# Patient Record
Sex: Female | Born: 1955 | Race: White | Hispanic: No | Smoking: Never smoker
Health system: Southern US, Community
[De-identification: ages and names within clinical notes are randomized; demographics above are authoritative.]

## PROBLEM LIST (undated history)

## (undated) DIAGNOSIS — M810 Age-related osteoporosis without current pathological fracture: Secondary | ICD-10-CM

## (undated) DIAGNOSIS — T8859XA Other complications of anesthesia, initial encounter: Secondary | ICD-10-CM

## (undated) DIAGNOSIS — E890 Postprocedural hypothyroidism: Secondary | ICD-10-CM

## (undated) DIAGNOSIS — M5432 Sciatica, left side: Secondary | ICD-10-CM

## (undated) DIAGNOSIS — E785 Hyperlipidemia, unspecified: Secondary | ICD-10-CM

## (undated) DIAGNOSIS — Z87448 Personal history of other diseases of urinary system: Secondary | ICD-10-CM

## (undated) DIAGNOSIS — M5136 Other intervertebral disc degeneration, lumbar region: Secondary | ICD-10-CM

## (undated) DIAGNOSIS — J302 Other seasonal allergic rhinitis: Secondary | ICD-10-CM

## (undated) DIAGNOSIS — N201 Calculus of ureter: Secondary | ICD-10-CM

## (undated) DIAGNOSIS — Z923 Personal history of irradiation: Secondary | ICD-10-CM

## (undated) DIAGNOSIS — M51369 Other intervertebral disc degeneration, lumbar region without mention of lumbar back pain or lower extremity pain: Secondary | ICD-10-CM

## (undated) DIAGNOSIS — Z86010 Personal history of colonic polyps: Secondary | ICD-10-CM

## (undated) DIAGNOSIS — I10 Essential (primary) hypertension: Secondary | ICD-10-CM

## (undated) DIAGNOSIS — C801 Malignant (primary) neoplasm, unspecified: Secondary | ICD-10-CM

## (undated) DIAGNOSIS — Z8542 Personal history of malignant neoplasm of other parts of uterus: Secondary | ICD-10-CM

## (undated) DIAGNOSIS — T4145XA Adverse effect of unspecified anesthetic, initial encounter: Secondary | ICD-10-CM

## (undated) DIAGNOSIS — Z87442 Personal history of urinary calculi: Secondary | ICD-10-CM

## (undated) DIAGNOSIS — J45909 Unspecified asthma, uncomplicated: Secondary | ICD-10-CM

## (undated) DIAGNOSIS — N289 Disorder of kidney and ureter, unspecified: Secondary | ICD-10-CM

## (undated) HISTORY — PX: TOTAL ABDOMINAL HYSTERECTOMY W/ BILATERAL SALPINGOOPHORECTOMY: SHX83

## (undated) HISTORY — DX: Hyperlipidemia, unspecified: E78.5

## (undated) HISTORY — DX: Personal history of colonic polyps: Z86.010

## (undated) HISTORY — PX: TOTAL THYROIDECTOMY: SHX2547

## (undated) HISTORY — PX: TONSILLECTOMY: SUR1361

## (undated) HISTORY — DX: Other seasonal allergic rhinitis: J30.2

## (undated) HISTORY — DX: Personal history of irradiation: Z92.3

## (undated) HISTORY — PX: THYROIDECTOMY, PARTIAL: SHX18

## (undated) HISTORY — DX: Age-related osteoporosis without current pathological fracture: M81.0

---
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
## 1994-08-03 HISTORY — PX: DILATION AND CURETTAGE OF UTERUS: SHX78

## 2003-05-02 ENCOUNTER — Encounter: Payer: Self-pay | Admitting: Internal Medicine

## 2003-05-02 ENCOUNTER — Ambulatory Visit (HOSPITAL_COMMUNITY): Admission: RE | Admit: 2003-05-02 | Discharge: 2003-05-02 | Payer: Self-pay | Admitting: Internal Medicine

## 2004-07-10 ENCOUNTER — Encounter: Admission: RE | Admit: 2004-07-10 | Discharge: 2004-07-10 | Payer: Self-pay | Admitting: Internal Medicine

## 2008-10-14 ENCOUNTER — Encounter: Payer: Self-pay | Admitting: Emergency Medicine

## 2008-10-15 ENCOUNTER — Encounter (INDEPENDENT_AMBULATORY_CARE_PROVIDER_SITE_OTHER): Payer: Self-pay | Admitting: Obstetrics and Gynecology

## 2008-10-15 ENCOUNTER — Inpatient Hospital Stay (HOSPITAL_COMMUNITY): Admission: AD | Admit: 2008-10-15 | Discharge: 2008-10-15 | Payer: Self-pay | Admitting: Obstetrics & Gynecology

## 2008-11-07 ENCOUNTER — Ambulatory Visit: Admission: RE | Admit: 2008-11-07 | Discharge: 2008-11-07 | Payer: Self-pay | Admitting: Gynecologic Oncology

## 2008-11-27 ENCOUNTER — Encounter: Payer: Self-pay | Admitting: Gynecologic Oncology

## 2008-11-27 ENCOUNTER — Ambulatory Visit (HOSPITAL_COMMUNITY): Admission: RE | Admit: 2008-11-27 | Discharge: 2008-11-29 | Payer: Self-pay | Admitting: Obstetrics & Gynecology

## 2008-12-19 ENCOUNTER — Ambulatory Visit: Admission: RE | Admit: 2008-12-19 | Discharge: 2008-12-19 | Payer: Self-pay | Admitting: Gynecologic Oncology

## 2008-12-21 ENCOUNTER — Ambulatory Visit: Admission: RE | Admit: 2008-12-21 | Discharge: 2009-02-10 | Payer: Self-pay | Admitting: Radiation Oncology

## 2009-03-12 ENCOUNTER — Ambulatory Visit: Admission: RE | Admit: 2009-03-12 | Discharge: 2009-03-12 | Payer: Self-pay | Admitting: Radiation Oncology

## 2009-08-19 ENCOUNTER — Other Ambulatory Visit: Admission: RE | Admit: 2009-08-19 | Discharge: 2009-08-19 | Payer: Self-pay | Admitting: Radiation Oncology

## 2009-08-19 ENCOUNTER — Ambulatory Visit: Admission: RE | Admit: 2009-08-19 | Discharge: 2009-08-19 | Payer: Self-pay | Admitting: Radiation Oncology

## 2009-11-13 ENCOUNTER — Ambulatory Visit: Admission: RE | Admit: 2009-11-13 | Discharge: 2009-11-13 | Payer: Self-pay | Admitting: Gynecologic Oncology

## 2009-11-13 ENCOUNTER — Other Ambulatory Visit: Admission: RE | Admit: 2009-11-13 | Discharge: 2009-11-13 | Payer: Self-pay | Admitting: Gynecologic Oncology

## 2009-11-19 ENCOUNTER — Ambulatory Visit (HOSPITAL_COMMUNITY): Admission: RE | Admit: 2009-11-19 | Discharge: 2009-11-19 | Payer: Self-pay | Admitting: *Deleted

## 2009-11-20 ENCOUNTER — Ambulatory Visit (HOSPITAL_COMMUNITY): Admission: RE | Admit: 2009-11-20 | Discharge: 2009-11-20 | Payer: Self-pay | Admitting: *Deleted

## 2010-03-10 ENCOUNTER — Ambulatory Visit: Admission: RE | Admit: 2010-03-10 | Discharge: 2010-03-10 | Payer: Self-pay | Admitting: Radiation Oncology

## 2010-03-10 ENCOUNTER — Other Ambulatory Visit: Admission: RE | Admit: 2010-03-10 | Discharge: 2010-03-31 | Payer: Self-pay | Admitting: Radiation Oncology

## 2010-06-12 ENCOUNTER — Ambulatory Visit
Admission: RE | Admit: 2010-06-12 | Discharge: 2010-06-12 | Payer: Self-pay | Source: Home / Self Care | Admitting: Gynecologic Oncology

## 2010-09-09 ENCOUNTER — Ambulatory Visit: Payer: Self-pay | Attending: Radiation Oncology | Admitting: Radiation Oncology

## 2010-09-09 ENCOUNTER — Other Ambulatory Visit (HOSPITAL_COMMUNITY)
Admission: RE | Admit: 2010-09-09 | Discharge: 2010-09-09 | Disposition: A | Discharge status: Home / Self Care | Payer: Self-pay | Source: Ambulatory Visit | Attending: Radiation Oncology | Admitting: Radiation Oncology

## 2010-09-09 ENCOUNTER — Ambulatory Visit: Payer: Self-pay | Admitting: Radiation Oncology

## 2010-09-09 ENCOUNTER — Other Ambulatory Visit: Payer: Self-pay | Admitting: Radiation Oncology

## 2010-09-09 DIAGNOSIS — C549 Malignant neoplasm of corpus uteri, unspecified: Secondary | ICD-10-CM | POA: Insufficient documentation

## 2010-09-25 ENCOUNTER — Ambulatory Visit: Payer: Self-pay | Attending: Radiation Oncology | Admitting: Radiation Oncology

## 2010-09-25 DIAGNOSIS — Z923 Personal history of irradiation: Secondary | ICD-10-CM | POA: Insufficient documentation

## 2010-09-25 DIAGNOSIS — R21 Rash and other nonspecific skin eruption: Secondary | ICD-10-CM | POA: Insufficient documentation

## 2010-09-25 DIAGNOSIS — C549 Malignant neoplasm of corpus uteri, unspecified: Secondary | ICD-10-CM | POA: Insufficient documentation

## 2010-09-25 LAB — URINALYSIS, MICROSCOPIC - CHCC
Bilirubin (Urine): NEGATIVE
Glucose: NEGATIVE g/dL
Ketones: NEGATIVE mg/dL
Nitrite: NEGATIVE
Protein: 30 mg/dL
Specific Gravity, Urine: 1.02 (ref 1.003–1.035)
pH: 6 (ref 4.6–8.0)

## 2010-09-27 LAB — URINE CULTURE

## 2010-10-09 ENCOUNTER — Ambulatory Visit: Payer: Self-pay | Attending: Radiation Oncology | Admitting: Radiation Oncology

## 2010-10-21 LAB — CREATININE, SERUM
Creatinine, Ser: 0.96 mg/dL (ref 0.4–1.2)
GFR calc Af Amer: >60 mL/min (ref >60)
GFR calc non Af Amer: >60 mL/min (ref >60)

## 2010-11-12 LAB — COMPREHENSIVE METABOLIC PANEL
ALT: 25 U/L (ref 0–35)
AST: 47 U/L — ABNORMAL HIGH (ref 0–37)
Albumin: 4.2 g/dL (ref 3.5–5.2)
Alkaline Phosphatase: 68 U/L (ref 39–117)
BUN: 10 mg/dL (ref 6–23)
CO2: 27 mEq/L (ref 19–32)
Calcium: 10.5 mg/dL (ref 8.4–10.5)
Chloride: 107 mEq/L (ref 96–112)
Creatinine, Ser: 0.93 mg/dL (ref 0.4–1.2)
GFR calc Af Amer: >60 mL/min (ref >60)
GFR calc non Af Amer: >60 mL/min (ref >60)
Glucose, Bld: 127 mg/dL — ABNORMAL HIGH (ref 70–99)
Potassium: 3.4 mEq/L — ABNORMAL LOW (ref 3.5–5.1)
Sodium: 143 mEq/L (ref 135–145)
Total Bilirubin: 0.6 mg/dL (ref 0.3–1.2)
Total Protein: 6.7 g/dL (ref 6.0–8.3)

## 2010-11-12 LAB — CBC
HCT: 38.2 % (ref 36.0–46.0)
HCT: 39.1 % (ref 36.0–46.0)
HCT: 39.4 % (ref 36.0–46.0)
Hemoglobin: 13 g/dL (ref 12.0–15.0)
Hemoglobin: 13.1 g/dL (ref 12.0–15.0)
Hemoglobin: 13.3 g/dL (ref 12.0–15.0)
MCHC: 33.3 g/dL (ref 30.0–36.0)
MCHC: 34.1 g/dL (ref 30.0–36.0)
MCHC: 34.1 g/dL (ref 30.0–36.0)
MCV: 88.8 fL (ref 78.0–100.0)
MCV: 89.3 fL (ref 78.0–100.0)
MCV: 90.3 fL (ref 78.0–100.0)
Platelets: 251 10*3/uL (ref 150–400)
Platelets: 268 10*3/uL (ref 150–400)
Platelets: 281 10*3/uL (ref 150–400)
RBC: 4.28 MIL/uL (ref 3.87–5.11)
RBC: 4.36 MIL/uL (ref 3.87–5.11)
RBC: 4.4 MIL/uL (ref 3.87–5.11)
RDW: 14 % (ref 11.5–15.5)
RDW: 14.6 % (ref 11.5–15.5)
RDW: 14.8 % (ref 11.5–15.5)
WBC: 10.7 10*3/uL — ABNORMAL HIGH (ref 4.0–10.5)
WBC: 13.8 10*3/uL — ABNORMAL HIGH (ref 4.0–10.5)
WBC: 16 10*3/uL — ABNORMAL HIGH (ref 4.0–10.5)

## 2010-11-12 LAB — BASIC METABOLIC PANEL
BUN: 7 mg/dL (ref 6–23)
CO2: 25 mEq/L (ref 19–32)
Calcium: 10.1 mg/dL (ref 8.4–10.5)
Chloride: 102 mEq/L (ref 96–112)
Creatinine, Ser: 0.87 mg/dL (ref 0.4–1.2)
GFR calc Af Amer: >60 mL/min (ref >60)
GFR calc non Af Amer: >60 mL/min (ref >60)
Glucose, Bld: 122 mg/dL — ABNORMAL HIGH (ref 70–99)
Potassium: 4.1 mEq/L (ref 3.5–5.1)
Sodium: 137 mEq/L (ref 135–145)

## 2010-11-12 LAB — DIFFERENTIAL
Basophils Absolute: 0.1 10*3/uL (ref 0.0–0.1)
Basophils Relative: 1 % (ref 0–1)
Eosinophils Absolute: 0.2 10*3/uL (ref 0.0–0.7)
Eosinophils Relative: 2 % (ref 0–5)
Lymphocytes Relative: 31 % (ref 12–46)
Lymphs Abs: 3.3 10*3/uL (ref 0.7–4.0)
Monocytes Absolute: 0.6 10*3/uL (ref 0.1–1.0)
Monocytes Relative: 6 % (ref 3–12)
Neutro Abs: 6.5 10*3/uL (ref 1.7–7.7)
Neutrophils Relative %: 61 % (ref 43–77)

## 2010-11-12 LAB — TYPE AND SCREEN
ABO/RH(D): B POS
Antibody Screen: NEGATIVE

## 2010-11-12 LAB — ABO/RH: ABO/RH(D): B POS

## 2010-11-13 LAB — CBC
HCT: 32.8 % — ABNORMAL LOW (ref 36.0–46.0)
HCT: 34.9 % — ABNORMAL LOW (ref 36.0–46.0)
HCT: 40.8 % (ref 36.0–46.0)
Hemoglobin: 11 g/dL — ABNORMAL LOW (ref 12.0–15.0)
Hemoglobin: 11.9 g/dL — ABNORMAL LOW (ref 12.0–15.0)
Hemoglobin: 13.7 g/dL (ref 12.0–15.0)
MCHC: 33.6 g/dL (ref 30.0–36.0)
MCHC: 33.7 g/dL (ref 30.0–36.0)
MCHC: 34.1 g/dL (ref 30.0–36.0)
MCV: 89.6 fL (ref 78.0–100.0)
MCV: 90.1 fL (ref 78.0–100.0)
MCV: 90.1 fL (ref 78.0–100.0)
Platelets: 250 10*3/uL (ref 150–400)
Platelets: 258 10*3/uL (ref 150–400)
Platelets: 340 10*3/uL (ref 150–400)
RBC: 3.64 MIL/uL — ABNORMAL LOW (ref 3.87–5.11)
RBC: 3.88 MIL/uL (ref 3.87–5.11)
RBC: 4.55 MIL/uL (ref 3.87–5.11)
RDW: 14.6 % (ref 11.5–15.5)
RDW: 14.8 % (ref 11.5–15.5)
RDW: 14.8 % (ref 11.5–15.5)
WBC: 12.5 10*3/uL — ABNORMAL HIGH (ref 4.0–10.5)
WBC: 12.6 10*3/uL — ABNORMAL HIGH (ref 4.0–10.5)
WBC: 19.4 10*3/uL — ABNORMAL HIGH (ref 4.0–10.5)

## 2010-11-13 LAB — URINALYSIS, ROUTINE W REFLEX MICROSCOPIC
Bilirubin Urine: NEGATIVE
Glucose, UA: NEGATIVE mg/dL
Ketones, ur: NEGATIVE mg/dL
Nitrite: NEGATIVE
Protein, ur: 100 mg/dL — AB
Specific Gravity, Urine: 1.018 (ref 1.005–1.030)
Urobilinogen, UA: 0.2 mg/dL (ref 0.0–1.0)
pH: 7 (ref 5.0–8.0)

## 2010-11-13 LAB — WET PREP, GENITAL
Clue Cells Wet Prep HPF POC: NONE SEEN
Trich, Wet Prep: NONE SEEN
Yeast Wet Prep HPF POC: NONE SEEN

## 2010-11-13 LAB — BASIC METABOLIC PANEL
BUN: 10 mg/dL (ref 6–23)
CO2: 26 mEq/L (ref 19–32)
Calcium: 10.3 mg/dL (ref 8.4–10.5)
Chloride: 105 mEq/L (ref 96–112)
Creatinine, Ser: 0.91 mg/dL (ref 0.4–1.2)
GFR calc Af Amer: >60 mL/min (ref >60)
GFR calc non Af Amer: >60 mL/min (ref >60)
Glucose, Bld: 128 mg/dL — ABNORMAL HIGH (ref 70–99)
Potassium: 3.7 mEq/L (ref 3.5–5.1)
Sodium: 142 mEq/L (ref 135–145)

## 2010-11-13 LAB — URINE MICROSCOPIC-ADD ON

## 2010-11-13 LAB — GC/CHLAMYDIA PROBE AMP, GENITAL
Chlamydia, DNA Probe: NEGATIVE
GC Probe Amp, Genital: NEGATIVE

## 2010-11-13 LAB — TSH: TSH: 92.431 u[IU]/mL — ABNORMAL HIGH (ref 0.350–4.500)

## 2010-11-13 LAB — T4, FREE: Free T4: 0.57 ng/dL — ABNORMAL LOW (ref 0.89–1.80)

## 2010-11-13 LAB — HCG, SERUM, QUALITATIVE: Preg, Serum: NEGATIVE

## 2010-11-13 LAB — T3, FREE: T3, Free: 1.9 pg/mL — ABNORMAL LOW (ref 2.3–4.2)

## 2010-12-16 NOTE — Op Note (Signed)
Sheila Leach, Sheila Leach                  ACCOUNT NO.:  [id number]   MEDICAL RECORD NO.:  [id number]          PATIENT TYPE:  OIB   LOCATION:  69                         FACILITY:  [facility]   PHYSICIAN:  Paola A. Alycia Rossetti, MD    DATE OF BIRTH:  1956-02-24   DATE OF PROCEDURE:  11/27/2008  DATE OF DISCHARGE:                               OPERATIVE REPORT   PREOPERATIVE DIAGNOSIS:  Grade 1 endometrial carcinoma.   POSTOPERATIVE DIAGNOSIS:  Grade 1 endometrial carcinoma.   PROCEDURES:  Total laparoscopic hysterectomy, bilateral salpingo-  oophorectomy, bilateral pelvic lymph node sampling, peritoneal biopsy.   SURGEONS:  Paola A. Alycia Rossetti, M.D. and Agnes Lawrence, M.D.   ASSISTANT:  Caswell Corwin, R.N.   ANESTHESIA:  General.   ANESTHESIOLOGIST:  Dr. Landry Dyke.   ESTIMATED BLOOD LOSS:  150 mL.   URINE OUTPUT:  150 mL.   IV FLUIDS:  2200 mL.   OPERATIVE FINDINGS:  Included inflammatory exudate on the posterior  surface of the uterus with some filmy adhesions to the ovaries and the  rectosigmoid colon.  The rectosigmoid colon was adherent to the left  pelvic sidewall and the ovarian fossa.  Frozen section revealed grade 1  endometrioid adenocarcinoma with 100% myometrial invasion and  angiolymphatic involvement.   SPECIMENS:  Washings, cervix, bilateral tubes and ovaries, uterus, and  bilateral pelvic lymph nodes.   The patient was taken to the operating room and placed in the supine  position.  While she was awake, her arms were tucked at her side with  all appropriate precautions to her comfort level.  SCDs were placed.  General anesthesia was then induced.  Time-out was performed to confirm  the patient, the procedure, antibiotics, and allergy status.  The  abdomen was prepped in the usual fashion.  The perineum and vagina were  prepped in the usual fashion.  A Foley catheter was inserted into the  bladder under sterile conditions.  Sterile speculum was inserted into  the  vagina.  The cervix was grasped with a single-tooth tenaculum.  The  cervix was dilated without difficulty.  The ZUMI made with a medium KOH  ring was placed in the usual fashion.  The patient was then draped.   0.25% Marcaine, total of 7 mL was used.  However, for the supraumbilical  port site, 1 mL of 0.25% Marcaine was injected.  A vertical  supraumbilical incision was made with a knife and using the Optiview  port, we entered the abdominal cavity.  The abdomen was then insufflated  with CO2 gas.  Initially our attempts at Trendelenburg were not  successful due to the significant de-oxygenation and desaturation and  difficulty ventilating the patient.  She was then placed out of  Trendelenburg and placed in the supine position again.  The  Trendelenburg was reversed and then slowly Trendelenburg was re-  instituted.  Bilateral 5-mm ports were placed 2 cm medial to where the  ACIS was felt to be.  We could not palpate it due to morbid obesity.  A  10/12 suprapubic port was then placed in her previous  C-section  incision.  The abdomen was insufflated.  We did not increase peak  pressures over 13 mmHg.  Abdominopelvic washings were obtained.  The  inflammatory reaction as described above was noted.  A peritoneal biopsy  was obtained and sent for frozen section.  It returned as reactive  mesothelium, no evidence of malignancy.   Our attention was first drawn to the left pelvic sidewall.  The adhesive  disease of the rectosigmoid colon to the left pelvic sidewall and the  left ovary was taken down using sharp dissection.  The round ligament  was then transected with monopolar cautery and the anterior and  posterior leaves of the broad ligament were opened.  The ureter was  identified.  A window was made between the IP and the ureter.  The IP  was coagulated and transected with the EnSeal device.  We then  skeletonized the uterine artery and created a bladder flap anteriorly on  the  patient's left side.  The uterine vessels were coagulated, but not  transected.   Our attention was then drawn to the patient's right side.  In a similar  fashion, the round ligament was transected with monopolar cautery and  the anterior and posterior leaves of the broad ligament were opened.  The bladder flap was created anteriorly so we were well below the level  of the KOH ring.  We went posteriorly with the round ligament.  The  ureter was identified.  A window was made between the IP and the ureter.  The IP was coagulated and transected with the EnSeal device.  In a  similar fashion to the patient's left side, we skeletonized the uterine  vessels on the patient's right side.  They were coagulated and  transected with the EnSeal.  There was some bleeding.  Therefore, the  Kleppinger was used for adequate hemostasis.   We then turned our attention again to the left side.  We re-coagulated  the uterine vessels with a Kleppinger and the uterine vessels were  transected and a C-loop was created.  The Pneumo Occluder balloon was  then insufflated.  The colpotomy was created and we continued around the  entire colpotomy ring until the specimen was delivered through the  vagina.  It was sent for frozen section.  The vaginal cuff was closed  using four sutures in figure of fashion of 0 Vicryl on an Endostitch.   The patient's bilateral pelvis, paravesical, and pararectal spaces were  opened in the usual fashion.  Frozen section returned as 100% invasion  with angiolymphatic involvement.  We would not be able to perform the  periaortic lymph nodes due to morbid obesity, difficulty with  Trendelenburg, and visualization.  Therefore, we attempted a pelvic  lymph node sampling.  As stated above, the pelvic spaces were opened.  Our attention was first drawn to the patient's left pelvic sidewall.  The nodal bundle extending over the external iliac artery was taken.  Care was ensured to spare  the genitofemoral nerve.  We continued down  inferiorly just to the level of the bifurcation.  Due to visualization  and lack of Trendelenburg, we could not go up to halfway on the common.  The superior vesical space was then held open with a grasper.  We  continued down to the obturator space.  The obturator nerve was  identified and the nodal bundle superior to this was taken using  monopolar cautery.  The specimen was then placed in an EndoCatch bag and  delivered.  A similar procedure was performed on the patient's right  side.  Hemostasis was obtained with pinpoint cautery.  The abdomen and  pelvis were copiously irrigated.  There was noted to be no active  bleeding.  The pedicle sites were inspected under low flow and there was  no bleeding.   The CO2 gas was removed from the patient's abdomen.  All trocars were  removed.  Trocar, Ray-Tec, and instrument counts were correct.  A deep  suture of 0 Vicryl on a UR6 was used to close the fascia of the  supraumbilical port site.  We reapproximated the deep tissues in the  suprapubic site.  The skin was closed using 4-0 Vicryl in a subcuticular  fashion.  Steri-Strips and Benzoin were applied, as were Band-Aids.   The patient tolerated the procedure well.  The Pneumo Occluder balloon  was removed from the vagina.  We swabbed the vagina and there was no  active bleeding.  She tolerated the procedure well and was taken to the  recovery room in stable condition.  All instrument, needle, and Ray-Tec  counts were correct x2.      Paola A. Alycia Rossetti, MD  Electronically Signed     PAG/MEDQ  D:  11/27/2008  T:  11/27/2008  Job:  [id number]   cc:   Caswell Corwin, R.N.  [address]  [place], [place] [address]   Everett Graff, M.D.  Fax: [phone]   Agnes Lawrence, M.D.  Fax: [phone]

## 2010-12-16 NOTE — Op Note (Signed)
[name], Sheila Leach                  ACCOUNT NO.:  [id number]   MEDICAL RECORD NO.:  [id number]          PATIENT TYPE:  INP   LOCATION:  9309                          FACILITY:  [facility]   PHYSICIAN:  Everett Graff, M.D.   DATE OF BIRTH:  1956-02-03   DATE OF PROCEDURE:  10/15/2008  DATE OF DISCHARGE:  10/15/2008                               OPERATIVE REPORT   PREPROCEDURE DIAGNOSES:  1. Postmenopausal bleeding.  2. Anemia.  3. Hypothyroidism.  4. Hypertension.   POSTPROCEDURE DIAGNOSES:  1. Postmenopausal bleeding.  2. Anemia.  3. Hypothyroidism.  4. Hypertension.   PROCEDURE:  Endometrial biopsy.   PROCEDURE IN DETAIL:  Ms. [name] is a 55 year old who presents with  postmenopausal bleeding and suspicion of precancerous or cancerous  lesions in the endometrium on ultrasound.  The patient was transferred  to [facility] from [place].  After discussing with the  patient, the risks, benefits, and alternatives of an endometrial biopsy,  the patient consented to proceed with an endometrial biopsy for further  diagnosis.  A speculum was placed in the patient's vagina and there was  active mild-to-moderate bleeding noted.  No masses visible.  The cervix  was prepped with a non-Betadine solution as the patient has an IODINE  allergy, and the anterior lip of the cervix was grasped with a single-  tooth tenaculum.  The Pipelle was placed sounding the uterus to  approximately the mass 8.5 to 9 cm and biopsy performed.  I was only  able to do one pass as the patient started to significantly bleed.  Pressure was held and an observation was performed in order to assure  that the patient was not going to continuously actively bleed.  The  bleeding did slow to its previous moderate amount.  The patient was  additionally given Provera in order to help decrease the bleeding.  All  instruments were removed.  A pelvic exam was performed and uterus was  approximately 10 weeks' size and no  palpable masses and mildly tender.  The patient tolerated the procedure well.      Everett Graff, M.D.  Electronically Signed     AR/MEDQ  D:  10/16/2008  T:  10/16/2008  Job:  [id number]

## 2010-12-16 NOTE — Consult Note (Signed)
NAMEGILDA, Sheila Leach                  ACCOUNT NO.:  [id number]   MEDICAL RECORD NO.:  [id number]          PATIENT TYPE:  OUT   LOCATION:  GYN                          FACILITY:  [facility]   PHYSICIAN:  Paola A. Alycia Rossetti, MD    DATE OF BIRTH:  [birth year]   DATE OF CONSULTATION:  12/19/2008  DATE OF DISCHARGE:                                 CONSULTATION   Sheila Leach is a 55 year old who had a preoperative endometrial biopsy  showing grade 1 endometrioid adenocarcinoma after a 5-day episode of  heavy bleeding.  She subsequently was counseled by Dr. Doug Sou to proceed  with laparoscopic surgery.  Subsequently, on November 27, 2008, she  underwent total laparoscopic hysterectomy, bilateral salpingo-  oophorectomy, bilateral pelvic lymph node sampling, and peritoneal  biopsies by myself and Dr. Delsa Sale and findings included  inflammatory exudate on the posterior surface of the uterus with some  filmy adhesions to the ovaries and rectosigmoid colon.  The rectosigmoid  colon was adherent to the left pelvic sidewall and the ovarian fossa.  Frozen section was a grade 1 endometrioid adenocarcinoma with 100%  myometrial invasion and angiolymphatic involvement.  We proceeded with  pelvic lymphadenectomy, did not proceed with periaortic lymphadenectomy,  but due to her significant obesity with a BMI of greater than 41 we  opted to not proceed with a laparotomy for periaortics.  Final pathology  revealed negative washings.  Within the uterus itself, she had a grade 1  endometrioid adenocarcinoma invading into the outer half of the  myometrium 1.4/2.4 cm in thickness.  The cervix was negative, as were  the tubes and ovaries.  The perineal biopsy adhesions revealed benign  fibrotic tissue with hemosiderin deposition.  Zero out of 8 lymph nodes  were involved.  Within the uterus, her risk factors were greater than  50%.  Myometrial invasion was 1.4/2.4 cm.  There was lymphovascular  space involvement.  She  comes in today for her postoperative check.  She  is overall doing fairly well.  She states she is getting stronger and  stronger but is not quite ready to go back to work.  She did have pain  at her right lower quadrant incision for about a week and a half.  She  states she popped one of the stitches.  She also complained of some  fatigue and just not feeling very well with some joint pains after her  Lovenox injections.  She subsequently has stopped her Lovenox injections  and is feeling better.  She denies any vaginal bleeding, any significant  change in her bowel or bladder habits, any nausea or vomiting.  She has  lost approximately 13 pounds compared to her preop visit but she states  that even though she is eating okay her appetite just has not been as  great as it was pre surgery.   PHYSICAL EXAMINATION:  272 pounds, blood pressure 144/98.  Well-  nourished, well-developed female in no acute distress.  ABDOMEN:  She has well-healed laparoscopy skin incisions.  There is very  minimal separation of the right lower quadrant port  site incision.  There is no erythema.  There is no exudate.  There is no induration.  PELVIC:  External genitalia is within normal limits.  The vaginal cuff  is difficult to visualize secondary to habitus and patient's voluntary  guarding, however, the cuff appears to be healing well.  On bimanual  examination, there is no cuff tenderness, fluctuance, or masses.   ASSESSMENT:  A 55 year old with a FIGO IB, new FIGO staging grade 1  endometrioid adenocarcinoma.  Based on risk factors of greater than 50%  myometrial invasion lymphovascular space involvement, I would recommend  that we proceed with vaginal cuff brachytherapy.  Discussed this with  her and she is amenable to proceeding as such.  She is also amenable to  [place] scheduling her an appointment to see Dr. Sondra Come for consideration of  vaginal cuff HDR.  Questions regarding this were elicited and  answered  to her satisfaction.  She does understand that she is not  completely staged as periaortics were not done due to technical  limitations.  We will proceed with a CT scan of the abdomen and pelvis  to rule out any lymphadenopathy higher than the pelvic region as she was  not comprehensively staged.      Paola A. Alycia Rossetti, MD  Electronically Signed     PAG/MEDQ  D:  12/19/2008  T:  12/19/2008  Job:  [id number]   cc:   Caswell Corwin, R.N.  [address]  [place], [place] [address]   Everett Graff, M.D.  Fax: [phone]   Blair Promise, M.D.  Fax: [phone]

## 2010-12-16 NOTE — Consult Note (Signed)
NAMESMT, STIDAM                  ACCOUNT NO.:  [id number]   MEDICAL RECORD NO.:  [id number]          PATIENT TYPE:  OUT   LOCATION:  GYN                          FACILITY:  [facility]   PHYSICIAN:  Janie Morning, MD     DATE OF BIRTH:  07/27/1956   DATE OF CONSULTATION:  11/07/2008  DATE OF DISCHARGE:                                 CONSULTATION   REASON FOR CONSULTATION:  Ms. Rash is referred by Everett Graff, M.D.  for evaluation of endometrial cancer.   HISTORY OF PRESENT ILLNESS:  This is a 55 year old who is menopausal 2  years ago who reported 5 days of heavy vaginal bleeding.  Endometrial  biopsy was performed on October 15, 2008, was consistent with a grade 1  endometrioid adenocarcinoma.  She was placed on adjuvant Provera and  reports only intermittent spotting since.  At this time she denies any  shortness of breath, nausea, vomiting, abdominal pain, fever, chills,  chest pain, headache or cough.  There are no changes in her bowel or  rectal habits.   PAST MEDICAL HISTORY:  Hypertension 2 to 3 years' duration.   PAST SURGICAL HISTORY:  Thyroidectomy at age of 60 because of a goiter.  Cesarean section in 1988 because of an inadequate pelvis and a dilation  and curettage and laparoscopy in 1996.   FAMILY HISTORY:  Notable for a brother with stage IV squamous cell  carcinoma of the throat.   SOCIAL HISTORY:  She has been divorced for over 20 years.  Her company  called [employer] was closed and so she is currently unemployed.  She denies any tobacco or alcohol use.   SCREENING HISTORY:  Mammogram 2 years ago.  Colonoscopy never.   MEDICATIONS:  Unspecified antihypertensive agent.   REVIEW OF SYSTEMS:  10-point review of systems is noteworthy for the  factors noted above.   PHYSICAL EXAMINATION:  VITAL SIGNS:  Weight 283 pounds, height 5 feet 9-  1/2 inches, BMI 41.19, blood pressure 189/107.  GENERAL:  This is a well-developed, very pleasant female in no acute  distress.  CHEST:  Clear to auscultation bilaterally.  BACK:  No CVA tenderness.  LYMPH NODE SURVEY:  No cervical, inguinal or supraclavicular adenopathy.  ABDOMEN:  Soft, obese.  Incision in the periumbilical area.  No palpable  masses.  PELVIC:  The cervix is very well supported with no descensus.  It is  palpably normal.  Trace blood was noted within the vaginal vault.  External genitalia:  There is asymmetry within the labia, namely the  right labia is much larger than the left.  Skin tags are noted on the  buttocks.  RECTAL:  Good anal sphincter tone without any rectal masses.   IMPRESSION:  1. Grade 1 endometrioid adenocarcinoma.  I counseled Ms. Penas that the      next recommended step would be surgical removal of the uterus,      preferably a laparoscopic hysterectomy, bilateral salpingo-      oophorectomy with possible lymph node sampling.  She is aware that  it may require conversion to an open procedure.  The risks and      benefits of the surgery discussed with the patient were that of      infection, bleeding, damage to surrounding structures, prolonged      hospitalization, reoperation.  2. Hypertension.  Ms. Sory did not take her medications this morning      and reports white coat syndrome.  She has been advised to take her      medications as specified.   PLAN:  For surgical staging with Paola A. Alycia Rossetti, MD      Janie Morning, MD  Electronically Signed     WB/MEDQ  D:  11/07/2008  T:  11/07/2008  Job:  [id number]   cc:   Everett Graff, M.D.  Fax: [phone]   Caswell Corwin, R.N.  [address], [place] [address]

## 2010-12-16 NOTE — H&P (Signed)
NAME:  Sheila Leach, Sheila Leach                  ACCOUNT NO.:  [id number]   MEDICAL RECORD NO.:  [id number]           PATIENT TYPE:   LOCATION:                                 FACILITY:   PHYSICIAN:  Everett Graff, M.D.   DATE OF BIRTH:  July 28, 1956   DATE OF ADMISSION:  DATE OF DISCHARGE:                              HISTORY & PHYSICAL   This is a 55 year old gravida 1, para 1, nonpregnant who presents from  the emergency room with vaginal bleeding.  She is 2 years postmenopausal  with a history of 2 episodes of spotting over the past 2 years and the  current episode of heavy bleeding beginning 1 week ago with passage of  clots.  Bleeding is heavier when she is up and moving around.  She does  not have pain with this bleeding.  She does not have fever or other  symptoms.   Her history is remarkable for a goiter and subsequent thyroidectomy.  She also has a history of hypertension and fibroids and is allergic to  IODINE.   PAST MEDICAL HISTORY:  Remarkable for goiter at age 34 with subsequent  thyroidectomy and subsequent hypothyroidism.  She also has a history of  hypertension which has not been treated.  She has a history of multiple  fibroids removed in the past and an allergy of IODINE.  She also has a  history of 1 vaginal delivery with no complications.   PAST SURGICAL HISTORY:  Remarkable for thyroidectomy, myomectomy, and D  and C.   FAMILY HISTORY:  Noncontributory.   GENETIC HISTORY:  Noncontributory.   SOCIAL HISTORY:  The patient lives with son who is supportive.  She does  not report a religious affiliation.  She denies any alcohol, tobacco, or  drug use.   OBJECTIVE DATA:  VITAL SIGNS:  Stable except for blood pressure 146/107.  HEENT:  Within normal limits.  THYROID:  Surgically absent with no nodules.  CHEST:  Clear to auscultation.  HEART:  Regular rate and rhythm.  ABDOMEN:  Soft and nontender.  GU:  Speculum exam shows vaginal bleeding with clots.  PELVIC:  An  enlarged uterus with ovaries not palpable secondary to  habitus.  Cervix is not able to be reached.  EXTREMITIES:  Within normal limits.   CBC is remarkable for hemoglobin earlier in the day of 13.7 with later  measurement of 11.9, and platelets are 258.  Thyroid studies are  pending.  Endometrial biopsy was done and is pending.  Ultrasound was  done in the emergency room showing a thickened endometrium suspicious  for carcinoma of the endometrial lining.   ASSESSMENT:  Abnormal postmenopausal bleeding.   PLAN:  Dr. Mancel Bale plans to admit the patient overnight for control of  her bleeding with progesterone and further orders to follow.      Marie L. Williams, C.N.M.      Everett Graff, M.D.  Electronically Signed    MLW/MEDQ  D:  10/15/2008  T:  10/15/2008  Job:  [id number]

## 2010-12-16 NOTE — Discharge Summary (Signed)
NAMEILLYRIA, FILIPE                  ACCOUNT NO.:  [id number]   MEDICAL RECORD NO.:  [id number]          PATIENT TYPE:  INP   LOCATION:  9309                          FACILITY:  [facility]   PHYSICIAN:  Everett Graff, M.D.   DATE OF BIRTH:  1956-02-17   DATE OF ADMISSION:  10/15/2008  DATE OF DISCHARGE:  10/15/2008                               DISCHARGE SUMMARY   DISCHARGE DIAGNOSES:  Postmenopausal bleeding; endometrioid  adenocarcinoma, FIGO grade I/III.   PROCEDURE:  On the date of admission, the patient underwent an  endometrial biopsy tolerating procedure well.   HISTORY OF PRESENT ILLNESS:  Ms. Beranek is a 55 year old female para 1-0-0-  1, who presented to [facility] Emergency Department for postmenopausal  bleeding with clots of 1-week duration.  The patient was subsequently  transferred to [place] for further evaluation.  Please see the patient's dictated history and physical examination for  details.  Preoperative physical exam, blood pressure 146/107.  General  exam was within normal limits, though now patient's thyroid was  surgically absent and there were no palpable nodules.  Pelvic exam  revealed via speculum, vaginal bleeding with clots, uterus appeared  enlarged with ovaries which were not palpable due to the patient's body  habitus.  Cervix also was not able to be reached.   HOSPITAL COURSE:  On the day of admission, the patient underwent  aforementioned procedure tolerating it well.  Post-procedure hemoglobin  was 11 and hematocrit 32.8 (pre-procedure hemoglobin 11.9 and hematocrit  34.9).  The patient was discharged following procedure to home in good  condition and advised to follow up with Dr. Mancel Bale in 2 weeks.   DISCHARGE MEDICATIONS:  1. Provera 10 mg 1 tablet twice daily.  2. Iron 325 mg 1 tablet twice daily.   FOLLOWUP:  The patient is to call [facility] to schedule a  2 weeks followup with Dr. Mancel Bale at [phone].   DISCHARGE  INSTRUCTIONS:  The patient was advised to call us for any  problems.   ACTIVITY:  Without restrictions.   DIET:  Low-sodium, low-carbohydrate diet.   FINAL PATHOLOGY:  Endometrium, biopsy:  Endometrioid adenocarcinoma FIGO  grade I/III.      [place] J. Elizebeth Koller.      Everett Graff, M.D.  Electronically Signed    EJP/MEDQ  D:  11/02/2008  T:  11/02/2008  Job:  [id number]

## 2010-12-19 NOTE — Discharge Summary (Signed)
Sheila Leach, Sheila Leach                  ACCOUNT NO.:  [id number]   MEDICAL RECORD NO.:  [id number]          PATIENT TYPE:  OIB   LOCATION:  [id number]                         FACILITY:  [facility]   PHYSICIAN:  Agnes Lawrence, M.D.DATE OF BIRTH:  05/23/1956   DATE OF ADMISSION:  11/27/2008  DATE OF DISCHARGE:  11/29/2008                               DISCHARGE SUMMARY   CHIEF COMPLAINT:  The patient is a 55 year old who presents for  operative management of a recently diagnosed endometrial cancer.  Please  see the dictated history and physical as per Dr. Janie Morning for  further details.   HOSPITAL COURSE:  The the patient was admitted and underwent a total  laparoscopic hysterectomy, bilateral salpingo-oophorectomy, bilateral  pelvic lymph node sampling, and peritoneal biopsy.  Please see the  dictated operative summary.  The patient's postoperative course was  uneventful.  She was tolerating a regular diet on postoperative day 1.  Her labs on postoperative day 1 were normal including a basic metabolic  profile and a hemoglobin of 13.  As the patient was under insured and  had limited financial resources, the discharge was deferred until  postoperative day 2 until a care coordinator arranged for the  perioperative Lovenox for DVT prophylaxis to be continued on an  outpatient basis.   DISCHARGE DIAGNOSES:  Stage I B endometrioid carcinoma.  The Federation  of International of Gynecologists and Obstetricians grade for the tumor  is a grade 1.   PROCEDURES:  Total laparoscopic hysterectomy, bilateral salpingo-  oophorectomy, bilateral pelvic lymph node sampling and peritoneal  biopsy.   CONDITION:  Stable.   DIET:  Regular.   ACTIVITY:  Pelvic rest, progressive activity.   MEDICATIONS:  Percocet 1-2 tablets every 6 hours as needed, Lovenox 60  mg subcu daily for 3 weeks.   DISPOSITION:  The patient is to follow up in the GYN oncology office.      Agnes Lawrence, M.D.  Electronically Signed     LAJ/MEDQ  D:  11/30/2008  T:  11/30/2008  Job:  [id number]   cc:   Caswell Corwin, R.N.  [address]  [place], [place] [address]   Everett Graff, M.D.  Fax: [phone]

## 2011-03-16 ENCOUNTER — Ambulatory Visit
Admission: RE | Admit: 2011-03-16 | Discharge: 2011-03-16 | Disposition: A | Discharge status: Home / Self Care | Payer: Self-pay | Source: Ambulatory Visit | Attending: Radiation Oncology | Admitting: Radiation Oncology

## 2011-03-17 ENCOUNTER — Other Ambulatory Visit: Payer: Self-pay | Admitting: Radiation Oncology

## 2011-03-17 ENCOUNTER — Other Ambulatory Visit (HOSPITAL_COMMUNITY)
Admission: RE | Admit: 2011-03-17 | Discharge: 2011-03-17 | Disposition: A | Discharge status: Home / Self Care | Payer: Self-pay | Source: Ambulatory Visit | Attending: Radiation Oncology | Admitting: Radiation Oncology

## 2011-03-17 DIAGNOSIS — C549 Malignant neoplasm of corpus uteri, unspecified: Secondary | ICD-10-CM | POA: Insufficient documentation

## 2012-03-02 ENCOUNTER — Ambulatory Visit
Admission: RE | Admit: 2012-03-02 | Discharge: 2012-03-02 | Disposition: A | Discharge status: Home / Self Care | Payer: Medicare Other | Source: Ambulatory Visit | Attending: Radiation Oncology | Admitting: Radiation Oncology

## 2012-03-02 ENCOUNTER — Encounter: Payer: Self-pay | Admitting: Radiation Oncology

## 2012-03-02 ENCOUNTER — Other Ambulatory Visit (HOSPITAL_COMMUNITY)
Admission: RE | Admit: 2012-03-02 | Discharge: 2012-03-02 | Disposition: A | Discharge status: Home / Self Care | Payer: Medicare Other | Source: Ambulatory Visit | Attending: Radiation Oncology | Admitting: Radiation Oncology

## 2012-03-02 VITALS — BP 135/96 | HR 54 | Temp 98.0°F | Resp 18 | Wt 274.9 lb

## 2012-03-02 DIAGNOSIS — C549 Malignant neoplasm of corpus uteri, unspecified: Secondary | ICD-10-CM | POA: Insufficient documentation

## 2012-03-02 DIAGNOSIS — Z124 Encounter for screening for malignant neoplasm of cervix: Secondary | ICD-10-CM | POA: Insufficient documentation

## 2012-03-02 HISTORY — DX: Malignant (primary) neoplasm, unspecified: C80.1

## 2012-03-02 HISTORY — DX: Unspecified asthma, uncomplicated: J45.909

## 2012-03-02 HISTORY — DX: Essential (primary) hypertension: I10

## 2012-03-02 NOTE — Progress Notes (Signed)
  Radiation Oncology         [phone] ________________________________  Name: Sheila Leach MRN: [id number]  Date: 03/02/2012  DOB: [birth year]  Follow-Up Visit Note  CC: No primary provider on file.  No ref. provider found  Diagnosis:   Stage IB endometrioid adenocarcinoma  Interval Since Last Radiation: 3 years, the patient completed intracavitary brachii therapy alone as part of her management.  Narrative:  The patient returns today for routine follow-up.  She has been placed on permanent disability related to her back problems. She missed her followup appointment in the winter with Dr. Alycia Rossetti. I recommend she follow with Dr. Alycia Rossetti in approximately 6 months.  She continues to use her vaginal dilator. She denies any vaginal bleeding or pelvic pain. The patient occasionally will have discomfort in the right groin area which is been a chronic problem for her.                              ALLERGIES:   has no allergies on file.  Meds: Current Outpatient Prescriptions  Medication Sig Dispense Refill  . aspirin 81 MG chewable tablet Chew 81 mg by mouth daily.      Marland Kitchen levothyroxine (SYNTHROID, LEVOTHROID) 200 MCG tablet Take 200 mcg by mouth daily.      Marland Kitchen lisinopril (PRINIVIL,ZESTRIL) 40 MG tablet Take 40 mg by mouth daily.      . Multiple Vitamins-Minerals (MULTIVITAMIN WITH MINERALS) tablet Take 1 tablet by mouth daily.        Physical Findings: The patient is in no acute distress. Patient is alert and oriented.  weight is 274 lb 14.4 oz (124.694 kg). Her oral temperature is 98 F (36.7 C). Her blood pressure is 135/96 and her pulse is 54. Her respiration is 18. Marland Kitchen  No palpable cervical supraclavicular or axillary adenopathy. The lungs are clear to auscultation. The heart has a regular rhythm and rate. The abdomen is soft and nontender with normal bowel sounds. There is no inguinal adenopathy appreciated.  On pelvic examination the external genitalia are unremarkable. A small speculum is  used for the exam. The vaginal vault is quite narrowed and elongated taking exam difficult. A Pap smear was obtained of the proximal vagina. There are no mucosal lesions noted on exam. On bimanual and rectovaginal examination there are no obvious pelvic masses although exam is compromised in light of the patient's body habitus.  Lab Findings: Lab Results  Component Value Date   WBC 13.8* 11/28/2008   HGB 13.0 11/28/2008   HCT 38.2 11/28/2008   MCV 89.3 11/28/2008   PLT 281 11/28/2008    @LASTCHEM @  Radiographic Findings: No results found.  Impression:  Stage IB endometrioid adenocarcinoma. No evidence of recurrence on clinical exam today, Pap smear pending.  Plan:  Routine followup in one year. As above the patient will be seen in gynecologic oncology in 6 months.  _____________________________________   Blair Promise, PhD, MD

## 2012-03-02 NOTE — Progress Notes (Signed)
HERE TODAY FOR FU OF ENDOMETRIAL CANCER.  HAS PAIN AT LYMPH NODE EXCISION SITE ONCE IN A WHILE.  NO OTHER C/O TODAY.  LAST PAP WAS IN AUGUST 2012

## 2012-03-14 ENCOUNTER — Ambulatory Visit: Payer: Self-pay | Admitting: Radiation Oncology

## 2012-11-28 ENCOUNTER — Other Ambulatory Visit: Payer: Self-pay | Admitting: Internal Medicine

## 2012-11-28 ENCOUNTER — Encounter: Payer: Self-pay | Admitting: Internal Medicine

## 2012-11-28 DIAGNOSIS — Z1231 Encounter for screening mammogram for malignant neoplasm of breast: Secondary | ICD-10-CM

## 2012-12-06 ENCOUNTER — Ambulatory Visit: Payer: Medicare Other

## 2012-12-16 ENCOUNTER — Ambulatory Visit
Admission: RE | Admit: 2012-12-16 | Discharge: 2012-12-16 | Disposition: A | Discharge status: Home / Self Care | Payer: Medicare Other | Source: Ambulatory Visit | Attending: Internal Medicine | Admitting: Internal Medicine

## 2012-12-16 DIAGNOSIS — Z1231 Encounter for screening mammogram for malignant neoplasm of breast: Secondary | ICD-10-CM

## 2013-01-03 ENCOUNTER — Encounter: Payer: Self-pay | Admitting: Internal Medicine

## 2013-01-03 ENCOUNTER — Ambulatory Visit (AMBULATORY_SURGERY_CENTER): Payer: Medicare Other | Admitting: *Deleted

## 2013-01-03 VITALS — Ht 68.0 in | Wt 275.6 lb

## 2013-01-03 DIAGNOSIS — Z1211 Encounter for screening for malignant neoplasm of colon: Secondary | ICD-10-CM

## 2013-01-03 MED ORDER — NA SULFATE-K SULFATE-MG SULF 17.5-3.13-1.6 GM/177ML PO SOLN: 1.0000 | Freq: Once | ORAL | Status: DC

## 2013-01-03 NOTE — Progress Notes (Signed)
Denies allergies to eggs or soy products. Pt states that she had respiratory difficulty with hysterectomy, asthma type symptoms that were difficult to treat. States she has inhalers and I will encourage her to bring them when she comes. Denies difficulty with sedation.

## 2013-01-17 ENCOUNTER — Ambulatory Visit (AMBULATORY_SURGERY_CENTER): Payer: Medicare Other | Admitting: Internal Medicine

## 2013-01-17 ENCOUNTER — Encounter: Payer: Self-pay | Admitting: Internal Medicine

## 2013-01-17 VITALS — BP 135/95 | HR 84 | Temp 98.3°F | Resp 18 | Ht 68.0 in | Wt 275.0 lb

## 2013-01-17 DIAGNOSIS — K5730 Diverticulosis of large intestine without perforation or abscess without bleeding: Secondary | ICD-10-CM

## 2013-01-17 DIAGNOSIS — Z1211 Encounter for screening for malignant neoplasm of colon: Secondary | ICD-10-CM

## 2013-01-17 DIAGNOSIS — D126 Benign neoplasm of colon, unspecified: Secondary | ICD-10-CM

## 2013-01-17 MED ORDER — SODIUM CHLORIDE 0.9 % IV SOLN: 500.0000 mL | INTRAVENOUS | Status: DC

## 2013-01-17 NOTE — Progress Notes (Signed)
Patient did not experience any of the following events: a burn prior to discharge; a fall within the facility; wrong site/side/patient/procedure/implant event; or a hospital transfer or hospital admission upon discharge from the facility. (G8907)Patient did not have preoperative order for IV antibiotic SSI prophylaxis. [id number]  Pt states she has a hard time passing gas at home, she has only passed gas once while in recovery, pt denies pain and cramping, have encouraged pt to bare down and push air out, abd is soft non-distended and non-tender, advised pt that gas can be very painful if she does not get it out, pt states she does not feel like she needs to pass any gas  Advised pt to drink warm liquids, walk at home, if laying down changing positions to help pass gas

## 2013-01-17 NOTE — Progress Notes (Signed)
Lidocaine-40mg IV prior to Propofol InductionPropofol given over incremental dosages 

## 2013-01-17 NOTE — Patient Instructions (Addendum)
I found and removed 10 polyps today. They all look benign. You also have diverticulosis - not usually a problem.  I will let you know pathology results and when to have another routine colonoscopy by mail.  I appreciate the opportunity to care for you. Gatha Mayer, MD, FACG  YOU HAD AN ENDOSCOPIC PROCEDURE TODAY AT [facility]: Refer to the procedure report that was given to you for any specific questions about what was found during the examination.  If the procedure report does not answer your questions, please call your gastroenterologist to clarify.  If you requested that your care partner not be given the details of your procedure findings, then the procedure report has been included in a sealed envelope for you to review at your convenience later.  YOU SHOULD EXPECT: Some feelings of bloating in the abdomen. Passage of more gas than usual.  Walking can help get rid of the air that was put into your GI tract during the procedure and reduce the bloating. If you had a lower endoscopy (such as a colonoscopy or flexible sigmoidoscopy) you may notice spotting of blood in your stool or on the toilet paper. If you underwent a bowel prep for your procedure, then you may not have a normal bowel movement for a few days.  DIET: Your first meal following the procedure should be a light meal and then it is ok to progress to your normal diet.  A half-sandwich or bowl of soup is an example of a good first meal.  Heavy or fried foods are harder to digest and may make you feel nauseous or bloated.  Likewise meals heavy in dairy and vegetables can cause extra gas to form and this can also increase the bloating.  Drink plenty of fluids but you should avoid alcoholic beverages for 24 hours.  ACTIVITY: Your care partner should take you home directly after the procedure.  You should plan to take it easy, moving slowly for the rest of the day.  You can resume normal activity the day after the  procedure however you should NOT DRIVE or use heavy machinery for 24 hours (because of the sedation medicines used during the test).    SYMPTOMS TO REPORT IMMEDIATELY: A gastroenterologist can be reached at any hour.  During normal business hours, 8:30 AM to 5:00 PM Monday through Friday, call [phone].  After hours and on weekends, please call the GI answering service at [phone] who will take a message and have the physician on call contact you.   Following lower endoscopy (colonoscopy or flexible sigmoidoscopy):  Excessive amounts of blood in the stool  Significant tenderness or worsening of abdominal pains  Swelling of the abdomen that is new, acute  Fever of 100F or higher  FOLLOW UP: If any biopsies were taken you will be contacted by phone or by letter within the next 1-3 weeks.  Call your gastroenterologist if you have not heard about the biopsies in 3 weeks.  Our staff will call the home number listed on your records the next business day following your procedure to check on you and address any questions or concerns that you may have at that time regarding the information given to you following your procedure. This is a courtesy call and so if there is no answer at the home number and we have not heard from you through the emergency physician on call, we will assume that you have returned to your regular daily activities  without incident.  SIGNATURES/CONFIDENTIALITY: You and/or your care partner have signed paperwork which will be entered into your electronic medical record.  These signatures attest to the fact that that the information above on your After Visit Summary has been reviewed and is understood.  Full responsibility of the confidentiality of this discharge information lies with you and/or your care-partner.  Repeat colonoscopy will be determined by pathology  Polyp, Diverticulosis-handout given

## 2013-01-17 NOTE — Op Note (Signed)
[address]  [address] [place], [address]   COLONOSCOPY PROCEDURE REPORT  PATIENT: Sheila, Leach  MR#: [id number] BIRTHDATE: 02/22/1956 , 21  yrs. old GENDER: Female ENDOSCOPIST: Gatha Mayer, MD, [facility] REFERRED [id number] Virgina Jock, M.D. PROCEDURE DATE:  01/17/2013 PROCEDURE:   Colonoscopy with biopsy and snare polypectomy ASA CLASS:   Class II INDICATIONS:average risk screening and first colonoscopy. MEDICATIONS: propofol (Diprivan) 350mg  IV, MAC sedation, administered by CRNA, and These medications were titrated to patient response per physician's verbal order  DESCRIPTION OF PROCEDURE:   After the risks benefits and alternatives of the procedure were thoroughly explained, informed consent was obtained.  A digital rectal exam revealed no abnormalities of the rectum.   The [id number]  endoscope was introduced through the anus and advanced to the cecum, which was identified by both the appendix and ileocecal valve. No adverse events experienced.   The quality of the prep was excellent using Suprep  The instrument was then slowly withdrawn as the colon was fully examined.      COLON FINDINGS: Ten sessile polyps measuring 3-10 mm in size were found at the cecum (1), in the ascending colon (1), at the hepatic flexure (2), in the transverse colon (2), splenic flexure (2)  and descending colon (2).  A polypectomy was performed with cold forceps (cecum - 3 mm)and with a cold snare (all others).  The resection was complete and the polyp tissue was completely retrieved.   Mild diverticulosis was noted in the sigmoid colon. The colon mucosa was otherwise normal.   A right colon retroflexion was performed.  Retroflexed views revealed no abnormalities. The time to cecum=4 minutes 08 seconds.  Withdrawal time=24 minutes 40 seconds.  The scope was withdrawn and the procedure completed. COMPLICATIONS: There were no complications.  ENDOSCOPIC IMPRESSION: 1.   Ten  sessile polyps measuring 3-10 mm in size were found at the cecum, in the ascending colon, at the hepatic flexure, in the transverse colon, splenic flexure and descending colon; polypectomy was performed with cold forceps and with a cold snare 2.   Mild diverticulosis was noted in the sigmoid colon 3.   The colon mucosa was otherwise normal - excellent prep  RECOMMENDATIONS: Timing of repeat colonoscopy will be determined by pathology findings.   eSigned:  Gatha Mayer, MD, [facility] 01/17/2013 10:30 AM   cc: Shon Baton, MD and The Patient   PATIENT NAME:  Sheila, Leach MR#: [id number]

## 2013-01-18 ENCOUNTER — Telehealth: Payer: Self-pay | Admitting: *Deleted

## 2013-01-18 NOTE — Telephone Encounter (Signed)
No answer, message left for patient. 

## 2013-01-24 ENCOUNTER — Encounter: Payer: Self-pay | Admitting: Internal Medicine

## 2013-01-24 DIAGNOSIS — Z860100 Personal history of colon polyps, unspecified: Secondary | ICD-10-CM | POA: Insufficient documentation

## 2013-01-24 HISTORY — DX: Personal history of colon polyps, unspecified: Z86.0100

## 2013-01-24 HISTORY — DX: Personal history of colonic polyps: Z86.010

## 2013-01-24 NOTE — Progress Notes (Signed)
Quick Note:  3 adenomas and 1 ssa Other 6 polyps inflammatory Repeat colonoscopy about 01/2016 ______

## 2013-02-24 ENCOUNTER — Encounter: Payer: Self-pay | Admitting: Radiation Oncology

## 2013-02-24 DIAGNOSIS — C541 Malignant neoplasm of endometrium: Secondary | ICD-10-CM | POA: Insufficient documentation

## 2013-02-27 ENCOUNTER — Other Ambulatory Visit (HOSPITAL_COMMUNITY)
Admission: RE | Admit: 2013-02-27 | Discharge: 2013-02-27 | Disposition: A | Discharge status: Home / Self Care | Payer: Medicare Other | Source: Ambulatory Visit | Attending: Radiation Oncology | Admitting: Radiation Oncology

## 2013-02-27 ENCOUNTER — Ambulatory Visit
Admission: RE | Admit: 2013-02-27 | Discharge: 2013-02-27 | Disposition: A | Discharge status: Home / Self Care | Payer: Medicare Other | Source: Ambulatory Visit | Attending: Radiation Oncology | Admitting: Radiation Oncology

## 2013-02-27 ENCOUNTER — Encounter: Payer: Self-pay | Admitting: Radiation Oncology

## 2013-02-27 ENCOUNTER — Telehealth: Payer: Self-pay | Admitting: *Deleted

## 2013-02-27 ENCOUNTER — Ambulatory Visit: Payer: Medicare Other | Admitting: Radiation Oncology

## 2013-02-27 VITALS — BP 116/82 | HR 103 | Temp 98.3°F | Resp 20 | Wt 278.4 lb

## 2013-02-27 DIAGNOSIS — Z01419 Encounter for gynecological examination (general) (routine) without abnormal findings: Secondary | ICD-10-CM | POA: Insufficient documentation

## 2013-02-27 DIAGNOSIS — C541 Malignant neoplasm of endometrium: Secondary | ICD-10-CM

## 2013-02-27 NOTE — Progress Notes (Signed)
  Radiation Oncology         [phone]) [phone] ________________________________  Name: Sheila Leach MRN: [id number]  Date: 02/27/2013  DOB: 08/24/1955  Follow-Up Visit Note  CC: Precious Reel, MD  Sherol Dade., MD  Diagnosis:   Stage IB endometrioid adenocarcinoma   Interval Since Last Radiation:  4  Years  Narrative:  The patient returns today for routine follow-up.  She completed intracavitary brachytherapy treatments alone as part of her management. Patient is doing well except for chronic pain in her left leg. She denies any vaginal bleeding or discharge. Patient denies any hematuria or rectal bleeding.                              ALLERGIES:  is allergic to iodine.  Meds: Current Outpatient Prescriptions  Medication Sig Dispense Refill  . acetaminophen (TYLENOL) 325 MG tablet Take 650 mg by mouth every 6 (six) hours as needed.      Marland Kitchen alendronate (FOSAMAX) 70 MG tablet       . Ascorbic Acid (VITAMIN C) 1000 MG tablet Take 1,000 mg by mouth daily.      Marland Kitchen atorvastatin (LIPITOR) 20 MG tablet Take 20 mg by mouth daily.      . cetirizine (ZYRTEC) 10 MG tablet Take 10 mg by mouth daily.      Marland Kitchen ibuprofen (ADVIL,MOTRIN) 200 MG tablet Take 200 mg by mouth every 6 (six) hours as needed.      Marland Kitchen levothyroxine (SYNTHROID, LEVOTHROID) 200 MCG tablet Take 200 mcg by mouth daily.      Marland Kitchen lisinopril (PRINIVIL,ZESTRIL) 40 MG tablet Take 40 mg by mouth daily.      . Multiple Vitamins-Minerals (MULTIVITAMIN WITH MINERALS) tablet Take 1 tablet by mouth daily.       No current facility-administered medications for this encounter.    Physical Findings: The patient is in no acute distress. Patient is alert and oriented.  weight is 278 lb 6.4 oz (126.281 kg). Her oral temperature is 98.3 F (36.8 C). Her blood pressure is 116/82 and her pulse is 103. Her respiration is 20. Marland Kitchen  No palpable supraclavicular or axillary adenopathy.  The lungs are clear. The heart has regular rhythm and rate. The abdomen is soft  and nontender with normal bowel sounds. The inguinal areas are free of adenopathy. A pelvic examination the external genitalia are unremarkable. A speculum exam is performed. The vaginal cuff is difficult to visualize in light of patient's body habitus but no obvious lesion. A Pap smear was obtained of the proximal vagina. On bimanual and rectovaginal examination there no obvious pelvic masses    Impression: No evidence of recurrence on clinical exam today, Pap smear pending  Plan:  Routine followup in one year. The patient be seen by gynecologic oncology in 6 months  _____________________________________  -----------------------------------  Blair Promise, PhD, MD

## 2013-02-27 NOTE — Progress Notes (Signed)
Pt denies pain other than her left leg pain which is chronic. She takes Advil prn w/good relief, uses a cane. Pt denies urinary, bowel issues, vaginal discharge, fatigue, loss of appetite. She has not seen GYN dr in the past year. She has not used vaginal dilator in several months.

## 2013-02-27 NOTE — Telephone Encounter (Signed)
Spoke w/pt re: moving her FU to earlier appt today. Pt states she has a ride for this afternoon and prefers to keep that appointment.

## 2013-03-06 ENCOUNTER — Telehealth: Payer: Self-pay | Admitting: *Deleted

## 2013-03-06 NOTE — Telephone Encounter (Signed)
Per Dr Sondra Come, spoke w/pt and informed her 02/27/13 pap smear results "are good". Pt verbalized understanding.

## 2013-08-02 ENCOUNTER — Encounter: Payer: Self-pay | Admitting: Gynecologic Oncology

## 2013-08-02 ENCOUNTER — Ambulatory Visit: Payer: Medicare Other | Attending: Gynecologic Oncology | Admitting: Gynecologic Oncology

## 2013-08-02 VITALS — BP 157/103 | HR 115 | Temp 98.1°F | Resp 16 | Ht 68.0 in | Wt 280.9 lb

## 2013-08-02 DIAGNOSIS — C541 Malignant neoplasm of endometrium: Secondary | ICD-10-CM

## 2013-08-02 DIAGNOSIS — Z79899 Other long term (current) drug therapy: Secondary | ICD-10-CM | POA: Insufficient documentation

## 2013-08-02 DIAGNOSIS — M899 Disorder of bone, unspecified: Secondary | ICD-10-CM | POA: Insufficient documentation

## 2013-08-02 DIAGNOSIS — M810 Age-related osteoporosis without current pathological fracture: Secondary | ICD-10-CM | POA: Insufficient documentation

## 2013-08-02 DIAGNOSIS — J45909 Unspecified asthma, uncomplicated: Secondary | ICD-10-CM | POA: Insufficient documentation

## 2013-08-02 DIAGNOSIS — E079 Disorder of thyroid, unspecified: Secondary | ICD-10-CM | POA: Insufficient documentation

## 2013-08-02 DIAGNOSIS — Z923 Personal history of irradiation: Secondary | ICD-10-CM | POA: Insufficient documentation

## 2013-08-02 DIAGNOSIS — I10 Essential (primary) hypertension: Secondary | ICD-10-CM | POA: Insufficient documentation

## 2013-08-02 DIAGNOSIS — Z9079 Acquired absence of other genital organ(s): Secondary | ICD-10-CM | POA: Insufficient documentation

## 2013-08-02 DIAGNOSIS — Z90710 Acquired absence of both cervix and uterus: Secondary | ICD-10-CM | POA: Insufficient documentation

## 2013-08-02 DIAGNOSIS — C549 Malignant neoplasm of corpus uteri, unspecified: Secondary | ICD-10-CM | POA: Insufficient documentation

## 2013-08-02 DIAGNOSIS — E785 Hyperlipidemia, unspecified: Secondary | ICD-10-CM | POA: Insufficient documentation

## 2013-08-02 NOTE — Progress Notes (Signed)
Consult Note: Gyn-Onc  Sheila Leach 57 y.o. female  CC:  Chief Complaint  Patient presents with  . Endometrial cancer    follow up    HPI: Patient is a 57 year old in April 2010 underwent a laparoscopic hysterectomy, BSO and bilateral pelvic lymph node sampling. Pathology revealed a stage IB (1.4/2.4 cm) grade 1 endometrioid adenocarcinoma. She had positive lymphovascular space involvement. She had negative lymph nodes and negative washings. She underwent postoperative vaginal cuff brachii therapy under the care of Dr. Sondra Come.  I last saw her 06/12/2010. We have not seen her in our clinic since that time. She was seen by radiation oncology 02/27/2013 and had a negative exam and negative Pap smear at that time. They scheduled her for followup with [place].  She's overall doing fairly well and has been since we saw her. 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 She is a current disc as well as a compression fracture the disc. Her pain is not worse. She does have osteopenia and osteoporosis. She's recently been started on weekly Fosamax and vitamin D. She is up-to-date on her mammograms having had one in May of 2014. She had a colonoscopy in June of 2014 and had several benign polyps. She needs repeat colonoscopy in 3 years.  Interval History:  As above.  There are no new medical problems and her family. She's not had any other surgical procedures since we last saw her other than the mammogram in the colonoscopy. She is currently on disability for her back pain. She continues to be followed by Dr.Russo her primary care physician.  Review of Systems  Constitutional: Denies fever. Skin: No rash, sores, jaundice, itching, or dryness.  Cardiovascular: No chest pain, shortness of breath, or edema  Pulmonary: No cough or wheeze.  Gastro Intestinal: No nausea, vomiting, constipation, or diarrhea reported. No bright red blood per rectum or change  in bowel movement.  Genitourinary: No frequency, urgency, or dysuria.  Denies vaginal bleeding and discharge.  Musculoskeletal: As above Neurologic: No changes Psychology: No changes  Current Meds:  Outpatient Encounter Prescriptions as of 08/02/2013  Medication Sig  . albuterol (PROVENTIL HFA;VENTOLIN HFA) 108 (90 BASE) MCG/ACT inhaler Inhale 2 puffs into the lungs every 6 (six) hours as needed for wheezing or shortness of breath.  Marland Kitchen alendronate (FOSAMAX) 70 MG tablet   . Ascorbic Acid (VITAMIN C) 1000 MG tablet Take 1,000 mg by mouth daily.  Marland Kitchen atorvastatin (LIPITOR) 20 MG tablet Take 20 mg by mouth daily.  . cetirizine (ZYRTEC) 10 MG tablet Take 10 mg by mouth daily.  Marland Kitchen ibuprofen (ADVIL,MOTRIN) 200 MG tablet Take 200 mg by mouth every 6 (six) hours as needed.  Marland Kitchen levothyroxine (SYNTHROID, LEVOTHROID) 200 MCG tablet Take 200 mcg by mouth daily.  Marland Kitchen lisinopril (PRINIVIL,ZESTRIL) 40 MG tablet Take 40 mg by mouth daily.  . Multiple Vitamins-Minerals (MULTIVITAMIN WITH MINERALS) tablet Take 1 tablet by mouth daily.  Marland Kitchen acetaminophen (TYLENOL) 325 MG tablet Take 650 mg by mouth every 6 (six) hours as needed.    Allergy:  Allergies  Allergen Reactions  . Iodine Swelling    Face and lips     Social Hx:   History   Social History  . Marital Status: Divorced    Spouse Name: N/A    Number of Children: N/A  . Years of Education: N/A   Occupational History  . Not on file.   Social History Main Topics  .  Smoking status: Never Smoker   . Smokeless tobacco: Never Used  . Alcohol Use: No  . Drug Use: No  . Sexual Activity: Not on file   Other Topics Concern  . Not on file   Social History Narrative  . No narrative on file    Past Surgical Hx:  Past Surgical History  Procedure Laterality Date  . Abdominal hysterectomy    . Dilation and curettage of uterus      fibroids  . Thyroidectomy      childhood    Past Medical Hx:  Past Medical History  Diagnosis Date  . Thyroid  disease   . Asthma   . Hypertension   . Cancer   . Endometrial cancer   . Back injury   . Seasonal allergies   . Arthritis     back  . Hyperlipidemia   . Personal history of colonic adenomas 01/24/2013  . Hx of radiation therapy 6/6/, 6/21, 6/24, 02/07/2009    brachytherapy    Oncology Hx:    Endometrial cancer   11/13/2008 Surgery IB, Grade 1. 1.4/2.4 cm, + LVSI    - 01/30/2009 Radiation Therapy     Family Hx:  Family History  Problem Relation Age of Onset  . Colon cancer Neg Hx   . Rectal cancer Neg Hx   . Stomach cancer Neg Hx   . Heart disease Mother   . Diabetes Mother   . Kidney disease Mother   . Heart disease Father   . Thyroid cancer Sister   . Esophageal cancer Brother   . Diabetes Brother   . Liver disease Brother     Vitals:  Blood pressure 157/103, pulse 115, temperature 98.1 F (36.7 C), temperature source Oral, resp. rate 16, height 5\' 8"  (1.727 m), weight 280 lb 14.4 oz (127.415 kg).  Physical Exam:  Well-nourished well-developed female appearing older than stated age in no acute distress.  Neck: Supple, no lymphadenopathy no thyromegaly.  Lungs: Clear to auscultation bilaterally.  Cardiovascular: Regular rate and rhythm.  Abdomen: Morbidly obese, soft, nontender, nondistended. There is no palpable masses or hepatosplenomegaly. Exam is limited by habitus. There is no evidence of any incisional hernias.  Groins: No lymphadenopathy.  Extremities: No edema.  Pelvic: External genitalia within normal limits. Vagina slightly atrophic. Vaginal cuff is visualized are no visible lesions. Bimanual examination reveals no masses or nodularity. Rectal confirms.  Assessment/Plan: 57 year old with a stage IB grade 1 endometrioid adenocarcinoma diagnosed in April 2010. She status post definitive surgery as well as vaginal cuff brachii therapy. She's had no evidence of recurrent disease. She will followup with Dr. Sondra Come in radiation oncology in 6 months. At that  time she'll get her 5 year anniversary can be released from our clinic. She will continue having her routine GYN care with Dr. Virgina Jock. She does all be happy to see her in the future should the need arise.  Sheila Petrak A., MD 08/02/2013, 1:44 PM

## 2013-08-02 NOTE — Patient Instructions (Signed)
Return to see Dr. Sondra Come in 6 months. That will complete your 5 year anniversary and you may then follow up with your primary gynecologist.

## 2013-12-27 ENCOUNTER — Other Ambulatory Visit: Payer: Self-pay | Admitting: Internal Medicine

## 2013-12-27 DIAGNOSIS — Z1231 Encounter for screening mammogram for malignant neoplasm of breast: Secondary | ICD-10-CM

## 2014-01-05 ENCOUNTER — Ambulatory Visit
Admission: RE | Admit: 2014-01-05 | Discharge: 2014-01-05 | Disposition: A | Discharge status: Home / Self Care | Payer: Medicare Other | Source: Ambulatory Visit | Attending: Internal Medicine | Admitting: Internal Medicine

## 2014-01-05 DIAGNOSIS — Z1231 Encounter for screening mammogram for malignant neoplasm of breast: Secondary | ICD-10-CM

## 2014-02-26 ENCOUNTER — Ambulatory Visit: Payer: Medicare Other | Admitting: Radiation Oncology

## 2014-03-01 ENCOUNTER — Ambulatory Visit
Admission: RE | Admit: 2014-03-01 | Discharge: 2014-03-01 | Disposition: A | Discharge status: Home / Self Care | Payer: Medicare Other | Source: Ambulatory Visit | Attending: Radiation Oncology | Admitting: Radiation Oncology

## 2014-03-01 ENCOUNTER — Encounter: Payer: Self-pay | Admitting: Radiation Oncology

## 2014-03-01 ENCOUNTER — Other Ambulatory Visit (HOSPITAL_COMMUNITY)
Admission: RE | Admit: 2014-03-01 | Discharge: 2014-03-01 | Disposition: A | Discharge status: Home / Self Care | Payer: Medicare Other | Source: Ambulatory Visit | Attending: Radiation Oncology | Admitting: Radiation Oncology

## 2014-03-01 VITALS — BP 151/81 | HR 103 | Temp 97.9°F | Ht 68.0 in | Wt 279.9 lb

## 2014-03-01 DIAGNOSIS — C541 Malignant neoplasm of endometrium: Secondary | ICD-10-CM

## 2014-03-01 DIAGNOSIS — Z124 Encounter for screening for malignant neoplasm of cervix: Secondary | ICD-10-CM | POA: Diagnosis present

## 2014-03-01 NOTE — Progress Notes (Signed)
Radiation Oncology         [phone] ________________________________  Name: Sheila Leach MRN: [id number]  Date: 03/01/2014  DOB: Sep 28, 1955  Follow-Up Visit Note  CC: Precious Reel, MD  Sherol Dade., MD  Diagnosis:   Stage IB endometrioid adenocarcinoma    Interval Since Last Radiation:  5  Years, the patient completed postoperative intracavitary brachytherapy treatments  Narrative:  The patient returns today for routine follow-up.  She continues to have chronic pain in her lower back and left leg area. She denies any pelvic pain or vaginal bleeding. She denies any hematuria or rectal bleeding.                              ALLERGIES:  is allergic to iodine.  Meds: Current Outpatient Prescriptions  Medication Sig Dispense Refill  . albuterol (PROVENTIL HFA;VENTOLIN HFA) 108 (90 BASE) MCG/ACT inhaler Inhale 2 puffs into the lungs every 6 (six) hours as needed for wheezing or shortness of breath.      Marland Kitchen alendronate (FOSAMAX) 70 MG tablet       . Ascorbic Acid (VITAMIN C) 1000 MG tablet Take 1,000 mg by mouth daily.      Marland Kitchen atorvastatin (LIPITOR) 20 MG tablet Take 20 mg by mouth daily.      . cetirizine (ZYRTEC) 10 MG tablet Take 10 mg by mouth daily.      . ergocalciferol (VITAMIN D2) 50000 UNITS capsule Take 50,000 Units by mouth once a week.      Marland Kitchen ibuprofen (ADVIL,MOTRIN) 200 MG tablet Take 200 mg by mouth every 6 (six) hours as needed.      Marland Kitchen levothyroxine (SYNTHROID, LEVOTHROID) 200 MCG tablet Take 200 mcg by mouth daily.      Marland Kitchen lisinopril (PRINIVIL,ZESTRIL) 40 MG tablet Take 40 mg by mouth daily.      . Multiple Vitamins-Minerals (MULTIVITAMIN WITH MINERALS) tablet Take 1 tablet by mouth daily.      . vitamin E 400 UNIT capsule Take 400 Units by mouth daily.      Marland Kitchen acetaminophen (TYLENOL) 325 MG tablet Take 650 mg by mouth every 6 (six) hours as needed.       No current facility-administered medications for this encounter.    Physical Findings: The patient is in no  acute distress. Patient is alert and oriented.  height is 5\' 8"  (1.727 m) and weight is 279 lb 14.4 oz (126.962 kg). Her oral temperature is 97.9 F (36.6 C). Her blood pressure is 151/81 and her pulse is 103. Marland Kitchen  No palpable supraclavicular or axillary adenopathy. The lungs are clear to auscultation. The heart has a regular rhythm and rate. The abdomen is soft and nontender with normal bowel sounds. No inguinal adenopathy is appreciated. On pelvic examination external genitalia are unremarkable. A speculum exam is performed. No mucosal lesions are noted.  a Pap smear was obtained of the proximal vagina. On bimanual and rectovaginal examination no pelvic masses are appreciated but exam is compromised in light of patient's body habitus.  Lab Findings: Lab Results  Component Value Date   WBC 13.8* 11/28/2008   HGB 13.0 11/28/2008   HCT 38.2 11/28/2008   MCV 89.3 11/28/2008   PLT 281 11/28/2008      Radiographic Findings: No results found.  Impression:  No evidence of recurrence on clinical exam, Pap smear pending  Plan:  The patient has completed 5 years of followup. I recommended she followup with  her primary care physician on a yearly basis for pelvic exam and Pap smear.  ____________________________________ Blair Promise, MD

## 2014-03-01 NOTE — Progress Notes (Signed)
Sheila Leach here for follow up after treatment for endometrial cancer.  She reports chronic pain at a 7/10 in her left leg and lower back.  She is taking 3-4 ibuprofen for the pain.  She denies pelvic cramping, vaginal/rectal bleeding, bladder issues and diarrhea.  She reports fatigue from the pain in her leg and back.

## 2014-03-02 LAB — CYTOLOGY - PAP

## 2014-03-14 ENCOUNTER — Telehealth: Payer: Self-pay | Admitting: Oncology

## 2014-03-14 NOTE — Telephone Encounter (Signed)
Called Sheila Leach to inform her of the good results from her pap smear per Dr. Sondra Come.  Sheila Leach verbalized understanding.

## 2014-06-23 ENCOUNTER — Encounter (HOSPITAL_COMMUNITY): Payer: Self-pay | Admitting: Emergency Medicine

## 2014-06-23 ENCOUNTER — Emergency Department (HOSPITAL_COMMUNITY)
Admission: EM | Admit: 2014-06-23 | Discharge: 2014-06-24 | Disposition: A | Discharge status: Home / Self Care | Payer: Medicare Other | Attending: Emergency Medicine | Admitting: Emergency Medicine

## 2014-06-23 DIAGNOSIS — E079 Disorder of thyroid, unspecified: Secondary | ICD-10-CM | POA: Insufficient documentation

## 2014-06-23 DIAGNOSIS — I10 Essential (primary) hypertension: Secondary | ICD-10-CM | POA: Diagnosis not present

## 2014-06-23 DIAGNOSIS — R05 Cough: Secondary | ICD-10-CM | POA: Diagnosis not present

## 2014-06-23 DIAGNOSIS — Z923 Personal history of irradiation: Secondary | ICD-10-CM | POA: Insufficient documentation

## 2014-06-23 DIAGNOSIS — R000 Tachycardia, unspecified: Secondary | ICD-10-CM | POA: Diagnosis not present

## 2014-06-23 DIAGNOSIS — M1388 Other specified arthritis, other site: Secondary | ICD-10-CM | POA: Diagnosis not present

## 2014-06-23 DIAGNOSIS — R0981 Nasal congestion: Secondary | ICD-10-CM | POA: Insufficient documentation

## 2014-06-23 DIAGNOSIS — J45909 Unspecified asthma, uncomplicated: Secondary | ICD-10-CM | POA: Insufficient documentation

## 2014-06-23 DIAGNOSIS — Z859 Personal history of malignant neoplasm, unspecified: Secondary | ICD-10-CM | POA: Diagnosis not present

## 2014-06-23 DIAGNOSIS — Z8542 Personal history of malignant neoplasm of other parts of uterus: Secondary | ICD-10-CM | POA: Diagnosis not present

## 2014-06-23 DIAGNOSIS — E785 Hyperlipidemia, unspecified: Secondary | ICD-10-CM | POA: Diagnosis not present

## 2014-06-23 DIAGNOSIS — N390 Urinary tract infection, site not specified: Secondary | ICD-10-CM | POA: Diagnosis not present

## 2014-06-23 DIAGNOSIS — Z86010 Personal history of colonic polyps: Secondary | ICD-10-CM | POA: Diagnosis not present

## 2014-06-23 DIAGNOSIS — Z79899 Other long term (current) drug therapy: Secondary | ICD-10-CM | POA: Insufficient documentation

## 2014-06-23 DIAGNOSIS — Z87828 Personal history of other (healed) physical injury and trauma: Secondary | ICD-10-CM | POA: Insufficient documentation

## 2014-06-23 DIAGNOSIS — R112 Nausea with vomiting, unspecified: Secondary | ICD-10-CM | POA: Diagnosis present

## 2014-06-23 MED ORDER — SODIUM CHLORIDE 0.9 % IV BOLUS (SEPSIS)
500.0000 mL | Freq: Once | INTRAVENOUS | Status: AC
  Administered 2014-06-24: 500 mL via INTRAVENOUS

## 2014-06-24 ENCOUNTER — Emergency Department (HOSPITAL_COMMUNITY): Payer: Medicare Other

## 2014-06-24 LAB — CBC WITH DIFFERENTIAL/PLATELET
BASOS PCT: 0 % (ref 0–1)
Basophils Absolute: 0 10*3/uL (ref 0.0–0.1)
Eosinophils Absolute: 0 10*3/uL (ref 0.0–0.7)
Eosinophils Relative: 0 % (ref 0–5)
HEMATOCRIT: 40.9 % (ref 36.0–46.0)
HEMOGLOBIN: 13.3 g/dL (ref 12.0–15.0)
LYMPHS ABS: 1.1 10*3/uL (ref 0.7–4.0)
LYMPHS PCT: 6 % — AB (ref 12–46)
MCH: 28.7 pg (ref 26.0–34.0)
MCHC: 32.5 g/dL (ref 30.0–36.0)
MCV: 88.1 fL (ref 78.0–100.0)
MONO ABS: 1.3 10*3/uL — AB (ref 0.1–1.0)
MONOS PCT: 7 % (ref 3–12)
NEUTROS ABS: 15.8 10*3/uL — AB (ref 1.7–7.7)
Neutrophils Relative %: 87 % — ABNORMAL HIGH (ref 43–77)
Platelets: 207 10*3/uL (ref 150–400)
RBC: 4.64 MIL/uL (ref 3.87–5.11)
RDW: 14 % (ref 11.5–15.5)
WBC: 18.2 10*3/uL — ABNORMAL HIGH (ref 4.0–10.5)

## 2014-06-24 LAB — URINALYSIS, ROUTINE W REFLEX MICROSCOPIC
Bilirubin Urine: NEGATIVE
GLUCOSE, UA: NEGATIVE mg/dL
Ketones, ur: NEGATIVE mg/dL
Nitrite: NEGATIVE
Protein, ur: >300 mg/dL — AB
SPECIFIC GRAVITY, URINE: 1.02 (ref 1.005–1.030)
Urobilinogen, UA: 0.2 mg/dL (ref 0.0–1.0)
pH: 6 (ref 5.0–8.0)

## 2014-06-24 LAB — URINE MICROSCOPIC-ADD ON

## 2014-06-24 LAB — COMPREHENSIVE METABOLIC PANEL
ALBUMIN: 3.5 g/dL (ref 3.5–5.2)
ALK PHOS: 180 U/L — AB (ref 39–117)
ALT: 74 U/L — ABNORMAL HIGH (ref 0–35)
ANION GAP: 17 — AB (ref 5–15)
AST: 53 U/L — ABNORMAL HIGH (ref 0–37)
BUN: 27 mg/dL — AB (ref 6–23)
CO2: 20 mEq/L (ref 19–32)
Calcium: 11.4 mg/dL — ABNORMAL HIGH (ref 8.4–10.5)
Chloride: 100 mEq/L (ref 96–112)
Creatinine, Ser: 1.44 mg/dL — ABNORMAL HIGH (ref 0.50–1.10)
GFR calc Af Amer: 45 mL/min — ABNORMAL LOW (ref >90)
GFR calc non Af Amer: 39 mL/min — ABNORMAL LOW (ref >90)
Glucose, Bld: 177 mg/dL — ABNORMAL HIGH (ref 70–99)
POTASSIUM: 4.4 meq/L (ref 3.7–5.3)
SODIUM: 137 meq/L (ref 137–147)
TOTAL PROTEIN: 7.9 g/dL (ref 6.0–8.3)
Total Bilirubin: 0.8 mg/dL (ref 0.3–1.2)

## 2014-06-24 LAB — LACTIC ACID, PLASMA: LACTIC ACID, VENOUS: 2.1 mmol/L (ref 0.5–2.2)

## 2014-06-24 MED ORDER — ACETAMINOPHEN 325 MG PO TABS
650.0000 mg | ORAL_TABLET | Freq: Once | ORAL | Status: AC
  Administered 2014-06-24: 650 mg via ORAL
  Filled 2014-06-24: qty 2

## 2014-06-24 MED ORDER — CEPHALEXIN 500 MG PO CAPS: 500.0000 mg | ORAL_CAPSULE | Freq: Four times a day (QID) | ORAL | Status: DC

## 2014-06-24 MED ORDER — SODIUM CHLORIDE 0.9 % IV BOLUS (SEPSIS)
1000.0000 mL | Freq: Once | INTRAVENOUS | Status: AC
  Administered 2014-06-24: 1000 mL via INTRAVENOUS

## 2014-06-24 MED ORDER — SODIUM CHLORIDE 0.9 % IV BOLUS (SEPSIS)
500.0000 mL | Freq: Once | INTRAVENOUS | Status: AC
  Administered 2014-06-24: 500 mL via INTRAVENOUS

## 2014-06-24 MED ORDER — CEFTRIAXONE SODIUM 1 G IJ SOLR
1.0000 g | Freq: Once | INTRAMUSCULAR | Status: AC
  Administered 2014-06-24: 1 g via INTRAVENOUS
  Filled 2014-06-24: qty 10

## 2014-06-24 MED ORDER — IBUPROFEN 200 MG PO TABS
600.0000 mg | ORAL_TABLET | Freq: Once | ORAL | Status: AC
  Administered 2014-06-24: 600 mg via ORAL
  Filled 2014-06-24: qty 3

## 2014-06-24 NOTE — ED Provider Notes (Signed)
7:11 AM Pt signed out to me at shift change. Pt with cough, congestion, fever for 3 days. Pt came in with tachycardia, Fever of 104.5 rectally. Labs showed elevated WBC, elevated creatinine/bun. UA infected. CXR negative. Pt maining normal BP. Was given rocephin for infection in ED. Pt signed out pending 3L NS bolus, monitor VS.    Pt received 3L of NS. She continues to be tachycardic with HR in 120s. She is adamant about going home. She states she is feeling well. PA [facility] spoke with pt's PCP, she does have a follow up apt tomorrow for recheck. We discussed return precautions. Pt is comfortable going home, will be d/c with keflex for uti. I do think pt may have a viral URI as well, possibly influenza. Out of the window for tamiflu. Encouraged hydration.   Filed Vitals:   06/24/14 0530 06/24/14 0600 06/24/14 0630 06/24/14 0705  BP: 134/68 129/91 115/101 106/90  Pulse:    120  Temp:    99.1 F (37.3 C)  TempSrc:    Oral  Resp:    18  Height:      Weight:      SpO2:    94%     Renold Genta, PA-C 06/24/14 0717  Julianne Rice, MD 06/25/14 [phone]

## 2014-06-24 NOTE — ED Provider Notes (Signed)
CSN: [id number]     Arrival date & time 06/23/14  2304 History   First MD Initiated Contact with Patient 06/23/14 2335     Chief Complaint  Patient presents with  . Cough  . Nausea  . Nasal Congestion     (Consider location/radiation/quality/duration/timing/severity/associated sxs/prior Treatment) Patient is a 58 y.o. female presenting with cough. The history is provided by the patient and a relative. No language interpreter was used.  Cough Cough characteristics:  Productive Severity:  Moderate Onset quality:  Gradual Duration:  3 days Timing:  Constant Progression:  Worsening Chronicity:  New Associated symptoms: myalgias   Associated symptoms: no chills, no fever, no headaches, no rash, no shortness of breath and no sore throat   Associated symptoms comment:  She complains of generalized body aches, cough, congestion without sore throat. She has had "dry heaves" since early this morning. No diarrhea. She denies sick contacts.    Past Medical History  Diagnosis Date  . Thyroid disease   . Asthma   . Hypertension   . Cancer   . Endometrial cancer   . Back injury   . Seasonal allergies   . Arthritis     back  . Hyperlipidemia   . Personal history of colonic adenomas 01/24/2013  . Hx of radiation therapy 6/6/, 6/21, 6/24, 02/07/2009    brachytherapy   Past Surgical History  Procedure Laterality Date  . Abdominal hysterectomy    . Dilation and curettage of uterus      fibroids  . Thyroidectomy      childhood   Family History  Problem Relation Age of Onset  . Colon cancer Neg Hx   . Rectal cancer Neg Hx   . Stomach cancer Neg Hx   . Heart disease Mother   . Diabetes Mother   . Kidney disease Mother   . Heart disease Father   . Thyroid cancer Sister   . Esophageal cancer Brother   . Diabetes Brother   . Liver disease Brother    History  Substance Use Topics  . Smoking status: Never Smoker   . Smokeless tobacco: Never Used  . Alcohol Use: No   OB History     No data available     Review of Systems  Constitutional: Negative for fever and chills.  HENT: Positive for congestion. Negative for sore throat and trouble swallowing.   Respiratory: Positive for cough. Negative for shortness of breath.   Cardiovascular: Negative.   Gastrointestinal: Positive for nausea and vomiting. Negative for abdominal pain and diarrhea.  Genitourinary: Negative.  Negative for dysuria.  Musculoskeletal: Positive for myalgias.  Skin: Negative.  Negative for rash.  Neurological: Negative.  Negative for light-headedness and headaches.      Allergies  Iodine  Home Medications   Prior to Admission medications   Medication Sig Start Date End Date Taking? Authorizing Provider  acetaminophen (TYLENOL) 325 MG tablet Take 650 mg by mouth every 6 (six) hours as needed for mild pain.    Yes Historical Provider, MD  albuterol (PROVENTIL HFA;VENTOLIN HFA) 108 (90 BASE) MCG/ACT inhaler Inhale 2 puffs into the lungs every 6 (six) hours as needed for wheezing or shortness of breath.   Yes Historical Provider, MD  alendronate (FOSAMAX) 70 MG tablet Take 70 mg by mouth once a week.  02/22/13  Yes Historical Provider, MD  Ascorbic Acid (VITAMIN C) 1000 MG tablet Take 1,000 mg by mouth daily.   Yes Historical Provider, MD  atorvastatin (LIPITOR) 20 MG  tablet Take 20 mg by mouth daily.   Yes Historical Provider, MD  cetirizine (ZYRTEC) 10 MG tablet Take 10 mg by mouth daily.   Yes Historical Provider, MD  ergocalciferol (VITAMIN D2) 50000 UNITS capsule Take 50,000 Units by mouth once a week.   Yes Historical Provider, MD  ibuprofen (ADVIL,MOTRIN) 200 MG tablet Take 200 mg by mouth every 6 (six) hours as needed.   Yes Historical Provider, MD  levothyroxine (LEVOTHROID) 150 MCG tablet Take 300 mcg by mouth daily before breakfast.   Yes Historical Provider, MD  lisinopril (PRINIVIL,ZESTRIL) 20 MG tablet Take 20 mg by mouth daily.   Yes Historical Provider, MD  Multiple  Vitamins-Minerals (MULTIVITAMIN WITH MINERALS) tablet Take 1 tablet by mouth daily.   Yes Historical Provider, MD  vitamin E 400 UNIT capsule Take 400 Units by mouth daily.   Yes Historical Provider, MD  levothyroxine (SYNTHROID, LEVOTHROID) 200 MCG tablet Take 200 mcg by mouth daily.    Historical Provider, MD  lisinopril (PRINIVIL,ZESTRIL) 40 MG tablet Take 40 mg by mouth daily.    Historical Provider, MD   BP 124/74 mmHg  Pulse 144  Temp(Src) 104.5 F (40.3 C) (Rectal)  Resp 18  Ht 5\' 8"  (1.727 m)  Wt 266 lb (120.657 kg)  BMI 40.45 kg/m2  SpO2 94% Physical Exam  Constitutional: She is oriented to person, place, and time. She appears well-developed and well-nourished.  HENT:  Head: Normocephalic.  Right Ear: Tympanic membrane normal.  Left Ear: Tympanic membrane normal.  Mouth/Throat: Mucous membranes are dry.  Neck: Normal range of motion. Neck supple.  Cardiovascular: Normal rate and regular rhythm.   No murmur heard. Pulmonary/Chest: Effort normal and breath sounds normal. She has no wheezes. She has no rales.  Abdominal: Soft. Bowel sounds are normal. There is no tenderness. There is no rebound and no guarding.  Musculoskeletal: Normal range of motion. She exhibits no edema.  Neurological: She is alert and oriented to person, place, and time.  Skin: Skin is warm and dry. No rash noted.  Psychiatric: She has a normal mood and affect.    ED Course  Procedures (including critical care time) Labs Review Labs Reviewed  CBC WITH DIFFERENTIAL - Abnormal; Notable for the following:    WBC 18.2 (*)    Neutrophils Relative % 87 (*)    Neutro Abs 15.8 (*)    Lymphocytes Relative 6 (*)    Monocytes Absolute 1.3 (*)    All other components within normal limits  URINALYSIS, ROUTINE W REFLEX MICROSCOPIC - Abnormal; Notable for the following:    APPearance CLOUDY (*)    Hgb urine dipstick LARGE (*)    Protein, ur >300 (*)    Leukocytes, UA MODERATE (*)    All other components  within normal limits  URINE MICROSCOPIC-ADD ON - Abnormal; Notable for the following:    Bacteria, UA FEW (*)    All other components within normal limits  URINE CULTURE  CULTURE, BLOOD (ROUTINE X 2)  CULTURE, BLOOD (ROUTINE X 2)  COMPREHENSIVE METABOLIC PANEL    Imaging Review No results found.   EKG Interpretation None      MDM   Final diagnoses:  None    1. UTI 2. Tachycardia  Tachycardia responds to fluid bolus. Rocephin given for UTI. Patient is drinking and appears comfortable. She reports feeling improved. Is interested in going home vs staying in the hospital. Family remains at bedside.   Multiple re-evaluations: she remains comfortable. Updated on plans as  they progress. All questions of patient and family answered.   Discussed patient's presentation and current condition with Dr. Virgina Jock, along with her desire to go home. BP stable, fever down. She could arguably be in early sepsis (leukocytosis, febrile Tmax 104.5 in ED, tachycardic) however, she appears well clinically, has a normal lactic acid and desires to be discharged home. Dr. Virgina Jock is in agreement and will provide close follow up in the office for recheck.   Patient currently receiving IV fluids. Signed out to [employer], PA-C, for disposition after reassessment.     Dewaine Oats, PA-C 06/24/14 2224  Julianne Rice, MD 06/25/14 [phone]

## 2014-06-24 NOTE — ED Notes (Signed)
MD at bedside. 

## 2014-06-24 NOTE — ED Notes (Signed)
Per PA, pt was offered another liter of NS prior to d/c. Pt refused. Pt verbalized understanding that her HR was high and that she needed fluids. Pt sts that she wants to go home and she will see her PCP in the am. PA aware and sts to d/c pt home. Pt A&O and in NAD

## 2014-06-24 NOTE — Discharge Instructions (Signed)
FOLLOW UP WITH YOUR DOCTOR TOMORROW AS DISCUSSED FOR RECHECK. RETURN TO THE EMERGENCY ROOM WITH ANY PERSISTENT HIGH FEVER, UNCONTROLLED VOMITING, PAIN OR ANY OTHER NEW SYMPTOMS OF CONCERN  Urinary Tract Infection Urinary tract infections (UTIs) can develop anywhere along your urinary tract. Your urinary tract is your body's drainage system for removing wastes and extra water. Your urinary tract includes two kidneys, two ureters, a bladder, and a urethra. Your kidneys are a pair of bean-shaped organs. Each kidney is about the size of your fist. They are located below your ribs, one on each side of your spine. CAUSES Infections are caused by microbes, which are microscopic organisms, including fungi, viruses, and bacteria. These organisms are so small that they can only be seen through a microscope. Bacteria are the microbes that most commonly cause UTIs. SYMPTOMS  Symptoms of UTIs may vary by age and gender of the patient and by the location of the infection. Symptoms in young women typically include a frequent and intense urge to urinate and a painful, burning feeling in the bladder or urethra during urination. Older women and men are more likely to be tired, shaky, and weak and have muscle aches and abdominal pain. A fever may mean the infection is in your kidneys. Other symptoms of a kidney infection include pain in your back or sides below the ribs, nausea, and vomiting. DIAGNOSIS To diagnose a UTI, your caregiver will ask you about your symptoms. Your caregiver also will ask to provide a urine sample. The urine sample will be tested for bacteria and white blood cells. White blood cells are made by your body to help fight infection. TREATMENT  Typically, UTIs can be treated with medication. Because most UTIs are caused by a bacterial infection, they usually can be treated with the use of antibiotics. The choice of antibiotic and length of treatment depend on your symptoms and the type of bacteria  causing your infection. HOME CARE INSTRUCTIONS  If you were prescribed antibiotics, take them exactly as your caregiver instructs you. Finish the medication even if you feel better after you have only taken some of the medication.  Drink enough water and fluids to keep your urine clear or pale yellow.  Avoid caffeine, tea, and carbonated beverages. They tend to irritate your bladder.  Empty your bladder often. Avoid holding urine for long periods of time.  Empty your bladder before and after sexual intercourse.  After a bowel movement, women should cleanse from front to back. Use each tissue only once. SEEK MEDICAL CARE IF:   You have back pain.  You develop a fever.  Your symptoms do not begin to resolve within 3 days. SEEK IMMEDIATE MEDICAL CARE IF:   You have severe back pain or lower abdominal pain.  You develop chills.  You have nausea or vomiting.  You have continued burning or discomfort with urination. MAKE SURE YOU:   Understand these instructions.  Will watch your condition.  Will get help right away if you are not doing well or get worse. Document Released: 04/29/2005 Document Revised: 01/19/2012 Document Reviewed: 08/28/2011 [facility] Patient Information 2015 [place], [place]. This information is not intended to replace advice given to you by your health care provider. Make sure you discuss any questions you have with your health care provider.

## 2014-06-24 NOTE — ED Notes (Signed)
Patient transported to X-ray 

## 2014-06-25 ENCOUNTER — Telehealth (HOSPITAL_COMMUNITY): Payer: Self-pay

## 2014-06-25 NOTE — Telephone Encounter (Signed)
Call from [facility] (+) 1 anaerobic bottle Gram (-) rods.  Chart to MD for review.

## 2014-06-25 NOTE — ED Provider Notes (Signed)
Blood culture today showed gram-negative rods.  I discussed the case with her PCP, Dr. Virgina Jock.  He saw the patient this morning and she was nontoxic and taking her medications as directed.  He will continue to follow-up and treat the patient, as planned, and as needed.  Richarda Blade, MD 06/25/14 [phone]

## 2014-06-26 ENCOUNTER — Telehealth: Payer: Self-pay | Admitting: *Deleted

## 2014-06-26 LAB — URINE CULTURE

## 2014-06-27 LAB — CULTURE, BLOOD (ROUTINE X 2)

## 2014-06-28 ENCOUNTER — Telehealth (HOSPITAL_BASED_OUTPATIENT_CLINIC_OR_DEPARTMENT_OTHER): Payer: Self-pay | Admitting: *Deleted

## 2014-06-28 NOTE — Telephone Encounter (Signed)
(+)  urine culture treated with Cephalexin, OK per Jerene Bears, Pharm

## 2014-06-30 LAB — CULTURE, BLOOD (ROUTINE X 2): CULTURE: NO GROWTH

## 2014-10-03 ENCOUNTER — Other Ambulatory Visit (HOSPITAL_COMMUNITY): Payer: Self-pay | Admitting: Urology

## 2014-10-03 DIAGNOSIS — N1330 Unspecified hydronephrosis: Secondary | ICD-10-CM

## 2014-10-11 ENCOUNTER — Encounter (HOSPITAL_COMMUNITY): Payer: Self-pay

## 2014-10-11 ENCOUNTER — Ambulatory Visit (HOSPITAL_COMMUNITY)
Admission: RE | Admit: 2014-10-11 | Discharge: 2014-10-11 | Disposition: A | Discharge status: Home / Self Care | Payer: Medicare Other | Source: Ambulatory Visit | Attending: Urology | Admitting: Urology

## 2014-10-11 DIAGNOSIS — N1330 Unspecified hydronephrosis: Secondary | ICD-10-CM | POA: Insufficient documentation

## 2014-10-11 DIAGNOSIS — N200 Calculus of kidney: Secondary | ICD-10-CM | POA: Insufficient documentation

## 2014-10-11 MED ORDER — TECHNETIUM TC 99M MERTIATIDE
15.2000 | Freq: Once | INTRAVENOUS | Status: AC | PRN
  Administered 2014-10-11: 15.2 via INTRAVENOUS

## 2015-01-11 ENCOUNTER — Other Ambulatory Visit: Payer: Self-pay

## 2015-01-11 DIAGNOSIS — Z1231 Encounter for screening mammogram for malignant neoplasm of breast: Secondary | ICD-10-CM

## 2015-01-18 ENCOUNTER — Ambulatory Visit
Admission: RE | Admit: 2015-01-18 | Discharge: 2015-01-18 | Disposition: A | Discharge status: Home / Self Care | Payer: Medicare Other | Source: Ambulatory Visit

## 2015-01-18 DIAGNOSIS — Z1231 Encounter for screening mammogram for malignant neoplasm of breast: Secondary | ICD-10-CM

## 2015-06-06 ENCOUNTER — Other Ambulatory Visit (HOSPITAL_COMMUNITY): Payer: Self-pay | Admitting: Internal Medicine

## 2015-06-06 DIAGNOSIS — E213 Hyperparathyroidism, unspecified: Secondary | ICD-10-CM

## 2015-06-20 ENCOUNTER — Encounter (HOSPITAL_COMMUNITY)
Admission: RE | Admit: 2015-06-20 | Discharge: 2015-06-20 | Disposition: A | Discharge status: Home / Self Care | Payer: Medicare Other | Source: Ambulatory Visit | Attending: Internal Medicine | Admitting: Internal Medicine

## 2015-06-20 DIAGNOSIS — E213 Hyperparathyroidism, unspecified: Secondary | ICD-10-CM | POA: Diagnosis present

## 2015-06-20 MED ORDER — TECHNETIUM TC 99M SESTAMIBI - CARDIOLITE
24.2000 | Freq: Once | INTRAVENOUS | Status: AC | PRN
Start: 2015-06-20 — End: 2015-06-20
  Administered 2015-06-20: 24.2 via INTRAVENOUS

## 2016-01-20 ENCOUNTER — Other Ambulatory Visit: Payer: Self-pay | Admitting: Internal Medicine

## 2016-01-20 DIAGNOSIS — Z1231 Encounter for screening mammogram for malignant neoplasm of breast: Secondary | ICD-10-CM

## 2016-01-30 ENCOUNTER — Ambulatory Visit
Admission: RE | Admit: 2016-01-30 | Discharge: 2016-01-30 | Disposition: A | Discharge status: Home / Self Care | Payer: Medicare Other | Source: Ambulatory Visit | Attending: Internal Medicine | Admitting: Internal Medicine

## 2016-01-30 DIAGNOSIS — Z1231 Encounter for screening mammogram for malignant neoplasm of breast: Secondary | ICD-10-CM

## 2016-02-05 ENCOUNTER — Encounter: Payer: Self-pay | Admitting: Internal Medicine

## 2016-02-25 ENCOUNTER — Encounter: Payer: Self-pay | Admitting: Internal Medicine

## 2016-03-10 ENCOUNTER — Encounter: Payer: Self-pay | Admitting: Internal Medicine

## 2016-03-10 ENCOUNTER — Ambulatory Visit (AMBULATORY_SURGERY_CENTER): Payer: Self-pay

## 2016-03-10 VITALS — Ht 68.0 in | Wt 282.4 lb

## 2016-03-10 DIAGNOSIS — Z86010 Personal history of colonic polyps: Secondary | ICD-10-CM

## 2016-03-10 NOTE — Progress Notes (Signed)
Per pt, no allergies to soy or egg products.Pt not taking any weight loss meds or using  O2 at home. 

## 2016-03-24 ENCOUNTER — Encounter: Payer: Self-pay | Admitting: Internal Medicine

## 2016-03-24 ENCOUNTER — Ambulatory Visit (AMBULATORY_SURGERY_CENTER): Payer: Medicare Other | Admitting: Internal Medicine

## 2016-03-24 VITALS — BP 118/74 | HR 90 | Temp 99.3°F | Resp 25 | Ht 68.0 in | Wt 282.0 lb

## 2016-03-24 DIAGNOSIS — Z86010 Personal history of colonic polyps: Secondary | ICD-10-CM

## 2016-03-24 DIAGNOSIS — D122 Benign neoplasm of ascending colon: Secondary | ICD-10-CM

## 2016-03-24 MED ORDER — SODIUM CHLORIDE 0.9 % IV SOLN: 500.0000 mL | INTRAVENOUS | Status: DC

## 2016-03-24 NOTE — Progress Notes (Signed)
Called to room to assist during endoscopic procedure.  Patient ID and intended procedure confirmed with present staff. Received instructions for my participation in the procedure from the performing physician.  

## 2016-03-24 NOTE — Patient Instructions (Addendum)
   I found and removed one tiny polyp. I will let you know pathology results and when to have another routine colonoscopy by mail.  I appreciate the opportunity to care for you. Gatha Mayer, MD, [facility]   Information on polyps ,diverticulosis ,and hemorrhoids given to you today  YOU HAD AN ENDOSCOPIC PROCEDURE TODAY AT [place]:   Refer to the procedure report that was given to you for any specific questions about what was found during the examination.  If the procedure report does not answer your questions, please call your gastroenterologist to clarify.  If you requested that your care partner not be given the details of your procedure findings, then the procedure report has been included in a sealed envelope for you to review at your convenience later.  YOU SHOULD EXPECT: Some feelings of bloating in the abdomen. Passage of more gas than usual.  Walking can help get rid of the air that was put into your GI tract during the procedure and reduce the bloating. If you had a lower endoscopy (such as a colonoscopy or flexible sigmoidoscopy) you may notice spotting of blood in your stool or on the toilet paper. If you underwent a bowel prep for your procedure, you may not have a normal bowel movement for a few days.  Please Note:  You might notice some irritation and congestion in your nose or some drainage.  This is from the oxygen used during your procedure.  There is no need for concern and it should clear up in a day or so.  SYMPTOMS TO REPORT IMMEDIATELY:   Following lower endoscopy (colonoscopy or flexible sigmoidoscopy):  Excessive amounts of blood in the stool  Significant tenderness or worsening of abdominal pains  Swelling of the abdomen that is new, acute  Fever of 100F or higher    For urgent or emergent issues, a gastroenterologist can be reached at any hour by calling [phone].   DIET:  We do recommend a small meal at first, but then you may  proceed to your regular diet.  Drink plenty of fluids but you should avoid alcoholic beverages for 24 hours.  ACTIVITY:  You should plan to take it easy for the rest of today and you should NOT DRIVE or use heavy machinery until tomorrow (because of the sedation medicines used during the test).    FOLLOW UP: Our staff will call the number listed on your records the next business day following your procedure to check on you and address any questions or concerns that you may have regarding the information given to you following your procedure. If we do not reach you, we will leave a message.  However, if you are feeling well and you are not experiencing any problems, there is no need to return our call.  We will assume that you have returned to your regular daily activities without incident.  If any biopsies were taken you will be contacted by phone or by letter within the next 1-3 weeks.  Please call us at [phone] if you have not heard about the biopsies in 3 weeks.    SIGNATURES/CONFIDENTIALITY: You and/or your care partner have signed paperwork which will be entered into your electronic medical record.  These signatures attest to the fact that that the information above on your After Visit Summary has been reviewed and is understood.  Full responsibility of the confidentiality of this discharge information lies with you and/or your care-partner.

## 2016-03-24 NOTE — Progress Notes (Signed)
Patient awakening,vss,report to rn 

## 2016-03-24 NOTE — Op Note (Signed)
[name] Procedure Date: 03/24/2016 1:59 PM MRN: [id number] Endoscopist: Gatha Mayer , MD Age: 60 Referring MD:  Date of Birth: [birth year] Gender: Female Account #: [id number] Procedure:                Colonoscopy Indications:              Surveillance: Personal history of adenomatous                            polyps on last colonoscopy 3 years ago Medicines:                Propofol per Anesthesia, Monitored Anesthesia Care Procedure:                Pre-Anesthesia Assessment:                           - Prior to the procedure, a History and Physical                            was performed, and patient medications and                            allergies were reviewed. The patient's tolerance of                            previous anesthesia was also reviewed. The risks                            and benefits of the procedure and the sedation                            options and risks were discussed with the patient.                            All questions were answered, and informed consent                            was obtained. Prior Anticoagulants: The patient has                            taken no previous anticoagulant or antiplatelet                            agents. ASA Grade Assessment: III - A patient with                            severe systemic disease. After reviewing the risks                            and benefits, the patient was deemed in                            satisfactory condition to undergo the procedure.  After obtaining informed consent, the colonoscope                            was passed under direct vision. Throughout the                            procedure, the patient's blood pressure, pulse, and                            oxygen saturations were monitored continuously. The                            Model CF-HQ190L [id number]) scope was introduced   through the anus and advanced to the the cecum,                            identified by appendiceal orifice and ileocecal                            valve. The ileocecal valve, appendiceal orifice,                            and rectum were photographed. The patient tolerated                            the procedure well. The quality of the bowel                            preparation was good. The bowel preparation used                            was Miralax. Scope In: 2:11:30 PM Scope Out: 2:25:39 PM Scope Withdrawal Time: 0 hours 10 minutes 53 seconds  Total Procedure Duration: 0 hours 14 minutes 9 seconds  Findings:                 The perianal and digital rectal examinations were                            normal.                           A 2 mm polyp was found in the ascending colon. The                            polyp was sessile. The polyp was removed with a                            cold biopsy forceps. Resection and retrieval were                            complete. Verification of patient identification                            for the specimen was done.  Estimated blood loss was                            minimal.                           Diverticula were found in the sigmoid colon.                           Internal hemorrhoids were found during retroflexion.                           The exam was otherwise without abnormality on                            direct and retroflexion views. Complications:            No immediate complications. Estimated Blood Loss:     Estimated blood loss was minimal. Impression:               - One 2 mm polyp in the ascending colon, removed                            with a cold biopsy forceps. Resected and retrieved.                           - Mild diverticulosis in the sigmoid colon.                           - Internal hemorrhoids.                           - The examination was otherwise normal on direct                             and retroflexion views.                           - Personal history of colonic polyps. Recommendation:           - Patient has a contact number available for                            emergencies. The signs and symptoms of potential                            delayed complications were discussed with the                            patient. Return to normal activities tomorrow.                            Written discharge instructions were provided to the                            patient.                           -  Resume previous diet.                           - Continue present medications.                           - Repeat colonoscopy is recommended for                            surveillance. The colonoscopy date will be                            determined after pathology results from today's                            exam become available for review. Gatha Mayer, MD 03/24/2016 2:30:43 PM This report has been signed electronically.

## 2016-03-24 NOTE — Progress Notes (Signed)
Patient ID: Sheila Leach, female   DOB: July 07, 1956, 60 y.o.   MRN: [id number] BP 198/100 anesthesia notified.

## 2016-03-25 ENCOUNTER — Telehealth: Payer: Self-pay | Admitting: *Deleted

## 2016-03-25 NOTE — Telephone Encounter (Signed)
No answer. Name identifier. Message left to call if questions or concerns. 

## 2016-03-30 ENCOUNTER — Encounter: Payer: Self-pay | Admitting: Internal Medicine

## 2016-03-30 NOTE — Progress Notes (Signed)
2 mm adenoma w/ hx adenomas 2014 Colon recall 2022

## 2017-01-04 ENCOUNTER — Other Ambulatory Visit: Payer: Self-pay | Admitting: Internal Medicine

## 2017-01-04 DIAGNOSIS — Z1231 Encounter for screening mammogram for malignant neoplasm of breast: Secondary | ICD-10-CM

## 2017-02-01 ENCOUNTER — Ambulatory Visit
Admission: RE | Admit: 2017-02-01 | Discharge: 2017-02-01 | Disposition: A | Discharge status: Home / Self Care | Payer: Medicare Other | Source: Ambulatory Visit | Attending: Internal Medicine | Admitting: Internal Medicine

## 2017-02-01 DIAGNOSIS — Z1231 Encounter for screening mammogram for malignant neoplasm of breast: Secondary | ICD-10-CM

## 2017-02-10 ENCOUNTER — Other Ambulatory Visit (HOSPITAL_COMMUNITY): Payer: Self-pay | Admitting: *Deleted

## 2017-02-11 ENCOUNTER — Ambulatory Visit (HOSPITAL_COMMUNITY)
Admission: RE | Admit: 2017-02-11 | Discharge: 2017-02-11 | Disposition: A | Discharge status: Home / Self Care | Payer: Medicare Other | Source: Ambulatory Visit | Attending: Internal Medicine | Admitting: Internal Medicine

## 2017-02-11 DIAGNOSIS — M810 Age-related osteoporosis without current pathological fracture: Secondary | ICD-10-CM | POA: Insufficient documentation

## 2017-02-11 MED ORDER — ZOLEDRONIC ACID 5 MG/100ML IV SOLN
5.0000 mg | Freq: Once | INTRAVENOUS | Status: AC
  Administered 2017-02-11: 5 mg via INTRAVENOUS

## 2017-02-11 MED ORDER — ZOLEDRONIC ACID 5 MG/100ML IV SOLN
INTRAVENOUS | Status: AC
  Filled 2017-02-11: qty 100

## 2017-09-18 ENCOUNTER — Other Ambulatory Visit: Payer: Self-pay

## 2017-09-18 ENCOUNTER — Encounter (HOSPITAL_COMMUNITY): Payer: Self-pay | Admitting: Emergency Medicine

## 2017-09-18 DIAGNOSIS — M1990 Unspecified osteoarthritis, unspecified site: Secondary | ICD-10-CM | POA: Diagnosis present

## 2017-09-18 DIAGNOSIS — M549 Dorsalgia, unspecified: Secondary | ICD-10-CM | POA: Diagnosis present

## 2017-09-18 DIAGNOSIS — N179 Acute kidney failure, unspecified: Secondary | ICD-10-CM | POA: Diagnosis present

## 2017-09-18 DIAGNOSIS — N390 Urinary tract infection, site not specified: Secondary | ICD-10-CM | POA: Diagnosis not present

## 2017-09-18 DIAGNOSIS — B961 Klebsiella pneumoniae [K. pneumoniae] as the cause of diseases classified elsewhere: Secondary | ICD-10-CM | POA: Diagnosis present

## 2017-09-18 DIAGNOSIS — Z923 Personal history of irradiation: Secondary | ICD-10-CM

## 2017-09-18 DIAGNOSIS — R0600 Dyspnea, unspecified: Secondary | ICD-10-CM | POA: Diagnosis not present

## 2017-09-18 DIAGNOSIS — N182 Chronic kidney disease, stage 2 (mild): Secondary | ICD-10-CM | POA: Diagnosis present

## 2017-09-18 DIAGNOSIS — Z833 Family history of diabetes mellitus: Secondary | ICD-10-CM

## 2017-09-18 DIAGNOSIS — N136 Pyonephrosis: Secondary | ICD-10-CM | POA: Diagnosis present

## 2017-09-18 DIAGNOSIS — Z87442 Personal history of urinary calculi: Secondary | ICD-10-CM

## 2017-09-18 DIAGNOSIS — J45909 Unspecified asthma, uncomplicated: Secondary | ICD-10-CM | POA: Diagnosis present

## 2017-09-18 DIAGNOSIS — Z79890 Hormone replacement therapy (postmenopausal): Secondary | ICD-10-CM

## 2017-09-18 DIAGNOSIS — Z90710 Acquired absence of both cervix and uterus: Secondary | ICD-10-CM

## 2017-09-18 DIAGNOSIS — Z8249 Family history of ischemic heart disease and other diseases of the circulatory system: Secondary | ICD-10-CM

## 2017-09-18 DIAGNOSIS — E876 Hypokalemia: Secondary | ICD-10-CM | POA: Diagnosis present

## 2017-09-18 DIAGNOSIS — I129 Hypertensive chronic kidney disease with stage 1 through stage 4 chronic kidney disease, or unspecified chronic kidney disease: Secondary | ICD-10-CM | POA: Diagnosis present

## 2017-09-18 DIAGNOSIS — Z800 Family history of malignant neoplasm of digestive organs: Secondary | ICD-10-CM

## 2017-09-18 DIAGNOSIS — R040 Epistaxis: Secondary | ICD-10-CM | POA: Diagnosis not present

## 2017-09-18 DIAGNOSIS — Z8542 Personal history of malignant neoplasm of other parts of uterus: Secondary | ICD-10-CM

## 2017-09-18 DIAGNOSIS — E039 Hypothyroidism, unspecified: Secondary | ICD-10-CM | POA: Diagnosis present

## 2017-09-18 DIAGNOSIS — Z91048 Other nonmedicinal substance allergy status: Secondary | ICD-10-CM

## 2017-09-18 DIAGNOSIS — Z6841 Body Mass Index (BMI) 40.0 and over, adult: Secondary | ICD-10-CM

## 2017-09-18 DIAGNOSIS — E872 Acidosis: Secondary | ICD-10-CM | POA: Diagnosis present

## 2017-09-18 DIAGNOSIS — Z808 Family history of malignant neoplasm of other organs or systems: Secondary | ICD-10-CM

## 2017-09-18 DIAGNOSIS — Z841 Family history of disorders of kidney and ureter: Secondary | ICD-10-CM

## 2017-09-18 DIAGNOSIS — E875 Hyperkalemia: Secondary | ICD-10-CM | POA: Diagnosis present

## 2017-09-18 DIAGNOSIS — Z791 Long term (current) use of non-steroidal anti-inflammatories (NSAID): Secondary | ICD-10-CM

## 2017-09-18 DIAGNOSIS — E785 Hyperlipidemia, unspecified: Secondary | ICD-10-CM | POA: Diagnosis present

## 2017-09-18 DIAGNOSIS — E8770 Fluid overload, unspecified: Secondary | ICD-10-CM | POA: Diagnosis not present

## 2017-09-18 DIAGNOSIS — Z79899 Other long term (current) drug therapy: Secondary | ICD-10-CM

## 2017-09-18 DIAGNOSIS — N130 Hydronephrosis with ureteropelvic junction obstruction: Principal | ICD-10-CM | POA: Diagnosis present

## 2017-09-18 DIAGNOSIS — M810 Age-related osteoporosis without current pathological fracture: Secondary | ICD-10-CM | POA: Diagnosis present

## 2017-09-18 DIAGNOSIS — E669 Obesity, unspecified: Secondary | ICD-10-CM | POA: Diagnosis present

## 2017-09-18 DIAGNOSIS — Z86010 Personal history of colonic polyps: Secondary | ICD-10-CM

## 2017-09-18 DIAGNOSIS — G8929 Other chronic pain: Secondary | ICD-10-CM | POA: Diagnosis present

## 2017-09-18 MED ORDER — ONDANSETRON 4 MG PO TBDP
4.0000 mg | ORAL_TABLET | Freq: Once | ORAL | Status: AC | PRN
  Administered 2017-09-23: 4 mg via ORAL
  Filled 2017-09-18 (×2): qty 1

## 2017-09-18 NOTE — ED Notes (Addendum)
Pt reports finding dark red blood in her urine tonight at 20:45. Also, she reports having n/v/d and abdominal pain since Tuesday.

## 2017-09-18 NOTE — ED Triage Notes (Signed)
Pt reports having blood in urine that started today and has been having vomiting for the last several days. Pt reports urine is dark in color.

## 2017-09-19 ENCOUNTER — Inpatient Hospital Stay (HOSPITAL_COMMUNITY)
Admission: EM | Admit: 2017-09-19 | Discharge: 2017-09-26 | DRG: 660 | Disposition: A | Discharge status: Home / Self Care | Payer: Medicare Other | Attending: Family Medicine | Admitting: Family Medicine

## 2017-09-19 ENCOUNTER — Encounter (HOSPITAL_COMMUNITY): Payer: Self-pay | Admitting: Anesthesiology

## 2017-09-19 ENCOUNTER — Emergency Department (HOSPITAL_COMMUNITY): Payer: Medicare Other

## 2017-09-19 ENCOUNTER — Inpatient Hospital Stay (HOSPITAL_COMMUNITY): Payer: Medicare Other | Admitting: Anesthesiology

## 2017-09-19 ENCOUNTER — Inpatient Hospital Stay (HOSPITAL_COMMUNITY): Payer: Medicare Other

## 2017-09-19 ENCOUNTER — Encounter (HOSPITAL_COMMUNITY)
Admission: EM | Disposition: A | Discharge status: Home / Self Care | Payer: Self-pay | Source: Home / Self Care | Attending: Family Medicine

## 2017-09-19 DIAGNOSIS — I129 Hypertensive chronic kidney disease with stage 1 through stage 4 chronic kidney disease, or unspecified chronic kidney disease: Secondary | ICD-10-CM | POA: Diagnosis present

## 2017-09-19 DIAGNOSIS — Z8542 Personal history of malignant neoplasm of other parts of uterus: Secondary | ICD-10-CM | POA: Diagnosis not present

## 2017-09-19 DIAGNOSIS — N130 Hydronephrosis with ureteropelvic junction obstruction: Secondary | ICD-10-CM | POA: Diagnosis present

## 2017-09-19 DIAGNOSIS — N131 Hydronephrosis with ureteral stricture, not elsewhere classified: Secondary | ICD-10-CM | POA: Diagnosis not present

## 2017-09-19 DIAGNOSIS — M810 Age-related osteoporosis without current pathological fracture: Secondary | ICD-10-CM | POA: Diagnosis present

## 2017-09-19 DIAGNOSIS — Z8249 Family history of ischemic heart disease and other diseases of the circulatory system: Secondary | ICD-10-CM | POA: Diagnosis not present

## 2017-09-19 DIAGNOSIS — E039 Hypothyroidism, unspecified: Secondary | ICD-10-CM | POA: Diagnosis present

## 2017-09-19 DIAGNOSIS — N1330 Unspecified hydronephrosis: Secondary | ICD-10-CM

## 2017-09-19 DIAGNOSIS — I10 Essential (primary) hypertension: Secondary | ICD-10-CM | POA: Diagnosis present

## 2017-09-19 DIAGNOSIS — E872 Acidosis: Secondary | ICD-10-CM | POA: Diagnosis present

## 2017-09-19 DIAGNOSIS — N390 Urinary tract infection, site not specified: Secondary | ICD-10-CM | POA: Diagnosis present

## 2017-09-19 DIAGNOSIS — Z6841 Body Mass Index (BMI) 40.0 and over, adult: Secondary | ICD-10-CM | POA: Diagnosis not present

## 2017-09-19 DIAGNOSIS — Z923 Personal history of irradiation: Secondary | ICD-10-CM | POA: Diagnosis not present

## 2017-09-19 DIAGNOSIS — N132 Hydronephrosis with renal and ureteral calculous obstruction: Secondary | ICD-10-CM | POA: Diagnosis present

## 2017-09-19 DIAGNOSIS — N136 Pyonephrosis: Secondary | ICD-10-CM | POA: Diagnosis present

## 2017-09-19 DIAGNOSIS — E785 Hyperlipidemia, unspecified: Secondary | ICD-10-CM | POA: Diagnosis present

## 2017-09-19 DIAGNOSIS — R1111 Vomiting without nausea: Secondary | ICD-10-CM | POA: Diagnosis not present

## 2017-09-19 DIAGNOSIS — N179 Acute kidney failure, unspecified: Secondary | ICD-10-CM | POA: Diagnosis present

## 2017-09-19 DIAGNOSIS — E8770 Fluid overload, unspecified: Secondary | ICD-10-CM | POA: Diagnosis not present

## 2017-09-19 DIAGNOSIS — J45909 Unspecified asthma, uncomplicated: Secondary | ICD-10-CM | POA: Diagnosis present

## 2017-09-19 DIAGNOSIS — Z87442 Personal history of urinary calculi: Secondary | ICD-10-CM | POA: Diagnosis not present

## 2017-09-19 DIAGNOSIS — M1990 Unspecified osteoarthritis, unspecified site: Secondary | ICD-10-CM | POA: Diagnosis present

## 2017-09-19 DIAGNOSIS — R040 Epistaxis: Secondary | ICD-10-CM | POA: Diagnosis not present

## 2017-09-19 DIAGNOSIS — Z833 Family history of diabetes mellitus: Secondary | ICD-10-CM | POA: Diagnosis not present

## 2017-09-19 DIAGNOSIS — Z91048 Other nonmedicinal substance allergy status: Secondary | ICD-10-CM | POA: Diagnosis not present

## 2017-09-19 DIAGNOSIS — R0600 Dyspnea, unspecified: Secondary | ICD-10-CM | POA: Diagnosis not present

## 2017-09-19 DIAGNOSIS — R319 Hematuria, unspecified: Secondary | ICD-10-CM | POA: Diagnosis present

## 2017-09-19 DIAGNOSIS — N182 Chronic kidney disease, stage 2 (mild): Secondary | ICD-10-CM | POA: Diagnosis present

## 2017-09-19 DIAGNOSIS — Z86010 Personal history of colonic polyps: Secondary | ICD-10-CM | POA: Diagnosis not present

## 2017-09-19 DIAGNOSIS — Z90710 Acquired absence of both cervix and uterus: Secondary | ICD-10-CM | POA: Diagnosis not present

## 2017-09-19 DIAGNOSIS — Z87448 Personal history of other diseases of urinary system: Secondary | ICD-10-CM

## 2017-09-19 DIAGNOSIS — E876 Hypokalemia: Secondary | ICD-10-CM | POA: Diagnosis present

## 2017-09-19 DIAGNOSIS — N020 Recurrent and persistent hematuria with minor glomerular abnormality: Secondary | ICD-10-CM | POA: Diagnosis not present

## 2017-09-19 DIAGNOSIS — R0602 Shortness of breath: Secondary | ICD-10-CM

## 2017-09-19 HISTORY — DX: Personal history of other diseases of urinary system: Z87.448

## 2017-09-19 HISTORY — PX: CYSTOSCOPY W/ URETERAL STENT PLACEMENT: SHX1429

## 2017-09-19 LAB — BLOOD CULTURE ID PANEL (REFLEXED)
ACINETOBACTER BAUMANNII: NOT DETECTED
CANDIDA ALBICANS: NOT DETECTED
CANDIDA KRUSEI: NOT DETECTED
CANDIDA PARAPSILOSIS: NOT DETECTED
CANDIDA TROPICALIS: NOT DETECTED
Candida glabrata: NOT DETECTED
Carbapenem resistance: NOT DETECTED
ENTEROBACTER CLOACAE COMPLEX: NOT DETECTED
ESCHERICHIA COLI: NOT DETECTED
Enterobacteriaceae species: DETECTED — AB
Enterococcus species: NOT DETECTED
Haemophilus influenzae: NOT DETECTED
KLEBSIELLA OXYTOCA: NOT DETECTED
KLEBSIELLA PNEUMONIAE: DETECTED — AB
Listeria monocytogenes: NOT DETECTED
Neisseria meningitidis: NOT DETECTED
PROTEUS SPECIES: NOT DETECTED
Pseudomonas aeruginosa: NOT DETECTED
Serratia marcescens: NOT DETECTED
Staphylococcus aureus (BCID): NOT DETECTED
Staphylococcus species: NOT DETECTED
Streptococcus agalactiae: NOT DETECTED
Streptococcus pneumoniae: NOT DETECTED
Streptococcus pyogenes: NOT DETECTED
Streptococcus species: NOT DETECTED

## 2017-09-19 LAB — COMPREHENSIVE METABOLIC PANEL
ALBUMIN: 2.7 g/dL — AB (ref 3.5–5.0)
ALT: 50 U/L (ref 14–54)
AST: 31 U/L (ref 15–41)
Alkaline Phosphatase: 120 U/L (ref 38–126)
Anion gap: 18 — ABNORMAL HIGH (ref 5–15)
BUN: 113 mg/dL — ABNORMAL HIGH (ref 6–20)
CHLORIDE: 101 mmol/L (ref 101–111)
CO2: 14 mmol/L — AB (ref 22–32)
CREATININE: 8.97 mg/dL — AB (ref 0.44–1.00)
Calcium: 9.4 mg/dL (ref 8.9–10.3)
GFR calc Af Amer: 5 mL/min — ABNORMAL LOW (ref >60)
GFR calc non Af Amer: 4 mL/min — ABNORMAL LOW (ref >60)
Glucose, Bld: 144 mg/dL — ABNORMAL HIGH (ref 65–99)
Potassium: 4.5 mmol/L (ref 3.5–5.1)
SODIUM: 133 mmol/L — AB (ref 135–145)
Total Bilirubin: 0.8 mg/dL (ref 0.3–1.2)
Total Protein: 6.7 g/dL (ref 6.5–8.1)

## 2017-09-19 LAB — PROTIME-INR
INR: 1.08
Prothrombin Time: 13.9 seconds (ref 11.4–15.2)

## 2017-09-19 LAB — URINALYSIS, ROUTINE W REFLEX MICROSCOPIC
Bilirubin Urine: NEGATIVE
Glucose, UA: NEGATIVE mg/dL
Ketones, ur: NEGATIVE mg/dL
Nitrite: NEGATIVE
PROTEIN: 100 mg/dL — AB
Specific Gravity, Urine: 1.013 (ref 1.005–1.030)
pH: 6 (ref 5.0–8.0)

## 2017-09-19 LAB — CBC
HCT: 34.6 % — ABNORMAL LOW (ref 36.0–46.0)
HEMOGLOBIN: 11.8 g/dL — AB (ref 12.0–15.0)
MCH: 30.1 pg (ref 26.0–34.0)
MCHC: 34.1 g/dL (ref 30.0–36.0)
MCV: 88.3 fL (ref 78.0–100.0)
Platelets: 177 10*3/uL (ref 150–400)
RBC: 3.92 MIL/uL (ref 3.87–5.11)
RDW: 14.4 % (ref 11.5–15.5)
WBC: 14.9 10*3/uL — ABNORMAL HIGH (ref 4.0–10.5)

## 2017-09-19 LAB — I-STAT CG4 LACTIC ACID, ED
Lactic Acid, Venous: 0.87 mmol/L (ref 0.5–1.9)
Lactic Acid, Venous: 0.62 mmol/L (ref 0.5–1.9)

## 2017-09-19 LAB — TSH: TSH: 13.315 u[IU]/mL — ABNORMAL HIGH (ref 0.350–4.500)

## 2017-09-19 LAB — LIPASE, BLOOD: LIPASE: 21 U/L (ref 11–51)

## 2017-09-19 LAB — BRAIN NATRIURETIC PEPTIDE: B Natriuretic Peptide: 251.6 pg/mL — ABNORMAL HIGH (ref 0.0–100.0)

## 2017-09-19 SURGERY — CYSTOSCOPY, WITH RETROGRADE PYELOGRAM AND URETERAL STENT INSERTION
Anesthesia: General | Site: Ureter | Laterality: Bilateral

## 2017-09-19 MED ORDER — IPRATROPIUM-ALBUTEROL 0.5-2.5 (3) MG/3ML IN SOLN
3.0000 mL | RESPIRATORY_TRACT | Status: DC | PRN
  Administered 2017-09-19 – 2017-09-21 (×2): 3 mL via RESPIRATORY_TRACT
  Filled 2017-09-19 (×2): qty 3

## 2017-09-19 MED ORDER — FENTANYL CITRATE (PF) 100 MCG/2ML IJ SOLN: 25.0000 ug | INTRAMUSCULAR | Status: DC | PRN

## 2017-09-19 MED ORDER — ACETAMINOPHEN 160 MG/5ML PO SOLN: 325.0000 mg | ORAL | Status: DC | PRN

## 2017-09-19 MED ORDER — LEVOTHYROXINE SODIUM 25 MCG PO TABS
175.0000 ug | ORAL_TABLET | Freq: Every day | ORAL | Status: DC
  Administered 2017-09-19 – 2017-09-21 (×3): 175 ug via ORAL
  Filled 2017-09-19 (×4): qty 1

## 2017-09-19 MED ORDER — HYDROMORPHONE HCL 1 MG/ML IJ SOLN
0.5000 mg | Freq: Once | INTRAMUSCULAR | Status: AC
  Administered 2017-09-19: 0.5 mg via INTRAVENOUS
  Filled 2017-09-19: qty 1

## 2017-09-19 MED ORDER — ONDANSETRON HCL 4 MG/2ML IJ SOLN
4.0000 mg | Freq: Once | INTRAMUSCULAR | Status: AC
  Administered 2017-09-19: 4 mg via INTRAVENOUS
  Filled 2017-09-19: qty 2

## 2017-09-19 MED ORDER — VITAMIN D (ERGOCALCIFEROL) 1.25 MG (50000 UNIT) PO CAPS
50000.0000 [IU] | ORAL_CAPSULE | ORAL | Status: DC
  Administered 2017-09-26: 50000 [IU] via ORAL
  Filled 2017-09-19: qty 1

## 2017-09-19 MED ORDER — FENTANYL CITRATE (PF) 100 MCG/2ML IJ SOLN
INTRAMUSCULAR | Status: AC
  Filled 2017-09-19: qty 2

## 2017-09-19 MED ORDER — FENOFIBRATE 160 MG PO TABS: 160.0000 mg | ORAL_TABLET | Freq: Every day | ORAL | Status: DC

## 2017-09-19 MED ORDER — LEVALBUTEROL HCL 0.63 MG/3ML IN NEBU
INHALATION_SOLUTION | RESPIRATORY_TRACT | Status: AC
  Filled 2017-09-19: qty 3

## 2017-09-19 MED ORDER — VITAMIN C 500 MG PO TABS
1000.0000 mg | ORAL_TABLET | Freq: Every day | ORAL | Status: DC
  Administered 2017-09-20 – 2017-09-26 (×7): 1000 mg via ORAL
  Filled 2017-09-19 (×7): qty 2

## 2017-09-19 MED ORDER — ONDANSETRON HCL 4 MG/2ML IJ SOLN
INTRAMUSCULAR | Status: DC | PRN
  Administered 2017-09-19: 4 mg via INTRAVENOUS

## 2017-09-19 MED ORDER — SODIUM CHLORIDE 0.9 % IV BOLUS (SEPSIS)
2000.0000 mL | Freq: Once | INTRAVENOUS | Status: AC
  Administered 2017-09-19: 2000 mL via INTRAVENOUS

## 2017-09-19 MED ORDER — IOHEXOL 300 MG/ML  SOLN
INTRAMUSCULAR | Status: DC | PRN
  Administered 2017-09-19: 10 mL via URETHRAL

## 2017-09-19 MED ORDER — ACETAMINOPHEN 325 MG PO TABS: 325.0000 mg | ORAL_TABLET | ORAL | Status: DC | PRN

## 2017-09-19 MED ORDER — ONDANSETRON HCL 4 MG/2ML IJ SOLN: 4.0000 mg | Freq: Once | INTRAMUSCULAR | Status: DC | PRN

## 2017-09-19 MED ORDER — MEPERIDINE HCL 50 MG/ML IJ SOLN: 6.2500 mg | INTRAMUSCULAR | Status: DC | PRN

## 2017-09-19 MED ORDER — ACETAMINOPHEN 650 MG RE SUPP: 650.0000 mg | Freq: Four times a day (QID) | RECTAL | Status: DC | PRN

## 2017-09-19 MED ORDER — SODIUM CHLORIDE 0.9 % IV SOLN
1.0000 g | INTRAVENOUS | Status: DC
  Filled 2017-09-19: qty 10

## 2017-09-19 MED ORDER — PROPOFOL 10 MG/ML IV BOLUS
INTRAVENOUS | Status: AC
  Filled 2017-09-19: qty 20

## 2017-09-19 MED ORDER — HYDROCODONE-ACETAMINOPHEN 5-325 MG PO TABS
1.0000 | ORAL_TABLET | ORAL | Status: DC | PRN
  Administered 2017-09-19 – 2017-09-23 (×7): 2 via ORAL
  Filled 2017-09-19 (×10): qty 2

## 2017-09-19 MED ORDER — LORATADINE 10 MG PO TABS
10.0000 mg | ORAL_TABLET | Freq: Every day | ORAL | Status: DC
  Administered 2017-09-20 – 2017-09-26 (×7): 10 mg via ORAL
  Filled 2017-09-19 (×7): qty 1

## 2017-09-19 MED ORDER — SODIUM CHLORIDE 0.9 % IV SOLN
2.0000 g | Freq: Every day | INTRAVENOUS | Status: DC
  Administered 2017-09-20 – 2017-09-25 (×7): 2 g via INTRAVENOUS
  Filled 2017-09-19 (×2): qty 2
  Filled 2017-09-19: qty 20
  Filled 2017-09-19: qty 2
  Filled 2017-09-19 (×2): qty 20
  Filled 2017-09-19: qty 2
  Filled 2017-09-19: qty 20

## 2017-09-19 MED ORDER — LEVALBUTEROL HCL 0.63 MG/3ML IN NEBU
0.6300 mg | INHALATION_SOLUTION | Freq: Once | RESPIRATORY_TRACT | Status: AC
  Administered 2017-09-19: 0.63 mg via RESPIRATORY_TRACT

## 2017-09-19 MED ORDER — KETOROLAC TROMETHAMINE 30 MG/ML IJ SOLN: 30.0000 mg | Freq: Once | INTRAMUSCULAR | Status: DC | PRN

## 2017-09-19 MED ORDER — SODIUM CHLORIDE 0.9 % IR SOLN
Status: DC | PRN
  Administered 2017-09-19: 3000 mL

## 2017-09-19 MED ORDER — OXYCODONE HCL 5 MG/5ML PO SOLN: 5.0000 mg | Freq: Once | ORAL | Status: DC | PRN

## 2017-09-19 MED ORDER — DEXAMETHASONE SODIUM PHOSPHATE 10 MG/ML IJ SOLN
INTRAMUSCULAR | Status: DC | PRN
  Administered 2017-09-19: 10 mg via INTRAVENOUS

## 2017-09-19 MED ORDER — SODIUM CHLORIDE 0.9 % IV SOLN
2.0000 g | Freq: Once | INTRAVENOUS | Status: AC
  Administered 2017-09-19: 2 g via INTRAVENOUS
  Filled 2017-09-19: qty 20

## 2017-09-19 MED ORDER — FENTANYL CITRATE (PF) 100 MCG/2ML IJ SOLN
50.0000 ug | Freq: Once | INTRAMUSCULAR | Status: AC
  Administered 2017-09-19: 50 ug via INTRAVENOUS
  Filled 2017-09-19: qty 2

## 2017-09-19 MED ORDER — SODIUM CHLORIDE 0.9 % IV BOLUS (SEPSIS): 1000.0000 mL | Freq: Once | INTRAVENOUS | Status: DC

## 2017-09-19 MED ORDER — SODIUM CHLORIDE 0.9 % IV SOLN
INTRAVENOUS | Status: DC
  Administered 2017-09-19 (×3): via INTRAVENOUS

## 2017-09-19 MED ORDER — FENTANYL CITRATE (PF) 100 MCG/2ML IJ SOLN
INTRAMUSCULAR | Status: DC | PRN
  Administered 2017-09-19 (×4): 25 ug via INTRAVENOUS

## 2017-09-19 MED ORDER — OXYCODONE HCL 5 MG PO TABS: 5.0000 mg | ORAL_TABLET | Freq: Once | ORAL | Status: DC | PRN

## 2017-09-19 MED ORDER — SODIUM CHLORIDE 0.9 % IV SOLN: 2.0000 g | INTRAVENOUS | Status: DC

## 2017-09-19 MED ORDER — ACETAMINOPHEN 325 MG PO TABS
650.0000 mg | ORAL_TABLET | Freq: Four times a day (QID) | ORAL | Status: DC | PRN
  Administered 2017-09-25 (×2): 650 mg via ORAL
  Filled 2017-09-19 (×2): qty 2

## 2017-09-19 SURGICAL SUPPLY — 23 items
BAG URINE DRAINAGE (UROLOGICAL SUPPLIES) ×2 IMPLANT
BAG URO CATCHER STRL LF (MISCELLANEOUS) ×3 IMPLANT
CATH FOLEY 2WAY SLVR  5CC 16FR (CATHETERS) ×2
CATH FOLEY 2WAY SLVR 5CC 16FR (CATHETERS) IMPLANT
CATH INTERMIT  6FR 70CM (CATHETERS) ×3 IMPLANT
CLOTH BEACON ORANGE TIMEOUT ST (SAFETY) ×3 IMPLANT
COVER FOOTSWITCH UNIV (MISCELLANEOUS) ×2 IMPLANT
EXTRACTOR STONE NITINOL NGAGE (UROLOGICAL SUPPLIES) IMPLANT
FIBER LASER TRAC TIP (UROLOGICAL SUPPLIES) IMPLANT
GLOVE BIO SURGEON STRL SZ8 (GLOVE) ×3 IMPLANT
GOWN STRL REUS W/TWL XL LVL3 (GOWN DISPOSABLE) ×3 IMPLANT
GUIDEWIRE ANG ZIPWIRE 038X150 (WIRE) ×3 IMPLANT
GUIDEWIRE STR DUAL SENSOR (WIRE) ×2 IMPLANT
IV NS 1000ML (IV SOLUTION) ×3
IV NS 1000ML BAXH (IV SOLUTION) ×1 IMPLANT
MANIFOLD NEPTUNE II (INSTRUMENTS) ×3 IMPLANT
PACK CYSTO (CUSTOM PROCEDURE TRAY) ×3 IMPLANT
SHEATH URETERAL 12FRX35CM (MISCELLANEOUS) IMPLANT
STENT CONTOUR 6FRX26X.038 (STENTS) ×2 IMPLANT
SYR CONTROL 10ML LL (SYRINGE) ×2 IMPLANT
TUBE FEEDING 8FR 16IN STR KANG (MISCELLANEOUS) IMPLANT
TUBING CONNECTING 10 (TUBING) ×2 IMPLANT
TUBING CONNECTING 10' (TUBING) ×1

## 2017-09-19 NOTE — ED Notes (Signed)
EKG given to EDP,Glick,MD., for review. 

## 2017-09-19 NOTE — Plan of Care (Addendum)
Received signout from Margarita Mail, PA-C 62 year old female presents with symptoms of nausea, vomiting, diarrhea which initially started 6 days ago.  Diarrhea symptoms have resolved, but nausea vomiting persist.  Labs revealed WBC 14.9, hemoglobin 11.8, BUN 113, and creatinine 8.97.  UA positive for infection.  Chest x-ray with cardiomegaly concerning for bronchial thickening over edema.  Patient started on antibiotics of Rocephin and have been given a total of 4 L of normal saline IV fluids.  TRH paged to admit  for acute renal failure.  Asked the PA-C to check CT scan without contrast to rule out obstruction.  CT scan showing bilateral nephrolithiasis with some hydronephrosis.  Suspect acute renal failure multifactorial including dehydration and obstruction.  Urology to be paged consulted to see by Kenton Kingfisher.  Added on BNP question fluid overload.  Urology to take patient to OR this a.m. see Kenton Kingfisher PA-C note.

## 2017-09-19 NOTE — Progress Notes (Signed)
Pharmacy Antibiotic Note  Sheila Leach is a 62 y.o. female admitted on 09/19/2017 with UTI.  Pharmacy has been consulted for Rocephin dosing.  Plan: Rocephin 1g IV daily Will sign off  Height: 5\' 8"  (172.7 cm) Weight: 282 lb (127.9 kg) IBW/kg (Calculated) : 63.9  Temp (24hrs), Avg:98 F (36.7 C), Min:97.8 F (36.6 C), Max:98.3 F (36.8 C)  Recent Labs  Lab 09/18/17 2349 09/19/17 0130 09/19/17 0410  WBC 14.9*  --   --   CREATININE 8.97*  --   --   LATICACIDVEN  --  0.87 0.62    Estimated Creatinine Clearance: 9.2 mL/min (A) (by C-G formula based on SCr of 8.97 mg/dL (H)).    Allergies  Allergen Reactions  . Band-Aid Plus Antibiotic [Bacitracin-Polymyxin B]     Plastic band-aids cause redness  . Iodine Swelling    Face and lips      Thank you for allowing pharmacy to be a part of this patient's care.  Sheila Leach, Sheila Leach 09/19/2017 8:12 AM

## 2017-09-19 NOTE — Op Note (Signed)
Preoperative diagnosis: bilateral ureteral calculi  Postoperative diagnosis: Same  Procedure: 1 cystoscopy 2. Bilateral retrograde pyelography 3.  Intraoperative fluoroscopy, under one hour, with interpretation 4.  right 6 x 26 JJ stent placement  Attending: Nicolette Bang, MD  Resident: Ammie Dalton, MD  Anesthesia: General  Estimated blood loss: None  Drains:right 6 x 26 JJ ureteral stent without tether  Specimens: urine for culture  Antibiotics: rocephin  Findings: bilateral ureteral stones. Right distal ureteral calculus and left proximal ureteral calculus. Unable to place stent on the left.  Mild right hydronephrosis, severe left hydronephrosis. No masses/lesions in the bladder. Ureteral orifices in normal anatomic location.  Indications: Patient is a 62 year old female/female with a history of bilateral ureteral stone, elevated creatinine, and UTI. After discussing treatment options, they decided proceed with bilateral stent placement.  Procedure her in detail: The patient was brought to the operating room and a brief timeout was done to ensure correct patient, correct procedure, correct site.  General anesthesia was administered patient was placed in dorsal lithotomy position.  Her genitalia was then prepped and draped in usual sterile fashion.  A rigid 62 French cystoscope was passed in the urethra and the bladder.  Bladder was inspected free masses or lesions.  the ureteral orifices were in the normal orthotopic locations. a 6 french ureteral catheter was then instilled into the left ureteral orifice.  a gentle retrograde was obtained and findings noted above. We then advanced a zipwire up to the stone. After multiple attempts and use of a sensor wire we were unable to advance a wire past the proximal ureteral calculus. We then turned out attention to the right side. a 6 french ureteral catheter was then instilled into the right ureteral orifice.  a gentle retrograde was  obtained and findings noted above. We then advanced a zipwire up to the renal pelvis. we then placed a 6 x 26 double-j ureteral stent over the original zip wire.  We then removed the wire and good coil was noted in the the renal pelvis under fluoroscopy and the bladder under direct vision.  the bladder was then drained and this concluded the procedure which was well tolerated by patient.  Complications: None  Condition: Stable, extubated, transferred to PACU  Plan: The patient will be admitted for IV antibiotics. She will need to have a left nephrostomy tube placed due to inability to place a left ureteral stent.

## 2017-09-19 NOTE — Transfer of Care (Signed)
Immediate Anesthesia Transfer of Care Note  Patient: Sheila Leach  Procedure(s) Performed: Procedure(s): CYSTOSCOPY WITH BILATERAL RETROGRADE PYELOGRAM/RIGHT URETERAL STENT PLACEMENT (Bilateral)  Patient Location: PACU  Anesthesia Type:General  Level of Consciousness:  sedated, patient cooperative and responds to stimulation  Airway & Oxygen Therapy:Patient Spontanous Breathing and Patient connected to face mask oxgen  Post-op Assessment:  Report given to PACU RN and Post -op Vital signs reviewed and stable  Post vital signs:  Reviewed and stable  Last Vitals:  Vitals:   09/19/17 0701 09/19/17 0800  BP: 101/79 127/90  Pulse: (!) 125 (!) 122  Resp: (!) 25 (!) 34  Temp:    SpO2: 23% 46%    Complications: No apparent anesthesia complications

## 2017-09-19 NOTE — Anesthesia Procedure Notes (Signed)
Procedure Name: LMA Insertion Date/Time: 09/19/2017 9:36 AM Performed by: Anne Fu, CRNA Pre-anesthesia Checklist: Patient identified, Emergency Drugs available, Suction available, Patient being monitored and Timeout performed Patient Re-evaluated:Patient Re-evaluated prior to induction Oxygen Delivery Method: Circle system utilized Preoxygenation: Pre-oxygenation with 100% oxygen Induction Type: IV induction Ventilation: Mask ventilation without difficulty LMA: LMA inserted LMA Size: 4.0 Number of attempts: 1 Placement Confirmation: positive ETCO2 and breath sounds checked- equal and bilateral Tube secured with: Tape

## 2017-09-19 NOTE — Anesthesia Postprocedure Evaluation (Signed)
Anesthesia Post Note  Patient: Jerie Basford  Procedure(s) Performed: CYSTOSCOPY WITH BILATERAL RETROGRADE PYELOGRAM/RIGHT URETERAL STENT PLACEMENT (Bilateral Ureter)     Patient location during evaluation: PACU Anesthesia Type: General Level of consciousness: awake and alert Pain management: pain level controlled Vital Signs Assessment: post-procedure vital signs reviewed and stable Respiratory status: spontaneous breathing, nonlabored ventilation, respiratory function stable and patient connected to nasal cannula oxygen Cardiovascular status: blood pressure returned to baseline and stable Postop Assessment: no apparent nausea or vomiting Anesthetic complications: no    Last Vitals:  Vitals:   09/19/17 1145 09/19/17 1332  BP: 132/77 (!) 104/59  Pulse: (!) 110 (!) 109  Resp: (!) 26 19  Temp:  36.7 C  SpO2: 95% 93%    Last Pain:  Vitals:   09/19/17 1332  TempSrc: Oral  PainSc:                  Jurgen Groeneveld

## 2017-09-19 NOTE — Treatment Plan (Addendum)
Patient underwent cystourethroscopy, right ureteral stent placement. Unable to place stent on LEFT due to significant blockage from chronic stone in proximal ureter. Left proximal ureteral extravasation of contrast noted following attempt. I consulted VIR for left percutaneous nephrostomy tube placement on Monday 2/17. If patient becomes febrile or septic today, procedure will need to be done emergently.   NPO after midnight tonight. INR ordered for IR. Hold all anticoagulation until procedure.   Jonna Clark, MD Urologic Surgery PGY4

## 2017-09-19 NOTE — Progress Notes (Signed)
PHARMACY NOTE -  ANTIBIOTIC RENAL DOSE ADJUSTMENT   Request received for Pharmacy to assist with antibiotic renal dose adjustment.  Patient has been initiated on Ceftriaxone 2gm iv q24hr  for UTI. SCr 8.97, estimated CrCl 9.2 ml/min Current dosage is appropriate and need for further dosage adjustment appears unlikely at present. Will sign off at this time.  Please reconsult if a change in clinical status warrants re-evaluation of dosage.

## 2017-09-19 NOTE — Anesthesia Preprocedure Evaluation (Addendum)
Anesthesia Evaluation  Patient identified by MRN, date of birth, ID band Patient awake    Reviewed: Allergy & Precautions, NPO status , Patient's Chart, lab work & pertinent test results  Airway Mallampati: III  TM Distance: >3 FB Neck ROM: Full    Dental no notable dental hx.    Pulmonary asthma ,    Pulmonary exam normal breath sounds clear to auscultation       Cardiovascular hypertension, Pt. on medications Normal cardiovascular exam Rhythm:Regular Rate:Normal     Neuro/Psych Hx of radiation therapy 6/6/, 6/21, 6/24, 02/07/2009 brachytherapy Weakness  left leg weakness     GI/Hepatic negative GI ROS, Neg liver ROS,   Endo/Other  Hypothyroidism   Renal/GU ARFRenal disease   Malignant neoplasm of corpus uteri, except isthmus     Musculoskeletal  (+) Arthritis , Osteoarthritis,    Abdominal   Peds negative pediatric ROS (+)  Hematology negative hematology ROS (+)   Anesthesia Other Findings   Reproductive/Obstetrics negative OB ROS                           Anesthesia Physical Anesthesia Plan  ASA: III and emergent  Anesthesia Plan: General   Post-op Pain Management:    Induction: Intravenous  PONV Risk Score and Plan: 3 and Ondansetron, Dexamethasone and Treatment may vary due to age or medical condition  Airway Management Planned: LMA  Additional Equipment:   Intra-op Plan:   Post-operative Plan: Extubation in OR  Informed Consent: I have reviewed the patients History and Physical, chart, labs and discussed the procedure including the risks, benefits and alternatives for the proposed anesthesia with the patient or authorized representative who has indicated his/her understanding and acceptance.     Plan Discussed with: CRNA and Anesthesiologist  Anesthesia Plan Comments: (  )       Anesthesia Quick Evaluation

## 2017-09-19 NOTE — ED Notes (Signed)
Patient transported to X-ray 

## 2017-09-19 NOTE — Progress Notes (Signed)
Patient arrived on unit via stretcher from PACU.  Family at bedside.  Telemetry placed per MD order and CMT notified.

## 2017-09-19 NOTE — ED Provider Notes (Signed)
[facility] DEPT Provider Note   CSN: [id number] Arrival date & time: 09/18/17  2130     History   Chief Complaint Chief Complaint  Patient presents with  . Hematuria  . Emesis    HPI Sheila Leach is a 62 y.o. female who presents the emergency department chief complaint of nausea and vomiting.  Patient states she had sudden onset of diarrhea and nausea and vomiting 6 days ago.  She states that her diarrhea resolved however she has had persistent vomiting over the past week.  Today she noticed blood in her urine and came to the emergency department.  She denies cough fever or chills.  She has no current abdominal pain but has persistent mild nausea.  She has been coughing up some phlegm.  She feels weak.  She denies back pain.  HPI  Past Medical History:  Diagnosis Date  . Arthritis    back  . Asthma   . Back injury   . Cancer ([place])    uterine  . Endometrial cancer ([place])    last radiation in 2010/no chemo  . Hx of radiation therapy 6/6/, 6/21, 6/24, 02/07/2009   brachytherapy  . Hyperlipidemia   . Hypertension   . Kidney stone    2016  did not pass  . Osteoporosis   . Personal history of colonic adenomas 01/24/2013  . Seasonal allergies   . Thyroid disease   . Weakness    left sided weakness    Patient Active Problem List   Diagnosis Date Noted  . Endometrial cancer ([place])   . Personal history of colonic adenomas 01/24/2013  . Malignant neoplasm of corpus uteri, except isthmus ([place]) 03/02/2012    Past Surgical History:  Procedure Laterality Date  . ABDOMINAL HYSTERECTOMY     Total  . DILATION AND CURETTAGE OF UTERUS     fibroids  . THYROIDECTOMY     childhood    OB History    No data available       Home Medications    Prior to Admission medications   Medication Sig Start Date End Date Taking? Authorizing Provider  acetaminophen (TYLENOL) 325 MG tablet Take 650 mg by mouth every 6 (six) hours as needed for mild pain.     Yes [provider]  albuterol (PROVENTIL HFA;VENTOLIN HFA) 108 (90 BASE) MCG/ACT inhaler Inhale 2 puffs into the lungs every 6 (six) hours as needed for wheezing or shortness of breath.   Yes [provider]  Ascorbic Acid (VITAMIN C) 1000 MG tablet Take 1,000 mg by mouth daily.    Yes [provider]  cetirizine (ZYRTEC) 10 MG tablet Take 10 mg by mouth daily.   Yes [provider]  ergocalciferol (VITAMIN D2) 50000 UNITS capsule Take 50,000 Units by mouth every Sunday.    Yes [provider]  fenofibrate 160 MG tablet Take 160 mg by mouth daily. 09/15/17  Yes [provider]  ibuprofen (ADVIL,MOTRIN) 200 MG tablet Take 400 mg by mouth every 6 (six) hours as needed for headache, mild pain or moderate pain.    Yes [provider]  levothyroxine (SYNTHROID, LEVOTHROID) 175 MCG tablet Take 175 mcg by mouth daily before breakfast.   Yes [provider]  lisinopril (PRINIVIL,ZESTRIL) 20 MG tablet Take 40 mg by mouth daily.    Yes [provider]  Multiple Vitamins-Minerals (MULTIVITAMIN WITH MINERALS) tablet Take 1 tablet by mouth daily.   Yes [provider]    Family  History Family History  Problem Relation Age of Onset  . Heart disease Mother   . Diabetes Mother   . Kidney disease Mother   . Heart disease Father   . Thyroid cancer Sister   . Esophageal cancer Brother   . Diabetes Brother   . Liver disease Brother   . Colon cancer Neg Hx   . Rectal cancer Neg Hx   . Stomach cancer Neg Hx     Social History Social History   Tobacco Use  . Smoking status: Never Smoker  . Smokeless tobacco: Never Used  Substance Use Topics  . Alcohol use: No  . Drug use: No     Allergies   Band-aid plus antibiotic [bacitracin-polymyxin b] and Iodine   Review of Systems Review of Systems  Ten systems reviewed and are negative for acute change, except as noted in the HPI.   Physical Exam Updated Vital  Signs BP 131/79 (BP Location: Left Arm)   Pulse (!) 124   Temp 97.8 F (36.6 C) (Oral)   Resp (!) 27   Ht 5\' 8"  (1.727 m)   Wt 127.9 kg (282 lb)   SpO2 94%   BMI 42.88 kg/m   Physical Exam  Constitutional: She is oriented to person, place, and time. She appears well-developed. No distress.  Appears ill  HENT:  Head: Normocephalic and atraumatic.  Dry oral mucosa  Eyes: Conjunctivae are normal. No scleral icterus.  Neck: Normal range of motion.  Cardiovascular: Regular rhythm and normal heart sounds. Exam reveals no gallop and no friction rub.  No murmur heard. Tachycardic  Pulmonary/Chest: Effort normal and breath sounds normal. No respiratory distress.  Abdominal: Soft. Bowel sounds are normal. She exhibits no distension and no mass. There is no tenderness. There is no guarding.  Neurological: She is alert and oriented to person, place, and time.  Skin: Skin is warm and dry. She is not diaphoretic.  Psychiatric: Her behavior is normal.  Nursing note and vitals reviewed.    ED Treatments / Results  Labs (all labs ordered are listed, but only abnormal results are displayed) Labs Reviewed  COMPREHENSIVE METABOLIC PANEL - Abnormal; Notable for the following components:      Result Value   Sodium 133 (*)    CO2 14 (*)    Glucose, Bld 144 (*)    BUN 113 (*)    Creatinine, Ser 8.97 (*)    Albumin 2.7 (*)    GFR calc non Af Amer 4 (*)    GFR calc Af Amer 5 (*)    Anion gap 18 (*)    All other components within normal limits  CBC - Abnormal; Notable for the following components:   WBC 14.9 (*)    Hemoglobin 11.8 (*)    HCT 34.6 (*)    All other components within normal limits  URINALYSIS, ROUTINE W REFLEX MICROSCOPIC - Abnormal; Notable for the following components:   Color, Urine AMBER (*)    APPearance CLOUDY (*)    Hgb urine dipstick LARGE (*)    Protein, ur 100 (*)    Leukocytes, UA MODERATE (*)    Bacteria, UA MANY (*)    Squamous Epithelial / LPF 0-5 (*)     Non Squamous Epithelial 0-5 (*)    All other components within normal limits  CULTURE, BLOOD (ROUTINE X 2)  CULTURE, BLOOD (ROUTINE X 2)  LIPASE, BLOOD  I-STAT CG4 LACTIC ACID, ED  I-STAT CG4 LACTIC ACID, ED  EKG  EKG Interpretation  Date/Time:  Sunday September 19 2017 01:43:48 EST Ventricular Rate:  113 PR Interval:    QRS Duration: 79 QT Interval:  315 QTC Calculation: 432 R Axis:   0 Text Interpretation:  Sinus tachycardia Low voltage, precordial leads When compared with ECG of 06/23/2014, HEART RATE has decreased Confirmed by Delora Fuel ([id number]) on 09/19/2017 1:46:45 AM       Radiology Ct Abdomen Pelvis Wo Contrast  Result Date: 09/19/2017 CLINICAL DATA:  Abdominal pain and hematuria. EXAM: CT ABDOMEN AND PELVIS WITHOUT CONTRAST TECHNIQUE: Multidetector CT imaging of the abdomen and pelvis was performed following the standard protocol without IV contrast. COMPARISON:  CT 09/25/2014 FINDINGS: Lower chest: Upper normal heart size. Bilateral lower lobe atelectasis, breathing motion artifact. Hepatobiliary: The liver is enlarged spanning 22.5 cm with diffuse steatosis. No evidence of focal lesion allowing for lack contrast. Possible sludge in the gallbladder which is physiologically distended. No calcified stone. No biliary dilatation. Pancreas: No ductal dilatation or inflammation. Spleen: Enlarged measuring 16.3 cm AP. Adrenals/Urinary Tract: No adrenal nodule. Obstructing 7 mm stone in the distal right ureter just proximal to the ureterovesicular junction with mild right hydroureteronephrosis and perinephric edema. Two additional nonobstructing stones in the upper right kidney. Probable cyst in the mid right kidney. Chronic 15 mm stone at the left ureteropelvic junction with chronic severe hydronephrosis. Progressive parenchymal thinning from prior exam. No left perinephric edema. Nonobstructing stone versus parenchymal calcification in the mid left kidney. Urinary bladder is  completely decompressed. Stomach/Bowel: Multifocal colonic diverticulosis without diverticulitis. Normal appendix. No bowel obstruction, inflammation or wall thickening. Vascular/Lymphatic: Aortic atherosclerosis. Retroaortic left renal vein. Increased number of multiple small retroperitoneal lymph nodes. Largest node measures 11 mm short axis in the pericaval region at the level of the right kidney. Small periportal nodes are likely reactive. Reproductive: Status post hysterectomy. No adnexal masses. Other: No free air, free fluid, or intra-abdominal fluid collection. Musculoskeletal: Multilevel degenerative change throughout the lumbar spine. There are no acute or suspicious osseous abnormalities. IMPRESSION: 1. Obstructing 7 mm stone in the distal right ureter just proximal to the ureteropelvic junction with mild hydroureteronephrosis and perinephric edema. 2. 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 3. Additional nonobstructing right renal calculi. 4. Hepatosplenomegaly and hepatic steatosis. 5. Colonic diverticulosis without diverticulitis. 6.  Aortic Atherosclerosis (ICD10-I70.0). Electronically Signed   By: Jeb Levering M.D.   On: 09/19/2017 05:42   Dg Chest 2 View  Result Date: 09/19/2017 CLINICAL DATA:  Nausea. EXAM: CHEST  2 VIEW COMPARISON:  06/24/2014 FINDINGS: Very low lung volumes on the AP view limit assessment. The heart is enlarged. Bronchovascular crowding with peribronchial cuffing. No confluent airspace disease. No large pleural effusion. No pneumothorax. Body habitus limits detailed assessment. Chronic change about the left shoulder. IMPRESSION: Cardiomegaly. Peribronchial cuffing favored to be bronchial inflammation over pulmonary edema. Electronically Signed   By: Jeb Levering M.D.   On: 09/19/2017 02:57    Procedures Procedures (including critical care time)  Medications  Ordered in ED Medications  ondansetron (ZOFRAN-ODT) disintegrating tablet 4 mg (not administered)  cefTRIAXone (ROCEPHIN) 2 g in sodium chloride 0.9 % 100 mL IVPB (not administered)  cefTRIAXone (ROCEPHIN) 2 g in sodium chloride 0.9 % 100 mL IVPB (0 g Intravenous Stopped 09/19/17 0344)  ondansetron (ZOFRAN) injection 4 mg (4 mg Intravenous Given 09/19/17 0251)  sodium chloride 0.9 % bolus 2,000 mL (0 mLs Intravenous  Stopped 09/19/17 0344)  fentaNYL (SUBLIMAZE) injection 50 mcg (50 mcg Intravenous Given 09/19/17 0314)  sodium chloride 0.9 % bolus 2,000 mL (0 mLs Intravenous Stopped 09/19/17 0559)  HYDROmorphone (DILAUDID) injection 0.5 mg (0.5 mg Intravenous Given 09/19/17 0509)     Initial Impression / Assessment and Plan / ED Course  I have reviewed the triage vital signs and the nursing notes.  Pertinent labs & imaging results that were available during my care of the patient were reviewed by me and considered in my medical decision making (see chart for details).  Clinical Course as of Sep 19 613  Sun Sep 19, 2017  0148 Patient's lactic acid is normal.  I will not begin full weight-based fluid resuscitation as she is not hypotensive. Lactic Acid, Venous: 0.87 [AH]  0239 Patient with acute kidney injury, market elevation in her creatinine and BUN. Creatinine: (!) 8.97 [AH]    Clinical Course User Index [AH] Margarita Mail, PA-C    Patient appears to have acute renal failure.  She does not appear to be septic.  She has a negative lactate.  I do suspect market dehydration.  Patient received 30 mL/kg of fluid and will be admitted.  She is being treated for her urinary tract infection with IV Rocephin.  Throughout the course of her ED developed visit she began to develop pain in her lower abdomen.  I ordered a noncontrast CT scan.  Final Clinical Impressions(s) / ED Diagnoses   Final diagnoses:  Acute renal failure, unspecified acute renal failure type ([place])  Non-intractable vomiting  without nausea, unspecified vomiting type  Urinary tract infection with hematuria, site unspecified    ED Discharge Orders    None       Margarita Mail, PA-C 75/05/18 [id number]    Delora Fuel, MD 25/18/98 [phone]

## 2017-09-19 NOTE — Consult Note (Signed)
Urology Consult Note    Requesting Attending Physician:  Delora Fuel, MD Service Requesting Consult:  Internal Medicine  Service Providing Consult: Urology  Consulting Attending: Dr. Nicolette Bang    Reason for Consult:  Bilateral obstructing nephrolithiasis   Sheila Leach is seen in consultation for reasons noted above at the request of Delora Fuel, MD on the Hospitalist service .  This is a 62 y.o. yo patient with a history of nephrolithiasis and is a patient of Dr. Simone Curia. She was last seen in clinic in 2016 for microscopic hematuria, where she was found to have a known proximal ureteral L obstructing stone ~1.2cm causing severe atrophy and marked hydronephrosis of her left kidney. At that time, stone was not causing symptoms, and creatinine was normal at 1.15. Renogram showed 8% function of her left kidney, but due to lack of symptoms they elected surveillance. She had no nephrolithiasis on her right side.   She presented to the ED this morning with 3 days of severe nausea and vomiting. She denies chills, fevers. Denies dysuria and hematuria. Denies flank pain, although patient states she has slipped discs and thus has chronic midline back pain. During workup, vitals were notable for tachycardia, labs were notable for a leukocytosis (14.9), a frankly infected appearing UA, and a creatinine of 8.97 (GFR 4). Lactate 0.62. CT abdomen pelvis was performed which showed known left hydronephrosis and obstructing stone, but an additional 84mm stone in the distal right ureter with mild hydronephrosis and perinephric edema. Additional non obstructing right renal stones noted.   Urology was consulted due to obstructing bilateral stones.   Past Medical History: Past Medical History:  Diagnosis Date  . Arthritis    back  . Asthma   . Back injury   . Cancer ([place])    uterine  . Endometrial cancer ([place])    last radiation in 2010/no chemo  . Hx of radiation therapy 6/6/, 6/21, 6/24, 02/07/2009    brachytherapy  . Hyperlipidemia   . Hypertension   . Kidney stone    2016  did not pass  . Osteoporosis   . Personal history of colonic adenomas 01/24/2013  . Seasonal allergies   . Thyroid disease   . Weakness    left sided weakness    Past Surgical History:  Past Surgical History:  Procedure Laterality Date  . ABDOMINAL HYSTERECTOMY     Total  . DILATION AND CURETTAGE OF UTERUS     fibroids  . THYROIDECTOMY     childhood    Medication: Current Facility-Administered Medications  Medication Dose Route Frequency Provider Last Rate Last Dose  . 0.9 %  sodium chloride infusion  500 mL Intravenous Continuous Gatha Mayer, MD      . cefTRIAXone (ROCEPHIN) 2 g in sodium chloride 0.9 % 100 mL IVPB  2 g Intravenous Q11H Delora Fuel, MD      . ondansetron (ZOFRAN-ODT) disintegrating tablet 4 mg  4 mg Oral Once PRN Delora Fuel, MD       Current Outpatient Medications  Medication Sig Dispense Refill  . acetaminophen (TYLENOL) 325 MG tablet Take 650 mg by mouth every 6 (six) hours as needed for mild pain.     Marland Kitchen albuterol (PROVENTIL HFA;VENTOLIN HFA) 108 (90 BASE) MCG/ACT inhaler Inhale 2 puffs into the lungs every 6 (six) hours as needed for wheezing or shortness of breath.    . Ascorbic Acid (VITAMIN C) 1000 MG tablet Take 1,000 mg by mouth daily.     Marland Kitchen  cetirizine (ZYRTEC) 10 MG tablet Take 10 mg by mouth daily.    . ergocalciferol (VITAMIN D2) 50000 UNITS capsule Take 50,000 Units by mouth every Sunday.     . fenofibrate 160 MG tablet Take 160 mg by mouth daily.    Marland Kitchen ibuprofen (ADVIL,MOTRIN) 200 MG tablet Take 400 mg by mouth every 6 (six) hours as needed for headache, mild pain or moderate pain.     Marland Kitchen levothyroxine (SYNTHROID, LEVOTHROID) 175 MCG tablet Take 175 mcg by mouth daily before breakfast.    . lisinopril (PRINIVIL,ZESTRIL) 20 MG tablet Take 40 mg by mouth daily.     . Multiple Vitamins-Minerals (MULTIVITAMIN WITH MINERALS) tablet Take 1 tablet by mouth daily.       Allergies: Allergies  Allergen Reactions  . Band-Aid Plus Antibiotic [Bacitracin-Polymyxin B]     Plastic band-aids cause redness  . Iodine Swelling    Face and lips     Social History: Social History   Tobacco Use  . Smoking status: Never Smoker  . Smokeless tobacco: Never Used  Substance Use Topics  . Alcohol use: No  . Drug use: No    Family History Family History  Problem Relation Age of Onset  . Heart disease Mother   . Diabetes Mother   . Kidney disease Mother   . Heart disease Father   . Thyroid cancer Sister   . Esophageal cancer Brother   . Diabetes Brother   . Liver disease Brother   . Colon cancer Neg Hx   . Rectal cancer Neg Hx   . Stomach cancer Neg Hx     Review of Systems 10 systems were reviewed and are negative except as noted specifically in the HPI.  Objective   Vital signs in last 24 hours: BP 101/79 (BP Location: Left Arm)   Pulse (!) 125   Temp 97.9 F (36.6 C) (Oral)   Resp (!) 25   Ht 5\' 8"  (1.727 m)   Wt 127.9 kg (282 lb)   SpO2 95%   BMI 42.88 kg/m   Intake/Output last 3 shifts: I/O last 3 completed shifts: In: 4160 [P.O.:60; IV Piggyback:4100] Out: 250 [Urine:250]  Physical Exam General: NAD, A&O, resting, appropriate HEENT: [place]/AT, EOMI, MMM Pulmonary: Normal work of breathing on [place]  Cardiovascular: HDS, adequate peripheral perfusion Abdomen: soft, NTTP, nondistended, obese. No suprapubic fullness or tenderness GU: bilateral mild CVA tenderness DRE: deferred  Extremities: warm and well perfused, no edema Neuro: Appropriate, no focal neurological deficits  Most Recent Labs: Lab Results  Component Value Date   WBC 14.9 (H) 09/18/2017   HGB 11.8 (L) 09/18/2017   HCT 34.6 (L) 09/18/2017   PLT 177 09/18/2017    Lab Results  Component Value Date   NA 133 (L) 09/18/2017   K 4.5 09/18/2017   CL 101 09/18/2017   CO2 14 (L) 09/18/2017   BUN 113 (H) 09/18/2017   CREATININE 8.97 (H) 09/18/2017   CALCIUM 9.4  09/18/2017    Lab Results  Component Value Date   ALKPHOS 120 09/18/2017   BILITOT 0.8 09/18/2017   PROT 6.7 09/18/2017   ALBUMIN 2.7 (L) 09/18/2017   ALT 50 09/18/2017   AST 31 09/18/2017    No results found for: INR, APTT    Urine Culture: Pending   Urinalysis:  Color, Urine YELLOW AMBER Abnormal    Comment: BIOCHEMICALS MAY BE AFFECTED BY COLOR  APPearance CLEAR CLOUDY Abnormal    Specific Gravity, Urine 1.005 - 1.030 1.013  pH 5.0 - 8.0 6.0   Glucose, UA NEGATIVE mg/dL NEGATIVE   Hgb urine dipstick NEGATIVE LARGE Abnormal    Bilirubin Urine NEGATIVE NEGATIVE   Ketones, ur NEGATIVE mg/dL NEGATIVE   Protein, ur NEGATIVE mg/dL 100 Abnormal    Nitrite NEGATIVE NEGATIVE   Leukocytes, UA NEGATIVE MODERATE Abnormal    RBC / HPF 0 - 5 RBC/hpf TOO NUMEROUS TO COUNT   WBC, UA 0 - 5 WBC/hpf TOO NUMEROUS TO COUNT   Bacteria, UA NONE SEEN MANY Abnormal    Squamous Epithelial / LPF NONE SEEN 0-5 Abnormal    Non Squamous Epithelial NONE SEEN 0-5 Abnormal       IMAGING: Ct Abdomen Pelvis Wo Contrast  Result Date: 09/19/2017 CLINICAL DATA:  Abdominal pain and hematuria. EXAM: CT ABDOMEN AND PELVIS WITHOUT CONTRAST TECHNIQUE: Multidetector CT imaging of the abdomen and pelvis was performed following the standard protocol without IV contrast. COMPARISON:  CT 09/25/2014 FINDINGS: Lower chest: Upper normal heart size. Bilateral lower lobe atelectasis, breathing motion artifact. Hepatobiliary: The liver is enlarged spanning 22.5 cm with diffuse steatosis. No evidence of focal lesion allowing for lack contrast. Possible sludge in the gallbladder which is physiologically distended. No calcified stone. No biliary dilatation. Pancreas: No ductal dilatation or inflammation. Spleen: Enlarged measuring 16.3 cm AP. Adrenals/Urinary Tract: No adrenal nodule. Obstructing 7 mm stone in the distal right ureter just proximal to the ureterovesicular junction with mild right hydroureteronephrosis and  perinephric edema. Two additional nonobstructing stones in the upper right kidney. Probable cyst in the mid right kidney. Chronic 15 mm stone at the left ureteropelvic junction with chronic severe hydronephrosis. Progressive parenchymal thinning from prior exam. No left perinephric edema. Nonobstructing stone versus parenchymal calcification in the mid left kidney. Urinary bladder is completely decompressed. Stomach/Bowel: Multifocal colonic diverticulosis without diverticulitis. Normal appendix. No bowel obstruction, inflammation or wall thickening. Vascular/Lymphatic: Aortic atherosclerosis. Retroaortic left renal vein. Increased number of multiple small retroperitoneal lymph nodes. Largest node measures 11 mm short axis in the pericaval region at the level of the right kidney. Small periportal nodes are likely reactive. Reproductive: Status post hysterectomy. No adnexal masses. Other: No free air, free fluid, or intra-abdominal fluid collection. Musculoskeletal: Multilevel degenerative change throughout the lumbar spine. There are no acute or suspicious osseous abnormalities. IMPRESSION: 1. Obstructing 7 mm stone in the distal right ureter just proximal to the ureteropelvic junction with mild hydroureteronephrosis and perinephric edema. 2. 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 3. Additional nonobstructing right renal calculi. 4. Hepatosplenomegaly and hepatic steatosis. 5. Colonic diverticulosis without diverticulitis. 6.  Aortic Atherosclerosis (ICD10-I70.0). Electronically Signed   By: Jeb Levering M.D.   On: 09/19/2017 05:42   Dg Chest 2 View  Result Date: 09/19/2017 CLINICAL DATA:  Nausea. EXAM: CHEST  2 VIEW COMPARISON:  06/24/2014 FINDINGS: Very low lung volumes on the AP view limit assessment. The heart is enlarged. Bronchovascular crowding with peribronchial cuffing. No confluent airspace  disease. No large pleural effusion. No pneumothorax. Body habitus limits detailed assessment. Chronic change about the left shoulder. IMPRESSION: Cardiomegaly. Peribronchial cuffing favored to be bronchial inflammation over pulmonary edema. Electronically Signed   By: Jeb Levering M.D.   On: 09/19/2017 02:57    ------  Assessment:  Patient is a 62 y.o. female presented with nausea and vomiting for 3 days, found in workup to be in acute renal failure, have a UTI with leukocytosis and  possible beginning signs of sepsis, and imaging demonstrating bilateral obstructing ureteral stones.   Given findings, will take patient emergently to OR for  cystoscopy, bilateral ureteral stent placement. We will attempt left ureteral stent placement due to the patient's infection, however it may prove difficult given the chronicity of her left UPJ stone. The left kidney does not provide substantial kidney function. Risks and benefits of surgery discussed with patient and son, including explicit risk of inability to pass a left sided stent and need for nephrostomy tube, and worsening of infection requiring ICU admission. Patient and son are in agreement to proceed.   Recommendations: 1. NPO 2. Send urine culture 3. Agree with ceftriaxone 4. To OR this AM for cystoscopy, bilateral ureteral stent placement   Thank you for this consult. Please contact the urology consult pager with any further questions/concerns.  Jonna Clark, MD Urology Surgical Resident  -----

## 2017-09-19 NOTE — ED Notes (Signed)
Pt prepped for the or. Belongings with son.

## 2017-09-19 NOTE — H&P (Signed)
[facility] History and Physical  Sheila Leach MVH:[id number] DOB: 11-21-1955 DOA: 09/19/2017  Referring physician:  PCP: Shon Baton, MD  Specialists:   Chief Complaint: nausea, vomiting, flank pains   HPI: Sheila Leach is a 62 y.o. female with PMH of DJD, HTN, Asthma, Hypothyroidism, CKD, Nephrolithiasis, presented with nausea, vomiting, diarrhea associated with flank pains for several days. She has developed worsening back pains with hematuria last night and presented to emergency department for further evaluation. No hematemesis, no hematochezia, no acute abdominal pains but had mild lower abdominal discomfort , no acute chest pains or shortness of breath. She had mild productive cough, no wheezing. He reports some chills no fevers. She reports taking nsaids for few days  -ED: CT abd showed hydronephrosis, creatinine 8.7, previously at 1.4. ED d/w urology who plans to take her to surgery today. hospitalist is called for admission   Review of Systems: The patient denies anorexia, fever, weight loss,, vision loss, decreased hearing, hoarseness, chest pain, syncope, dyspnea on exertion, peripheral edema, balance deficits, hemoptysis, abdominal pain, melena, hematochezia, severe indigestion/heartburn, hematuria, incontinence, genital sores, muscle weakness, suspicious skin lesions, transient blindness, difficulty walking, depression, unusual weight change, abnormal bleeding, enlarged lymph nodes, angioedema, and breast masses.    Past Medical History:  Diagnosis Date  . Arthritis    back  . Asthma   . Back injury   . Cancer ([place])    uterine  . Endometrial cancer ([place])    last radiation in 2010/no chemo  . Hx of radiation therapy 6/6/, 6/21, 6/24, 02/07/2009   brachytherapy  . Hyperlipidemia   . Hypertension   . Kidney stone    2016  did not pass  . Osteoporosis   . Personal history of colonic adenomas 01/24/2013  . Seasonal allergies   . Thyroid disease   . Weakness    left sided  weakness   Past Surgical History:  Procedure Laterality Date  . ABDOMINAL HYSTERECTOMY     Total  . DILATION AND CURETTAGE OF UTERUS     fibroids  . THYROIDECTOMY     childhood   Social History:  reports that  has never smoked. she has never used smokeless tobacco. She reports that she does not drink alcohol or use drugs. Home;  where does patient live--home, ALF, SNF? and with whom if at home? Yes;  Can patient participate in ADLs?  Allergies  Allergen Reactions  . Band-Aid Plus Antibiotic [Bacitracin-Polymyxin B]     Plastic band-aids cause redness  . Iodine Swelling    Face and lips     Family History  Problem Relation Age of Onset  . Heart disease Mother   . Diabetes Mother   . Kidney disease Mother   . Heart disease Father   . Thyroid cancer Sister   . Esophageal cancer Brother   . Diabetes Brother   . Liver disease Brother   . Colon cancer Neg Hx   . Rectal cancer Neg Hx   . Stomach cancer Neg Hx     (be sure to complete)  Prior to Admission medications   Medication Sig Start Date End Date Taking? Authorizing Provider  acetaminophen (TYLENOL) 325 MG tablet Take 650 mg by mouth every 6 (six) hours as needed for mild pain.    Yes [provider]  albuterol (PROVENTIL HFA;VENTOLIN HFA) 108 (90 BASE) MCG/ACT inhaler Inhale 2 puffs into the lungs every 6 (six) hours as needed for wheezing or shortness of breath.   Yes [provider]  Ascorbic Acid (VITAMIN C) 1000 MG tablet Take 1,000 mg by mouth daily.    Yes [provider]  cetirizine (ZYRTEC) 10 MG tablet Take 10 mg by mouth daily.   Yes [provider]  ergocalciferol (VITAMIN D2) 50000 UNITS capsule Take 50,000 Units by mouth every Sunday.    Yes [provider]  fenofibrate 160 MG tablet Take 160 mg by mouth daily. 09/15/17  Yes [provider]  ibuprofen (ADVIL,MOTRIN) 200 MG tablet Take 400 mg by mouth every 6 (six) hours as needed for headache, mild pain  or moderate pain.    Yes [provider]  levothyroxine (SYNTHROID, LEVOTHROID) 175 MCG tablet Take 175 mcg by mouth daily before breakfast.   Yes [provider]  lisinopril (PRINIVIL,ZESTRIL) 20 MG tablet Take 40 mg by mouth daily.    Yes [provider]  Multiple Vitamins-Minerals (MULTIVITAMIN WITH MINERALS) tablet Take 1 tablet by mouth daily.   Yes [provider]   Physical Exam: Vitals:   09/19/17 0614 09/19/17 0701  BP: 131/79 101/79  Pulse: (!) 124 (!) 125  Resp: (!) 27 (!) 25  Temp: 97.9 F (36.6 C)   SpO2: 94% 95%     General:  Alert. No distress   Eyes: eom-I, perrla   ENT: no oral ulcers   Neck: supple, no JVD  Cardiovascular: s1,s2 rrr  Respiratory: no wheezing   Abdomen: soft, nt, obese   Skin: no rash   Musculoskeletal: no pedal edema   Psychiatric: s1,s2 rrr  Neurologic: CN 2-12 intact. Motor 5/5 symmetric   Labs on Admission:  Basic Metabolic Panel: Recent Labs  Lab 09/18/17 2349  NA 133*  K 4.5  CL 101  CO2 14*  GLUCOSE 144*  BUN 113*  CREATININE 8.97*  CALCIUM 9.4   Liver Function Tests: Recent Labs  Lab 09/18/17 2349  AST 31  ALT 50  ALKPHOS 120  BILITOT 0.8  PROT 6.7  ALBUMIN 2.7*   Recent Labs  Lab 09/18/17 2349  LIPASE 21   No results for input(s): AMMONIA in the last 168 hours. CBC: Recent Labs  Lab 09/18/17 2349  WBC 14.9*  HGB 11.8*  HCT 34.6*  MCV 88.3  PLT 177   Cardiac Enzymes: No results for input(s): CKTOTAL, CKMB, CKMBINDEX, TROPONINI in the last 168 hours.  BNP (last 3 results) No results for input(s): BNP in the last 8760 hours.  ProBNP (last 3 results) No results for input(s): PROBNP in the last 8760 hours.  CBG: No results for input(s): GLUCAP in the last 168 hours.  Radiological Exams on Admission: Ct Abdomen Pelvis Wo Contrast  Result Date: 09/19/2017 CLINICAL DATA:  Abdominal pain and hematuria. EXAM: CT ABDOMEN AND PELVIS WITHOUT CONTRAST  TECHNIQUE: Multidetector CT imaging of the abdomen and pelvis was performed following the standard protocol without IV contrast. COMPARISON:  CT 09/25/2014 FINDINGS: Lower chest: Upper normal heart size. Bilateral lower lobe atelectasis, breathing motion artifact. Hepatobiliary: The liver is enlarged spanning 22.5 cm with diffuse steatosis. No evidence of focal lesion allowing for lack contrast. Possible sludge in the gallbladder which is physiologically distended. No calcified stone. No biliary dilatation. Pancreas: No ductal dilatation or inflammation. Spleen: Enlarged measuring 16.3 cm AP. Adrenals/Urinary Tract: No adrenal nodule. Obstructing 7 mm stone in the distal right ureter just proximal to the ureterovesicular junction with mild right hydroureteronephrosis and perinephric edema. Two additional nonobstructing stones in the upper right kidney. Probable cyst in the mid right kidney. Chronic 15  mm stone at the left ureteropelvic junction with chronic severe hydronephrosis. Progressive parenchymal thinning from prior exam. No left perinephric edema. Nonobstructing stone versus parenchymal calcification in the mid left kidney. Urinary bladder is completely decompressed. Stomach/Bowel: Multifocal colonic diverticulosis without diverticulitis. Normal appendix. No bowel obstruction, inflammation or wall thickening. Vascular/Lymphatic: Aortic atherosclerosis. Retroaortic left renal vein. Increased number of multiple small retroperitoneal lymph nodes. Largest node measures 11 mm short axis in the pericaval region at the level of the right kidney. Small periportal nodes are likely reactive. Reproductive: Status post hysterectomy. No adnexal masses. Other: No free air, free fluid, or intra-abdominal fluid collection. Musculoskeletal: Multilevel degenerative change throughout the lumbar spine. There are no acute or suspicious osseous abnormalities. IMPRESSION: 1. Obstructing 7 mm stone in the distal right ureter just  proximal to the ureteropelvic junction with mild hydroureteronephrosis and perinephric edema. 2. 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 3. Additional nonobstructing right renal calculi. 4. Hepatosplenomegaly and hepatic steatosis. 5. Colonic diverticulosis without diverticulitis. 6.  Aortic Atherosclerosis (ICD10-I70.0). Electronically Signed   By: Jeb Levering M.D.   On: 09/19/2017 05:42   Dg Chest 2 View  Result Date: 09/19/2017 CLINICAL DATA:  Nausea. EXAM: CHEST  2 VIEW COMPARISON:  06/24/2014 FINDINGS: Very low lung volumes on the AP view limit assessment. The heart is enlarged. Bronchovascular crowding with peribronchial cuffing. No confluent airspace disease. No large pleural effusion. No pneumothorax. Body habitus limits detailed assessment. Chronic change about the left shoulder. IMPRESSION: Cardiomegaly. Peribronchial cuffing favored to be bronchial inflammation over pulmonary edema. Electronically Signed   By: Jeb Levering M.D.   On: 09/19/2017 02:57    EKG: Independently reviewed.   Assessment/Plan Active Problems:   AKI (acute kidney injury) ([place])   HTN (hypertension)   Hydronephrosis with renal and ureteral calculus obstruction   Hematuria   Acute lower UTI   62 y.o. female with PMH of DJD, HTN, Asthma, Hypothyroidism, CKD II, Nephrolithiasis, presented with nausea, vomiting, diarrhea associated with flank pains, hematuria   Nephrolithiasis. Hydronephrosis. CT abd: Obstructing 7 mm stone in the distal right ureter just proximal to the ureteropelvic junction with mild hydroureteronephrosis and perinephric edema. 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 -urology plans stenting today. Will cont NPO, pain control. Monitor urine output   AKI. AG acidosis. Likely due to  obstructive uropathy +UTI+nsaids+lisinopril. Received iv fluids in ED. Cont as above. Cont iv fluids, monitor urine output, I/o, labs, hold nephrotoxic agents, treat the infection   UTI, pyelonephritis. Lactic acid-0.6. BP is stable. Cont iv antibiotic treatment, pend cultures.   Asthma. Chronic . No s/s of exacerbation. Cont prn bronchodilators   Hypothyroidism, cont levothyroxine, check TSH  HTN. BP is soft,. Hold lisinopril   Nausea, vomiting diarrhea in the setting of UTI, renal stones. abd exam is unremarkable. Cont as above. Monitor   Urology.  if consultant consulted, please document name and whether formally or informally consulted  Code Status: full (must indicate code status--if unknown or must be presumed, indicate so) Family Communication: d/w patient, her family.  (indicate person spoken with, if applicable, with phone number if by telephone) Disposition Plan: home when stable  (indicate anticipated LOS)  Time spent: >45 minutes   Kinnie Feil [facility] Pager [phone]  If 7PM-7AM, please contact night-coverage [email or web address] Password [facility] 09/19/2017, 7:41  AM

## 2017-09-20 ENCOUNTER — Inpatient Hospital Stay (HOSPITAL_COMMUNITY): Payer: Medicare Other

## 2017-09-20 ENCOUNTER — Encounter (HOSPITAL_COMMUNITY): Payer: Self-pay | Admitting: Urology

## 2017-09-20 DIAGNOSIS — N131 Hydronephrosis with ureteral stricture, not elsewhere classified: Secondary | ICD-10-CM

## 2017-09-20 DIAGNOSIS — N020 Recurrent and persistent hematuria with minor glomerular abnormality: Secondary | ICD-10-CM

## 2017-09-20 HISTORY — PX: IR NEPHROSTOMY PLACEMENT LEFT: IMG6063

## 2017-09-20 LAB — CBC
HCT: 33.4 % — ABNORMAL LOW (ref 36.0–46.0)
Hemoglobin: 10.7 g/dL — ABNORMAL LOW (ref 12.0–15.0)
MCH: 29.6 pg (ref 26.0–34.0)
MCHC: 32 g/dL (ref 30.0–36.0)
MCV: 92.3 fL (ref 78.0–100.0)
PLATELETS: 166 10*3/uL (ref 150–400)
RBC: 3.62 MIL/uL — ABNORMAL LOW (ref 3.87–5.11)
RDW: 15.1 % (ref 11.5–15.5)
WBC: 9.6 10*3/uL (ref 4.0–10.5)

## 2017-09-20 LAB — BASIC METABOLIC PANEL
Anion gap: 18 — ABNORMAL HIGH (ref 5–15)
BUN: 119 mg/dL — AB (ref 6–20)
CHLORIDE: 107 mmol/L (ref 101–111)
CO2: 14 mmol/L — ABNORMAL LOW (ref 22–32)
CREATININE: 8.65 mg/dL — AB (ref 0.44–1.00)
Calcium: 8.3 mg/dL — ABNORMAL LOW (ref 8.9–10.3)
GFR calc Af Amer: 5 mL/min — ABNORMAL LOW (ref >60)
GFR calc non Af Amer: 4 mL/min — ABNORMAL LOW (ref >60)
GLUCOSE: 216 mg/dL — AB (ref 65–99)
POTASSIUM: 5.1 mmol/L (ref 3.5–5.1)
Sodium: 139 mmol/L (ref 135–145)

## 2017-09-20 LAB — T4, FREE: Free T4: 0.65 ng/dL (ref 0.61–1.12)

## 2017-09-20 LAB — HIV ANTIBODY (ROUTINE TESTING W REFLEX): HIV Screen 4th Generation wRfx: NONREACTIVE

## 2017-09-20 MED ORDER — HEPARIN SODIUM (PORCINE) 5000 UNIT/ML IJ SOLN
5000.0000 [IU] | Freq: Three times a day (TID) | INTRAMUSCULAR | Status: DC
  Administered 2017-09-20 – 2017-09-22 (×8): 5000 [IU] via SUBCUTANEOUS
  Filled 2017-09-20 (×8): qty 1

## 2017-09-20 MED ORDER — MIDAZOLAM HCL 2 MG/2ML IJ SOLN
INTRAMUSCULAR | Status: AC | PRN
  Administered 2017-09-20: 1 mg via INTRAVENOUS

## 2017-09-20 MED ORDER — IOPAMIDOL (ISOVUE-300) INJECTION 61%
INTRAVENOUS | Status: AC
  Filled 2017-09-20: qty 50

## 2017-09-20 MED ORDER — SODIUM BICARBONATE 8.4 % IV SOLN
INTRAVENOUS | Status: DC
  Administered 2017-09-20 – 2017-09-21 (×2): via INTRAVENOUS
  Filled 2017-09-20 (×2): qty 100

## 2017-09-20 MED ORDER — CEFAZOLIN SODIUM-DEXTROSE 2-4 GM/100ML-% IV SOLN
INTRAVENOUS | Status: AC
  Administered 2017-09-20: 2 g via INTRAVENOUS
  Filled 2017-09-20: qty 100

## 2017-09-20 MED ORDER — FUROSEMIDE 10 MG/ML IJ SOLN
20.0000 mg | Freq: Once | INTRAMUSCULAR | Status: AC
  Administered 2017-09-20: 20 mg via INTRAVENOUS
  Filled 2017-09-20: qty 2

## 2017-09-20 MED ORDER — LIP MEDEX EX OINT
TOPICAL_OINTMENT | CUTANEOUS | Status: AC
  Administered 2017-09-20: 15:00:00
  Filled 2017-09-20: qty 7

## 2017-09-20 MED ORDER — FENTANYL CITRATE (PF) 100 MCG/2ML IJ SOLN
INTRAMUSCULAR | Status: AC
  Filled 2017-09-20: qty 2

## 2017-09-20 MED ORDER — FENTANYL CITRATE (PF) 100 MCG/2ML IJ SOLN
INTRAMUSCULAR | Status: AC | PRN
  Administered 2017-09-20: 50 ug via INTRAVENOUS

## 2017-09-20 MED ORDER — SODIUM POLYSTYRENE SULFONATE PO POWD
30.0000 g | Freq: Once | ORAL | Status: AC
  Administered 2017-09-20: 30 g via ORAL
  Filled 2017-09-20: qty 30

## 2017-09-20 MED ORDER — LIDOCAINE HCL (PF) 1 % IJ SOLN
INTRAMUSCULAR | Status: AC
  Filled 2017-09-20: qty 30

## 2017-09-20 MED ORDER — MIDAZOLAM HCL 2 MG/2ML IJ SOLN
INTRAMUSCULAR | Status: AC
  Filled 2017-09-20: qty 4

## 2017-09-20 MED ORDER — LIDOCAINE HCL 1 % IJ SOLN
INTRAMUSCULAR | Status: AC | PRN
  Administered 2017-09-20: 10 mL

## 2017-09-20 MED ORDER — IOPAMIDOL (ISOVUE-300) INJECTION 61%: 50.0000 mL | Freq: Once | INTRAVENOUS | Status: DC | PRN

## 2017-09-20 MED ORDER — CEFAZOLIN SODIUM-DEXTROSE 2-4 GM/100ML-% IV SOLN
2.0000 g | Freq: Once | INTRAVENOUS | Status: AC
  Administered 2017-09-20: 2 g via INTRAVENOUS
  Filled 2017-09-20: qty 100

## 2017-09-20 NOTE — Progress Notes (Signed)
@IPLOG @        PROGRESS NOTE                                                                                                                                                                                                             Patient Demographics:    Sheila Leach, is a 62 y.o. female, DOB - Mar 03, 1956, WUG:[id number]  Admit date - 09/19/2017   Admitting Physician Norval Morton, MD  Outpatient Primary MD for the patient is Shon Baton, MD  LOS - 1  Chief Complaint  Patient presents with  . Hematuria  . Emesis       Brief Narrative  Sheila Leach is a 62 y.o. female with PMH of DJD, HTN, Asthma, Hypothyroidism, CKD, Nephrolithiasis, presented with nausea, vomiting, diarrhea associated with flank pains for several days. She has developed worsening back pains with hematuria last night and presented to emergency department for further evaluation. No hematemesis, no hematochezia, no acute abdominal pains but had mild lower abdominal discomfort , no acute chest pains or shortness of breath. She had mild productive cough, no wheezing. He reports some chills no fevers. She reports taking nsaids for few days   ED: CT abd showed hydronephrosis, creatinine 8.7, previously at 1.4. ED d/w urology who plans to take her to surgery today. hospitalist is called for admission     Subjective:    Sheila Leach today has, No headache, No chest pain, No abdominal pain - No Nausea, No new weakness tingling or numbness, No Cough - SOB.    Assessment  & Plan :     Bilateral ureteric obstruction with hydronephrosis and acute renal failure.  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  She underwent cystoscopy with right-sided double-J stent placement on 09/19/2017, she is due for left-sided percutaneous nephrostomy tube placement by IR on 09/20/2017.  Possible early  pyelonephritis due to #1 above.  Continue Rocephin follow cultures.  ARF. AG acidosis.  Due to obstructive uropathy as above, hold nephrotoxins, hydrate, monitor BMP, dose of Kayexalate and Lasix given on 09/20/2017 due to rising potassium levels.  Will give IV bicarb for 24 hours to help with hyperkalemia as well.  Asthma. Chronic . No s/s of exacerbation. Cont prn bronchodilators   Hypothyroidism, cont levothyroxine, check TSH  HTN. BP is soft,. Hold lisinopril   Nausea, vomiting diarrhea in the setting of UTI, renal stones. abd exam is unremarkable.  Improved with supportive care.    Diet : Diet renal with fluid restriction Fluid restriction: 1200 mL Fluid; Room service appropriate? Yes; Fluid consistency: Thin  Family Communication  : None present  Code Status : Full  Disposition Plan  : To be decided  Consults  : Urology, IR  Procedures  :    CT -  IMPRESSION: 1. Obstructing 7 mm stone in the distal right ureter just proximal to the ureteropelvic junction with mild hydroureteronephrosis and perinephric edema. 2. 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 3. Additional nonobstructing right renal calculi. 4. Hepatosplenomegaly and hepatic steatosis. 5. Colonic diverticulosis without diverticulitis. 6.  Aortic Atherosclerosis     DVT Prophylaxis  :   Heparin   Lab Results  Component Value Date   PLT 166 09/20/2017    Inpatient Medications  Scheduled Meds: . iopamidol      . lidocaine (PF)      . levothyroxine  175 mcg Oral QAC breakfast  . loratadine  10 mg Oral Daily  . vitamin C  1,000 mg Oral Daily  . Vitamin D (Ergocalciferol)  50,000 Units Oral Q Sun   Continuous Infusions: . sodium chloride 125 mL/hr at 09/20/17 0515  . cefTRIAXone (ROCEPHIN)  IV Stopped (09/20/17 0110)   PRN Meds:.acetaminophen **OR** acetaminophen, HYDROcodone-acetaminophen,  ipratropium-albuterol, ondansetron  Antibiotics  :    Anti-infectives (From admission, onward)   Start     Dose/Rate Route Frequency Ordered Stop   09/20/17 0300  cefTRIAXone (ROCEPHIN) 1 g in sodium chloride 0.9 % 100 mL IVPB  Status:  Discontinued     1 g 200 mL/hr over 30 Minutes Intravenous Every 24 hours 09/19/17 0813 09/19/17 2311   09/19/17 2315  cefTRIAXone (ROCEPHIN) 2 g in sodium chloride 0.9 % 100 mL IVPB     2 g 200 mL/hr over 30 Minutes Intravenous Daily at bedtime 09/19/17 2311     09/19/17 2200  cefTRIAXone (ROCEPHIN) 2 g in sodium chloride 0.9 % 100 mL IVPB  Status:  Discontinued     2 g 200 mL/hr over 30 Minutes Intravenous Every 24 hours 09/19/17 0151 09/19/17 0813   09/19/17 0130  cefTRIAXone (ROCEPHIN) 2 g in sodium chloride 0.9 % 100 mL IVPB     2 g 200 mL/hr over 30 Minutes Intravenous  Once 09/19/17 0129 09/19/17 0344         Objective:   Vitals:   09/19/17 1509 09/19/17 2041 09/20/17 0515 09/20/17 0735  BP:  102/82 121/80   Pulse:  (!) 105 (!) 103   Resp:  18 18 (!) 24  Temp:  98.2 F (36.8 C) 97.8 F (36.6 C)   TempSrc:  Oral Oral   SpO2: 96% 98% 93% 92%  Weight:      Height:        Wt Readings from Last 3 Encounters:  09/18/17 127.9 kg (282 lb)  02/11/17 127 kg (280 lb)  03/24/16 127.9 kg (282 lb)     Intake/Output Summary (Last 24 hours) at 09/20/2017 1201 Last data filed at 09/20/2017 0515 Gross per 24 hour  Intake 1780 ml  Output 990 ml  Net 790 ml     Physical Exam  Awake Alert, Oriented X 3, No new F.N deficits, Normal affect [place].AT,PERRAL Supple Neck,No JVD, No cervical lymphadenopathy appriciated.  Symmetrical Chest wall movement, Good air movement bilaterally, CTAB RRR,No Gallops,Rubs or new Murmurs, No Parasternal  Heave +ve B.Sounds, Abd Soft, No tenderness, No organomegaly appriciated, No rebound - guarding or rigidity. No Cyanosis, Clubbing or edema, No new Rash or bruise     Data Review:    CBC Recent Labs  Lab  09/18/17 2349 09/20/17 0555  WBC 14.9* 9.6  HGB 11.8* 10.7*  HCT 34.6* 33.4*  PLT 177 166  MCV 88.3 92.3  MCH 30.1 29.6  MCHC 34.1 32.0  RDW 14.4 15.1    Chemistries  Recent Labs  Lab 09/18/17 2349 09/20/17 0555  NA 133* 139  K 4.5 5.1  CL 101 107  CO2 14* 14*  GLUCOSE 144* 216*  BUN 113* 119*  CREATININE 8.97* 8.65*  CALCIUM 9.4 8.3*  AST 31  --   ALT 50  --   ALKPHOS 120  --   BILITOT 0.8  --    ------------------------------------------------------------------------------------------------------------------ No results for input(s): CHOL, HDL, LDLCALC, TRIG, CHOLHDL, LDLDIRECT in the last 72 hours.  No results found for: HGBA1C ------------------------------------------------------------------------------------------------------------------ Recent Labs    09/19/17 1316  TSH 13.315*   ------------------------------------------------------------------------------------------------------------------ No results for input(s): VITAMINB12, FOLATE, FERRITIN, TIBC, IRON, RETICCTPCT in the last 72 hours.  Coagulation profile Recent Labs  Lab 09/19/17 1316  INR 1.08    No results for input(s): DDIMER in the last 72 hours.  Cardiac Enzymes No results for input(s): CKMB, TROPONINI, MYOGLOBIN in the last 168 hours.  Invalid input(s): CK ------------------------------------------------------------------------------------------------------------------    Component Value Date/Time   BNP 251.6 (H) 09/19/2017 1316    Micro Results Recent Results (from the past 240 hour(s))  Culture, Urine     Status: Abnormal (Preliminary result)   Collection Time: 09/18/17 11:30 PM  Result Value Ref Range Status   Specimen Description   Final    URINE, CLEAN CATCH Performed at [facility] [address], [place], [place] [address]    Special Requests   Final    catheterize if needed Performed at [facility], [place] [address]  [place], [place] [address]    Culture >=100,000 COLONIES/mL GRAM NEGATIVE RODS (A)  Final   Report Status PENDING  Incomplete  Blood Culture (routine x 2)     Status: None (Preliminary result)   Collection Time: 09/19/17  1:35 AM  Result Value Ref Range Status   Specimen Description   Final    BLOOD LEFT ANTECUBITAL Performed at [facility] [address], [place], [place] [address]    Special Requests   Final    BOTTLES DRAWN AEROBIC AND ANAEROBIC Blood Culture adequate volume Performed at [facility] [address], [place], [place] [address]    Culture  Setup Time   Final    GRAM NEGATIVE RODS IN BOTH AEROBIC AND ANAEROBIC BOTTLES Organism ID to follow CRITICAL RESULT CALLED TO, READ BACK BY AND VERIFIED WITH: Lavell Luster [facility] 0998 09/19/17 HMILES Performed at [facility], [place] [address] [place], [place] [address]    Culture GRAM NEGATIVE RODS  Final   Report Status PENDING  Incomplete  Blood Culture ID Panel (Reflexed)     Status: Abnormal   Collection Time: 09/19/17  1:35 AM  Result Value Ref Range Status   Enterococcus species NOT DETECTED NOT DETECTED Final   Listeria monocytogenes NOT DETECTED NOT DETECTED Final   Staphylococcus species NOT DETECTED NOT DETECTED Final   Staphylococcus aureus NOT DETECTED NOT DETECTED Final   Streptococcus species NOT DETECTED NOT DETECTED Final   Streptococcus agalactiae NOT  DETECTED NOT DETECTED Final   Streptococcus pneumoniae NOT DETECTED NOT DETECTED Final   Streptococcus pyogenes NOT DETECTED NOT DETECTED Final   Acinetobacter baumannii NOT DETECTED NOT DETECTED Final   Enterobacteriaceae species DETECTED (A) NOT DETECTED Final    Comment: Enterobacteriaceae represent a large family of gram-negative bacteria, not a single organism. CRITICAL RESULT CALLED TO, READ BACK BY AND VERIFIED WITH: Lavell Luster PHARMD 2309 09/19/17 HMILES    Enterobacter cloacae complex NOT DETECTED NOT DETECTED  Final   Escherichia coli NOT DETECTED NOT DETECTED Final   Klebsiella oxytoca NOT DETECTED NOT DETECTED Final   Klebsiella pneumoniae DETECTED (A) NOT DETECTED Final    Comment: CRITICAL RESULT CALLED TO, READ BACK BY AND VERIFIED WITH: J.GRIMSLEY PHARMD 2309 09/19/17 HMILES    Proteus species NOT DETECTED NOT DETECTED Final   Serratia marcescens NOT DETECTED NOT DETECTED Final   Carbapenem resistance NOT DETECTED NOT DETECTED Final   Haemophilus influenzae NOT DETECTED NOT DETECTED Final   Neisseria meningitidis NOT DETECTED NOT DETECTED Final   Pseudomonas aeruginosa NOT DETECTED NOT DETECTED Final   Candida albicans NOT DETECTED NOT DETECTED Final   Candida glabrata NOT DETECTED NOT DETECTED Final   Candida krusei NOT DETECTED NOT DETECTED Final   Candida parapsilosis NOT DETECTED NOT DETECTED Final   Candida tropicalis NOT DETECTED NOT DETECTED Final    Comment: Performed at [facility], [place]. [address], [place], [place] [address]    Radiology Reports Ct Abdomen Pelvis Wo Contrast  Result Date: 09/19/2017 CLINICAL DATA:  Abdominal pain and hematuria. EXAM: CT ABDOMEN AND PELVIS WITHOUT CONTRAST TECHNIQUE: Multidetector CT imaging of the abdomen and pelvis was performed following the standard protocol without IV contrast. COMPARISON:  CT 09/25/2014 FINDINGS: Lower chest: Upper normal heart size. Bilateral lower lobe atelectasis, breathing motion artifact. Hepatobiliary: The liver is enlarged spanning 22.5 cm with diffuse steatosis. No evidence of focal lesion allowing for lack contrast. Possible sludge in the gallbladder which is physiologically distended. No calcified stone. No biliary dilatation. Pancreas: No ductal dilatation or inflammation. Spleen: Enlarged measuring 16.3 cm AP. Adrenals/Urinary Tract: No adrenal nodule. Obstructing 7 mm stone in the distal right ureter just proximal to the ureterovesicular junction with mild right hydroureteronephrosis and perinephric edema. Two  additional nonobstructing stones in the upper right kidney. Probable cyst in the mid right kidney. Chronic 15 mm stone at the left ureteropelvic junction with chronic severe hydronephrosis. Progressive parenchymal thinning from prior exam. No left perinephric edema. Nonobstructing stone versus parenchymal calcification in the mid left kidney. Urinary bladder is completely decompressed. Stomach/Bowel: Multifocal colonic diverticulosis without diverticulitis. Normal appendix. No bowel obstruction, inflammation or wall thickening. Vascular/Lymphatic: Aortic atherosclerosis. Retroaortic left renal vein. Increased number of multiple small retroperitoneal lymph nodes. Largest node measures 11 mm short axis in the pericaval region at the level of the right kidney. Small periportal nodes are likely reactive. Reproductive: Status post hysterectomy. No adnexal masses. Other: No free air, free fluid, or intra-abdominal fluid collection. Musculoskeletal: Multilevel degenerative change throughout the lumbar spine. There are no acute or suspicious osseous abnormalities. IMPRESSION: 1. Obstructing 7 mm stone in the distal right ureter just proximal to the ureteropelvic junction with mild hydroureteronephrosis and perinephric edema. 2. 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 3. Additional nonobstructing right renal calculi. 4. Hepatosplenomegaly and hepatic steatosis. 5. Colonic diverticulosis without diverticulitis. 6.  Aortic Atherosclerosis (ICD10-I70.0). Electronically Signed   By:  Jeb Levering M.D.   On: 09/19/2017 05:42   Dg Chest 2 View  Result Date: 09/19/2017 CLINICAL DATA:  Nausea. EXAM: CHEST  2 VIEW COMPARISON:  06/24/2014 FINDINGS: Very low lung volumes on the AP view limit assessment. The heart is enlarged. Bronchovascular crowding with peribronchial cuffing. No confluent airspace disease. No large pleural  effusion. No pneumothorax. Body habitus limits detailed assessment. Chronic change about the left shoulder. IMPRESSION: Cardiomegaly. Peribronchial cuffing favored to be bronchial inflammation over pulmonary edema. Electronically Signed   By: Jeb Levering M.D.   On: 09/19/2017 02:57   Dg C-arm 1-60 Min-no Report  Result Date: 09/19/2017 Fluoroscopy was utilized by the requesting physician.  No radiographic interpretation.    Time Spent in minutes  30   Lala Lund M.D on 09/20/2017 at 12:01 PM  Between 7am to 7pm - Pager - [phone] ( page via [email or web address], text pages only, please mention full 10 digit call back number). After 7pm go to [email or web address] - password [facility]

## 2017-09-20 NOTE — Procedures (Signed)
Interventional Radiology Procedure Note  Procedure: Left 50F PCN placed  Complications: None  Estimated Blood Loss: None  Recommendations: - Tube to bag drainage - Return to IR in 6 wks for tube check/change  Signed,  Criselda Peaches, MD

## 2017-09-20 NOTE — Consult Note (Signed)
Chief Complaint: Patient was seen in consultation today for left percutaneous nephrostomy Chief Complaint  Patient presents with  . Hematuria  . Emesis     Referring Physician(s): [id number],F  Supervising Physician: Jacqulynn Cadet  Patient Status: [facility] - In-pt  History of Present Illness: Sheila Leach is a 62 y.o. female with history of hypertension, endometrial cancer with prior radiation therapy 2010 and nephrolithiasis who was recently admitted to [facility] with nausea and vomiting and subsequent labwork which revealed leukocytosis, UTI, elevated creatinine.  She has a known history of proximal left ureteral obstructing stone causing severe atrophy in the left kidney and prior renogram showing 8 % function.  She underwent cystoscopy yesterday with placement of right double-J stent.  They were unable to place left JJ stent.  There was left proximal ureteral extravasation of contrast noted following attempt.  Cystoscopy revealed a right distal ureteral calculus and left proximal ureteral calculus with no mass/lesions in bladder and ureteral orifices in normal anatomic location. Request now received for left PCN. She is currently afebrile with WBC 9.6, hemoglobin 10.7, platelets 166k, creatinine 8.65, glucose 10.9, INR 1.08.  Blood cultures with Enterobacter and Klebsiella.  Past Medical History:  Diagnosis Date  . Arthritis    back  . Asthma   . Back injury   . Cancer ([place])    uterine  . Endometrial cancer ([place])    last radiation in 2010/no chemo  . Hx of radiation therapy 6/6/, 6/21, 6/24, 02/07/2009   brachytherapy  . Hyperlipidemia   . Hypertension   . Kidney stone    2016  did not pass  . Osteoporosis   . Personal history of colonic adenomas 01/24/2013  . Seasonal allergies   . Thyroid disease   . Weakness    left sided weakness    Past Surgical History:  Procedure Laterality Date  . ABDOMINAL HYSTERECTOMY     Total  . CYSTOSCOPY W/ URETERAL STENT PLACEMENT Bilateral  09/19/2017   Procedure: CYSTOSCOPY WITH BILATERAL RETROGRADE PYELOGRAM/RIGHT URETERAL STENT PLACEMENT;  Surgeon: Cleon Gustin, MD;  Location: [facility];  Service: Urology;  Laterality: Bilateral;  . DILATION AND CURETTAGE OF UTERUS     fibroids  . THYROIDECTOMY     childhood    Allergies: Band-aid plus antibiotic [bacitracin-polymyxin b] and Iodine  Medications: Prior to Admission medications   Medication Sig Start Date End Date Taking? Authorizing Provider  acetaminophen (TYLENOL) 325 MG tablet Take 650 mg by mouth every 6 (six) hours as needed for mild pain.    Yes [provider]  albuterol (PROVENTIL HFA;VENTOLIN HFA) 108 (90 BASE) MCG/ACT inhaler Inhale 2 puffs into the lungs every 6 (six) hours as needed for wheezing or shortness of breath.   Yes [provider]  Ascorbic Acid (VITAMIN C) 1000 MG tablet Take 1,000 mg by mouth daily.    Yes [provider]  cetirizine (ZYRTEC) 10 MG tablet Take 10 mg by mouth daily.   Yes [provider]  ergocalciferol (VITAMIN D2) 50000 UNITS capsule Take 50,000 Units by mouth every Sunday.    Yes [provider]  fenofibrate 160 MG tablet Take 160 mg by mouth daily. 09/15/17  Yes [provider]  ibuprofen (ADVIL,MOTRIN) 200 MG tablet Take 400 mg by mouth every 6 (six) hours as needed for headache, mild pain or moderate pain.    Yes [provider]  levothyroxine (SYNTHROID, LEVOTHROID) 175 MCG tablet Take 175 mcg by mouth daily before breakfast.   Yes [provider]  lisinopril (PRINIVIL,ZESTRIL) 20 MG tablet Take 40 mg by mouth daily.    Yes [provider]  Multiple Vitamins-Minerals (MULTIVITAMIN WITH MINERALS) tablet Take 1 tablet by mouth daily.   Yes [provider]     Family History  Problem Relation Age of Onset  . Heart disease Mother   . Diabetes Mother   . Kidney disease Mother   . Heart disease Father   . Thyroid cancer Sister   .  Esophageal cancer Brother   . Diabetes Brother   . Liver disease Brother   . Colon cancer Neg Hx   . Rectal cancer Neg Hx   . Stomach cancer Neg Hx     Social History   Socioeconomic History  . Marital status: Divorced    Spouse name: None  . Number of children: None  . Years of education: None  . Highest education level: None  Social Needs  . Financial resource strain: None  . Food insecurity - worry: None  . Food insecurity - inability: None  . Transportation needs - medical: None  . Transportation needs - non-medical: None  Occupational History  . None  Tobacco Use  . Smoking status: Never Smoker  . Smokeless tobacco: Never Used  Substance and Sexual Activity  . Alcohol use: No  . Drug use: No  . Sexual activity: None  Other Topics Concern  . None  Social History Narrative  . None      Review of Systems see above; currently denies fever, HA,CP,back pain,N/V or bleeding; does have some dyspnea with exertion, occ cough and mild abd discomfort  Vital Signs: BP 121/80 (BP Location: Left Arm)   Pulse (!) 103   Temp 97.8 F (36.6 C) (Oral)   Resp (!) 24   Ht 5\' 8"  (1.727 m)   Wt 282 lb (127.9 kg)   SpO2 92%   BMI 42.88 kg/m   Physical Exam awake/alert; chest- CTA bilat; heart- sl tachy, reg rhythm; abd- obese, soft, +BS, mild gen tenderness to palpation; no sig LE edema  Imaging: Ct Abdomen Pelvis Wo Contrast  Result Date: 09/19/2017 CLINICAL DATA:  Abdominal pain and hematuria. EXAM: CT ABDOMEN AND PELVIS WITHOUT CONTRAST TECHNIQUE: Multidetector CT imaging of the abdomen and pelvis was performed following the standard protocol without IV contrast. COMPARISON:  CT 09/25/2014 FINDINGS: Lower chest: Upper normal heart size. Bilateral lower lobe atelectasis, breathing motion artifact. Hepatobiliary: The liver is enlarged spanning 22.5 cm with diffuse steatosis. No evidence of focal lesion allowing for lack contrast. Possible sludge in the gallbladder which is  physiologically distended. No calcified stone. No biliary dilatation. Pancreas: No ductal dilatation or inflammation. Spleen: Enlarged measuring 16.3 cm AP. Adrenals/Urinary Tract: No adrenal nodule. Obstructing 7 mm stone in the distal right ureter just proximal to the ureterovesicular junction with mild right hydroureteronephrosis and perinephric edema. Two additional nonobstructing stones in the upper right kidney. Probable cyst in the mid right kidney. Chronic 15 mm stone at the left ureteropelvic junction with chronic severe hydronephrosis. Progressive parenchymal thinning from prior exam. No left perinephric edema. Nonobstructing stone versus parenchymal calcification in the mid left kidney. Urinary bladder is completely decompressed. Stomach/Bowel: Multifocal colonic diverticulosis without diverticulitis. Normal appendix. No bowel obstruction, inflammation or wall thickening. Vascular/Lymphatic: Aortic atherosclerosis. Retroaortic left renal vein. Increased number of multiple small retroperitoneal lymph nodes. Largest node measures 11 mm short axis in the pericaval region at the level of the right kidney. Small periportal nodes are likely reactive. Reproductive:  Status post hysterectomy. No adnexal masses. Other: No free air, free fluid, or intra-abdominal fluid collection. Musculoskeletal: Multilevel degenerative change throughout the lumbar spine. There are no acute or suspicious osseous abnormalities. IMPRESSION: 1. Obstructing 7 mm stone in the distal right ureter just proximal to the ureteropelvic junction with mild hydroureteronephrosis and perinephric edema. 2. 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 3. Additional nonobstructing right renal calculi. 4. Hepatosplenomegaly and hepatic steatosis. 5. Colonic diverticulosis without diverticulitis. 6.  Aortic Atherosclerosis (ICD10-I70.0). Electronically  Signed   By: Jeb Levering M.D.   On: 09/19/2017 05:42   Dg Chest 2 View  Result Date: 09/19/2017 CLINICAL DATA:  Nausea. EXAM: CHEST  2 VIEW COMPARISON:  06/24/2014 FINDINGS: Very low lung volumes on the AP view limit assessment. The heart is enlarged. Bronchovascular crowding with peribronchial cuffing. No confluent airspace disease. No large pleural effusion. No pneumothorax. Body habitus limits detailed assessment. Chronic change about the left shoulder. IMPRESSION: Cardiomegaly. Peribronchial cuffing favored to be bronchial inflammation over pulmonary edema. Electronically Signed   By: Jeb Levering M.D.   On: 09/19/2017 02:57   Dg C-arm 1-60 Min-no Report  Result Date: 09/19/2017 Fluoroscopy was utilized by the requesting physician.  No radiographic interpretation.    Labs:  CBC: Recent Labs    09/18/17 2349 09/20/17 0555  WBC 14.9* 9.6  HGB 11.8* 10.7*  HCT 34.6* 33.4*  PLT 177 166    COAGS: Recent Labs    09/19/17 1316  INR 1.08    BMP: Recent Labs    09/18/17 2349 09/20/17 0555  NA 133* 139  K 4.5 5.1  CL 101 107  CO2 14* 14*  GLUCOSE 144* 216*  BUN 113* 119*  CALCIUM 9.4 8.3*  CREATININE 8.97* 8.65*  GFRNONAA 4* 4*  GFRAA 5* 5*    LIVER FUNCTION TESTS: Recent Labs    09/18/17 2349  BILITOT 0.8  AST 31  ALT 50  ALKPHOS 120  PROT 6.7  ALBUMIN 2.7*    TUMOR MARKERS: No results for input(s): AFPTM, CEA, CA199, CHROMGRNA in the last 8760 hours.  Assessment and Plan: 62 y.o. female with history of hypertension, endometrial cancer with prior radiation therapy 2010 and nephrolithiasis who was recently admitted to [facility] with nausea and vomiting and subsequent labwork which revealed leukocytosis, UTI, elevated creatinine.  She has a known history of proximal left ureteral obstructing stone causing severe atrophy in the left kidney and prior renogram showing 8 % function.  She underwent cystoscopy yesterday with placement of right double-J stent.   They were unable to place left JJ stent.  There was left proximal ureteral extravasation of contrast noted following attempt.  Cystoscopy revealed a right distal ureteral calculus and left proximal ureteral calculus with no mass/lesions in bladder and ureteral orifices in normal anatomic location. Request now received for left PCN. She is currently afebrile with WBC 9.6, hemoglobin 10.7, platelets 166k, creatinine 8.65, glucose 10.9, INR 1.08.  Blood cultures with Enterobacter and Klebsiella. Imaging studies were reviewed by Dr. Laurence Ferrari.Risks and benefits of procedure were discussed with the patient including, but not limited to, infection, bleeding, significant bleeding causing loss or decrease in renal function or damage to adjacent structures.   All of the patient's questions were answered, patient is agreeable to proceed.  Consent signed and in chart.  Procedure tent scheduled for today    Thank you for this interesting consult.  I  greatly enjoyed meeting Sheila Leach and look forward to participating in their care.  A copy of this report was sent to the requesting provider on this date.  Electronically Signed: D. Rowe Robert, PA-C 09/20/2017, 10:48 AM   I spent a total of 25 minutes    in face to face in clinical consultation, greater than 50% of which was counseling/coordinating care for left percutaneous nephrostomy

## 2017-09-20 NOTE — Progress Notes (Signed)
PHARMACY - PHYSICIAN COMMUNICATION CRITICAL VALUE ALERT - BLOOD CULTURE IDENTIFICATION (BCID)  Sheila Leach is an 62 y.o. female who presented to [facility] on 09/19/2017 with a chief complaint of UTI  Assessment:  Patient now with bacteremia as noted below (include suspected source if known)  Name of physician (or Provider) Contacted: X. Blount  Current antibiotics: Ceftriaxone 1gm iv q24hr    Changes to prescribed antibiotics recommended:  Recommendations accepted by provider  Change to Ceftriaxone 2gm iv q24hr   Results for orders placed or performed during the hospital encounter of 09/19/17  Blood Culture ID Panel (Reflexed) (Collected: 09/19/2017  1:35 AM)  Result Value Ref Range   Enterococcus species NOT DETECTED NOT DETECTED   Listeria monocytogenes NOT DETECTED NOT DETECTED   Staphylococcus species NOT DETECTED NOT DETECTED   Staphylococcus aureus NOT DETECTED NOT DETECTED   Streptococcus species NOT DETECTED NOT DETECTED   Streptococcus agalactiae NOT DETECTED NOT DETECTED   Streptococcus pneumoniae NOT DETECTED NOT DETECTED   Streptococcus pyogenes NOT DETECTED NOT DETECTED   Acinetobacter baumannii NOT DETECTED NOT DETECTED   Enterobacteriaceae species DETECTED (A) NOT DETECTED   Enterobacter cloacae complex NOT DETECTED NOT DETECTED   Escherichia coli NOT DETECTED NOT DETECTED   Klebsiella oxytoca NOT DETECTED NOT DETECTED   Klebsiella pneumoniae DETECTED (A) NOT DETECTED   Proteus species NOT DETECTED NOT DETECTED   Serratia marcescens NOT DETECTED NOT DETECTED   Carbapenem resistance NOT DETECTED NOT DETECTED   Haemophilus influenzae NOT DETECTED NOT DETECTED   Neisseria meningitidis NOT DETECTED NOT DETECTED   Pseudomonas aeruginosa NOT DETECTED NOT DETECTED   Candida albicans NOT DETECTED NOT DETECTED   Candida glabrata NOT DETECTED NOT DETECTED   Candida krusei NOT DETECTED NOT DETECTED   Candida parapsilosis NOT DETECTED NOT DETECTED   Candida tropicalis NOT  DETECTED NOT DETECTED    [name] 09/20/2017  5:22 AM

## 2017-09-21 ENCOUNTER — Inpatient Hospital Stay (HOSPITAL_COMMUNITY): Payer: Medicare Other

## 2017-09-21 DIAGNOSIS — I10 Essential (primary) hypertension: Secondary | ICD-10-CM

## 2017-09-21 HISTORY — PX: TRANSTHORACIC ECHOCARDIOGRAM: SHX275

## 2017-09-21 LAB — CULTURE, BLOOD (ROUTINE X 2)
Special Requests: ADEQUATE
Special Requests: ADEQUATE

## 2017-09-21 LAB — ECHOCARDIOGRAM COMPLETE
AVLVOTPG: 4 mmHg
CHL CUP DOP CALC LVOT VTI: 15.6 cm
E decel time: 204 msec
FS: 26 % — AB (ref 28–44)
IV/PV OW: 0.92
LA ID, A-P, ES: 45 mm
LA diam index: 1.9 cm/m2
LAVOLA4C: 54.4 mL
LDCA: 3.46 cm2
LEFT ATRIUM END SYS DIAM: 45 mm
LV TDI E'LATERAL: 8.92
LV e' LATERAL: 8.92 cm/s
LVOT SV: 54 mL
LVOT diameter: 21 mm
LVOT peak vel: 96.7 cm/s
Lateral S' vel: 10.4 cm/s
MV Dec: 204
PW: 13 mm — AB (ref 0.6–1.1)
RV TAPSE: 16.3 mm

## 2017-09-21 LAB — CBC
HEMATOCRIT: 35.8 % — AB (ref 36.0–46.0)
HEMOGLOBIN: 11.5 g/dL — AB (ref 12.0–15.0)
MCH: 29.5 pg (ref 26.0–34.0)
MCHC: 32.1 g/dL (ref 30.0–36.0)
MCV: 91.8 fL (ref 78.0–100.0)
Platelets: 163 10*3/uL (ref 150–400)
RBC: 3.9 MIL/uL (ref 3.87–5.11)
RDW: 15.4 % (ref 11.5–15.5)
WBC: 12.4 10*3/uL — AB (ref 4.0–10.5)

## 2017-09-21 LAB — T3: T3 TOTAL: 21 ng/dL — AB (ref 71–180)

## 2017-09-21 LAB — URINE CULTURE
Culture: >=100000 — AB
Culture: <10000 — AB

## 2017-09-21 LAB — BASIC METABOLIC PANEL
ANION GAP: 18 — AB (ref 5–15)
BUN: 107 mg/dL — ABNORMAL HIGH (ref 6–20)
CHLORIDE: 106 mmol/L (ref 101–111)
CO2: 15 mmol/L — AB (ref 22–32)
CREATININE: 8.2 mg/dL — AB (ref 0.44–1.00)
Calcium: 8.2 mg/dL — ABNORMAL LOW (ref 8.9–10.3)
GFR calc non Af Amer: 5 mL/min — ABNORMAL LOW (ref >60)
GFR, EST AFRICAN AMERICAN: 5 mL/min — AB (ref >60)
Glucose, Bld: 169 mg/dL — ABNORMAL HIGH (ref 65–99)
POTASSIUM: 4.7 mmol/L (ref 3.5–5.1)
SODIUM: 139 mmol/L (ref 135–145)

## 2017-09-21 MED ORDER — SIMETHICONE 80 MG PO CHEW
160.0000 mg | CHEWABLE_TABLET | Freq: Two times a day (BID) | ORAL | Status: DC | PRN
  Administered 2017-09-21: 160 mg via ORAL
  Filled 2017-09-21: qty 2

## 2017-09-21 MED ORDER — SODIUM BICARBONATE 650 MG PO TABS
650.0000 mg | ORAL_TABLET | Freq: Three times a day (TID) | ORAL | Status: DC
  Administered 2017-09-21 – 2017-09-26 (×16): 650 mg via ORAL
  Filled 2017-09-21 (×15): qty 1

## 2017-09-21 MED ORDER — SODIUM BICARBONATE 8.4 % IV SOLN
INTRAVENOUS | Status: AC
  Administered 2017-09-21 (×2): via INTRAVENOUS
  Filled 2017-09-21 (×2): qty 150

## 2017-09-21 MED ORDER — NITROGLYCERIN 2 % TD OINT
0.5000 [in_us] | TOPICAL_OINTMENT | Freq: Four times a day (QID) | TRANSDERMAL | Status: AC
  Administered 2017-09-21: 0.5 [in_us] via TOPICAL
  Filled 2017-09-21: qty 30

## 2017-09-21 MED ORDER — LEVOTHYROXINE SODIUM 100 MCG PO TABS
200.0000 ug | ORAL_TABLET | Freq: Every day | ORAL | Status: DC
  Administered 2017-09-22 – 2017-09-26 (×5): 200 ug via ORAL
  Filled 2017-09-21 (×5): qty 2

## 2017-09-21 MED ORDER — FUROSEMIDE 10 MG/ML IJ SOLN
60.0000 mg | Freq: Once | INTRAMUSCULAR | Status: AC
  Administered 2017-09-21: 60 mg via INTRAVENOUS
  Filled 2017-09-21: qty 6

## 2017-09-21 MED ORDER — IPRATROPIUM-ALBUTEROL 0.5-2.5 (3) MG/3ML IN SOLN
3.0000 mL | Freq: Three times a day (TID) | RESPIRATORY_TRACT | Status: DC
  Administered 2017-09-21 – 2017-09-23 (×6): 3 mL via RESPIRATORY_TRACT
  Filled 2017-09-21 (×6): qty 3

## 2017-09-21 NOTE — Progress Notes (Signed)
Referring Physician(s): McKenzie,P  Supervising Physician: Daryll Brod  Patient Status:  [facility]  Chief Complaint: Renal stones, left hydronephrosis   Subjective: Pt doing ok ;still has some dyspneic episodes/occ cough; denies worsening flank pain but does have some intermittent abd discomfort after coughing; no N/V   Allergies: Band-aid plus antibiotic [bacitracin-polymyxin b] and Iodine  Medications: Prior to Admission medications   Medication Sig Start Date End Date Taking? Authorizing Provider  acetaminophen (TYLENOL) 325 MG tablet Take 650 mg by mouth every 6 (six) hours as needed for mild pain.    Yes [provider]  albuterol (PROVENTIL HFA;VENTOLIN HFA) 108 (90 BASE) MCG/ACT inhaler Inhale 2 puffs into the lungs every 6 (six) hours as needed for wheezing or shortness of breath.   Yes [provider]  Ascorbic Acid (VITAMIN C) 1000 MG tablet Take 1,000 mg by mouth daily.    Yes [provider]  cetirizine (ZYRTEC) 10 MG tablet Take 10 mg by mouth daily.   Yes [provider]  ergocalciferol (VITAMIN D2) 50000 UNITS capsule Take 50,000 Units by mouth every Sunday.    Yes [provider]  fenofibrate 160 MG tablet Take 160 mg by mouth daily. 09/15/17  Yes [provider]  ibuprofen (ADVIL,MOTRIN) 200 MG tablet Take 400 mg by mouth every 6 (six) hours as needed for headache, mild pain or moderate pain.    Yes [provider]  levothyroxine (SYNTHROID, LEVOTHROID) 175 MCG tablet Take 175 mcg by mouth daily before breakfast.   Yes [provider]  lisinopril (PRINIVIL,ZESTRIL) 20 MG tablet Take 40 mg by mouth daily.    Yes [provider]  Multiple Vitamins-Minerals (MULTIVITAMIN WITH MINERALS) tablet Take 1 tablet by mouth daily.   Yes [provider]     Vital Signs: BP 134/68 (BP Location: Left Arm)   Pulse (!) 103   Temp (!) 97.5 F (36.4 C) (Oral)   Resp 20   Ht 5\' 8"   (1.727 m)   Wt 282 lb (127.9 kg)   SpO2 93%   BMI 42.88 kg/m   Physical Exam left PCN intact, output 225 cc blood tinged urine; dressing dry, site not sig tender  Imaging: Ct Abdomen Pelvis Wo Contrast  Result Date: 09/19/2017 CLINICAL DATA:  Abdominal pain and hematuria. EXAM: CT ABDOMEN AND PELVIS WITHOUT CONTRAST TECHNIQUE: Multidetector CT imaging of the abdomen and pelvis was performed following the standard protocol without IV contrast. COMPARISON:  CT 09/25/2014 FINDINGS: Lower chest: Upper normal heart size. Bilateral lower lobe atelectasis, breathing motion artifact. Hepatobiliary: The liver is enlarged spanning 22.5 cm with diffuse steatosis. No evidence of focal lesion allowing for lack contrast. Possible sludge in the gallbladder which is physiologically distended. No calcified stone. No biliary dilatation. Pancreas: No ductal dilatation or inflammation. Spleen: Enlarged measuring 16.3 cm AP. Adrenals/Urinary Tract: No adrenal nodule. Obstructing 7 mm stone in the distal right ureter just proximal to the ureterovesicular junction with mild right hydroureteronephrosis and perinephric edema. Two additional nonobstructing stones in the upper right kidney. Probable cyst in the mid right kidney. Chronic 15 mm stone at the left ureteropelvic junction with chronic severe hydronephrosis. Progressive parenchymal thinning from prior exam. No left perinephric edema. Nonobstructing stone versus parenchymal calcification in the mid left kidney. Urinary bladder is completely decompressed. Stomach/Bowel: Multifocal colonic diverticulosis without diverticulitis. Normal appendix. No bowel obstruction, inflammation or wall thickening. Vascular/Lymphatic: Aortic atherosclerosis. Retroaortic left renal vein. Increased number of multiple small retroperitoneal lymph nodes. Largest node  measures 11 mm short axis in the pericaval region at the level of the right kidney. Small periportal nodes are likely reactive.  Reproductive: Status post hysterectomy. No adnexal masses. Other: No free air, free fluid, or intra-abdominal fluid collection. Musculoskeletal: Multilevel degenerative change throughout the lumbar spine. There are no acute or suspicious osseous abnormalities. IMPRESSION: 1. Obstructing 7 mm stone in the distal right ureter just proximal to the ureteropelvic junction with mild hydroureteronephrosis and perinephric edema. 2. 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 3. Additional nonobstructing right renal calculi. 4. Hepatosplenomegaly and hepatic steatosis. 5. Colonic diverticulosis without diverticulitis. 6.  Aortic Atherosclerosis (ICD10-I70.0). Electronically Signed   By: Jeb Levering M.D.   On: 09/19/2017 05:42   Dg Chest 2 View  Result Date: 09/19/2017 CLINICAL DATA:  Nausea. EXAM: CHEST  2 VIEW COMPARISON:  06/24/2014 FINDINGS: Very low lung volumes on the AP view limit assessment. The heart is enlarged. Bronchovascular crowding with peribronchial cuffing. No confluent airspace disease. No large pleural effusion. No pneumothorax. Body habitus limits detailed assessment. Chronic change about the left shoulder. IMPRESSION: Cardiomegaly. Peribronchial cuffing favored to be bronchial inflammation over pulmonary edema. Electronically Signed   By: Jeb Levering M.D.   On: 09/19/2017 02:57   Dg Chest Port 1 View  Result Date: 09/21/2017 CLINICAL DATA:  Followup shortness of Breath EXAM: PORTABLE CHEST 1 VIEW COMPARISON:  09/19/2017 FINDINGS: Slight improvement in aeration. Mild residual right base atelectasis. No focal opacity on the left. Heart is mildly enlarged. Suspect small layering right effusion. IMPRESSION: Small right effusion with right base atelectasis. Some improvement in aeration since prior study. Electronically Signed   By: Rolm Baptise M.D.   On: 09/21/2017 09:46   Dg C-arm 1-60  Min-no Report  Result Date: 09/19/2017 Fluoroscopy was utilized by the requesting physician.  No radiographic interpretation.   Ir Nephrostomy Placement Left  Result Date: 09/20/2017 INDICATION: 62 year old female with bilateral obstructed hydronephrosis secondary to stone disease. Double-J stent placed successfully on the right yesterday. Unable to place internal stent on the left. Patient presents for percutaneous nephrostomy placement. EXAM: IR NEPHROSTOMY PLACEMENT LEFT COMPARISON:  CT scan of the abdomen and pelvis 09/19/2017 MEDICATIONS: 2 g Ancef; The antibiotic was administered in an appropriate time frame prior to skin puncture. ANESTHESIA/SEDATION: Fentanyl 1 mcg IV; Versed 50 mg IV Moderate Sedation Time:  10 minutes The patient was continuously monitored during the procedure by the interventional radiology nurse under my direct supervision. CONTRAST:  15 mL Isovue 370-administered into the collecting system(s) FLUOROSCOPY TIME:  Fluoroscopy Time: 0 minutes 30 seconds (31 mGy). COMPLICATIONS: None immediate. TECHNIQUE: The procedure, risks, benefits, and alternatives were explained to the patient. Questions regarding the procedure were encouraged and answered. The patient understands and consents to the procedure. The left flank was prepped with chlorhexidine in a sterile fashion, and a sterile drape was applied covering the operative field. A sterile gown and sterile gloves were used for the procedure. Local anesthesia was provided with 1% Lidocaine. The left flank was interrogated with ultrasound and the left kidney identified. The kidney is hydronephrotic. A suitable access site on the skin overlying the lower pole, posterior calix was identified. After local mg anesthesia was achieved, a small skin nick was made with an 11 blade scalpel. A 21 gauge Accustick needle was then advanced under direct sonographic guidance into the lower pole of the left kidney. A 0.018 inch wire  was advanced under  fluoroscopic guidance into the left renal collecting system. The Accustick sheath was then advanced over the wire and a 0.018 system exchanged for a 0.035 system. Gentle hand injection of contrast material confirms placement of the sheath within the renal collecting system. There is marked hydronephrosis. The tract from the scan into the renal collecting system was then dilated serially to 10-French. A 10-French Cook all-purpose drain was then placed and positioned under fluoroscopic guidance. The locking loop is well formed within the left renal pelvis. The catheter was secured to the skin with 2-0 Prolene and a sterile bandage was placed. Catheter was left to gravity bag drainage. IMPRESSION: Successful placement of a left 10 French percutaneous nephrostomy tube. PLAN: 1. Maintain tube to gravity bag drainage. 2. Continue to trend creatinine levels. 3. Return to interventional Radiology in 6 weeks for tube check and change. An attempt at internalization to a percutaneous nephroureteral tube, or an internal double-J ureteral stent could be considered at that time. Signed, Criselda Peaches, MD [facility] Electronically Signed   By: Jacqulynn Cadet M.D.   On: 09/20/2017 13:43    Labs:  CBC: Recent Labs    09/18/17 2349 09/20/17 0555 09/21/17 0514  WBC 14.9* 9.6 12.4*  HGB 11.8* 10.7* 11.5*  HCT 34.6* 33.4* 35.8*  PLT 177 166 163    COAGS: Recent Labs    09/19/17 1316  INR 1.08    BMP: Recent Labs    09/18/17 2349 09/20/17 0555 09/21/17 0514  NA 133* 139 139  K 4.5 5.1 4.7  CL 101 107 106  CO2 14* 14* 15*  GLUCOSE 144* 216* 169*  BUN 113* 119* 107*  CALCIUM 9.4 8.3* 8.2*  CREATININE 8.97* 8.65* 8.20*  GFRNONAA 4* 4* 5*  GFRAA 5* 5* 5*    LIVER FUNCTION TESTS: Recent Labs    09/18/17 2349  BILITOT 0.8  AST 31  ALT 50  ALKPHOS 120  PROT 6.7  ALBUMIN 2.7*    Assessment and Plan: Pt with hx  nephrolithiasis, obstructive bilat hydronephrosis; s/p rt JJ by urology 2/17, left PCN in IR 2/18; afebrile; creat 8.2(8.65); WBC 12.4(9.6); antbx/plans as per urology; CXR today- small rt effusion with some improvement in aeration since previous study, echo pend   Electronically Signed: D. Rowe Robert, PA-C 09/21/2017, 3:44 PM   I spent a total of 15 minutes at the the patient's bedside AND on the patient's hospital floor or unit, greater than 50% of which was counseling/coordinating care for left nephrostomy    Patient ID: Sheila Leach, female   DOB: 1956-01-07, 62 y.o.   MRN: [id number]

## 2017-09-21 NOTE — Progress Notes (Signed)
  Echocardiogram 2D Echocardiogram has been performed.  Sheila Leach 09/21/2017, 4:38 PM

## 2017-09-21 NOTE — Progress Notes (Signed)
@IPLOG @        PROGRESS NOTE                                                                                                                                                                                                             Patient Demographics:    Sheila Leach, is a 62 y.o. female, DOB - [birth year], BMW:[id number]  Admit date - 09/19/2017   Admitting Physician Norval Morton, MD  Outpatient Primary MD for the patient is Shon Baton, MD  LOS - 2  Chief Complaint  Patient presents with  . Hematuria  . Emesis       Brief Narrative  Sheila Leach is a 62 y.o. female with PMH of DJD, HTN, Asthma, Hypothyroidism, CKD, Nephrolithiasis, presented with nausea, vomiting, diarrhea associated with flank pains for several days. She has developed worsening back pains with hematuria last night and presented to emergency department for further evaluation. No hematemesis, no hematochezia, no acute abdominal pains but had mild lower abdominal discomfort , no acute chest pains or shortness of breath. She had mild productive cough, no wheezing. He reports some chills no fevers. She reports taking nsaids for few days   ED: CT abd showed hydronephrosis, creatinine 8.7, previously at 1.4. ED d/w urology who plans to take her to surgery today. hospitalist is called for admission     Subjective:    Sheila Leach today has, No headache, No chest pain, No abdominal pain - No Nausea, No new weakness tingling or numbness, No Cough - SOB.    Assessment  & Plan :     Bilateral ureteric obstruction with hydronephrosis and acute renal failure.  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  She underwent cystoscopy with right-sided double-J stent placement on 09/19/2017 along with left-sided percutaneous nephrostomy tube placement by IR on 09/20/2017.  Urine output is good, renal  function is plateauing and gradually improving, case discussed with nephrologist Dr. Jonnie Finner on 09/21/2017.  Continue gentle hydration.  Avoid nephrotoxins and monitor renal function.   Possible early pyelonephritis due to #1 above.  Continue Rocephin follow cultures.  ARF with wide anion gap metabolic acidosis and hyperkalemia.  Due to obstructive uropathy as above, hold nephrotoxins, hydrate, monitor BMP, continue bicarb supplementation along with Kayexalate and Lasix for hyperkalemia, hyperkalemia has now resolved.  Case discussed with nephrology on 09/21/2017.  Continue hydration renal function to likely recover in the next  24-48 hours.  Asthma. Chronic . No s/s of exacerbation. Cont prn bronchodilators will add 3 times daily scheduled as well as she is slightly short of breath on 09/21/2017.  Hypothyroidism, TSH was elevated and T3 was low, Synthroid dose increased to 200 mcg daily.  Repeat TSH along with T3 and free T4 in 4-6 weeks by PCP.  HTN. BP is soft,. Hold lisinopril, hydrate as tolerated.  Nausea, vomiting diarrhea in the setting of UTI, renal stones. abd exam is unremarkable.  Improved with supportive care.  Shortness of breath.  Has developed rails on 09/21/2017, likely fluid overload, will give her a challenge of IV Lasix, reduce IV fluids, trial of half inch Nitropaste, oxygen nebulizer treatments as needed and monitor.  We will check echocardiogram as well.     Diet : Diet renal with fluid restriction Fluid restriction: 1200 mL Fluid; Room service appropriate? Yes; Fluid consistency: Thin  Family Communication  : None present  Code Status : Full  Disposition Plan  : To be decided  Consults  : Urology, IR  Procedures  :    CT -  IMPRESSION: 1. Obstructing 7 mm stone in the distal right ureter just proximal to the ureteropelvic junction with mild hydroureteronephrosis and perinephric edema. 2. Chronic 15 mm left UPJ calculus with chronic left hydronephrosis, severe  with progressive thinning of the renal parenchyma over the past 3 years. 3. Additional nonobstructing right renal calculi. 4. Hepatosplenomegaly and hepatic steatosis. 5. Colonic diverticulosis without diverticulitis. 6.  Aortic Atherosclerosis   L.  Nephrostomy tube placement by IR.  On 09/20/2017  Cystoscopy with right-sided double-J stent placement on 09/19/2017 by urology.   DVT Prophylaxis  :   Heparin   Lab Results  Component Value Date   PLT 163 09/21/2017    Inpatient Medications  Scheduled Meds: . heparin injection (subcutaneous)  5,000 Units Subcutaneous Q8H  . levothyroxine  175 mcg Oral QAC breakfast  . loratadine  10 mg Oral Daily  . nitroGLYCERIN  0.5 inch Topical Q6H  . sodium bicarbonate  650 mg Oral TID  . vitamin C  1,000 mg Oral Daily  . Vitamin D (Ergocalciferol)  50,000 Units Oral Q Sun   Continuous Infusions: . cefTRIAXone (ROCEPHIN)  IV Stopped (09/20/17 2130)  .  sodium bicarbonate  infusion 1000 mL 100 mL/hr at 09/21/17 0810   PRN Meds:.acetaminophen **OR** acetaminophen, HYDROcodone-acetaminophen, iopamidol, ipratropium-albuterol, ondansetron  Antibiotics  :    Anti-infectives (From admission, onward)   Start     Dose/Rate Route Frequency Ordered Stop   09/20/17 1230  ceFAZolin (ANCEF) IVPB 2g/100 mL premix     2 g 200 mL/hr over 30 Minutes Intravenous  Once 09/20/17 1229 09/20/17 1524   09/20/17 0300  cefTRIAXone (ROCEPHIN) 1 g in sodium chloride 0.9 % 100 mL IVPB  Status:  Discontinued     1 g 200 mL/hr over 30 Minutes Intravenous Every 24 hours 09/19/17 0813 09/19/17 2311   09/19/17 2315  cefTRIAXone (ROCEPHIN) 2 g in sodium chloride 0.9 % 100 mL IVPB     2 g 200 mL/hr over 30 Minutes Intravenous Daily at bedtime 09/19/17 2311     09/19/17 2200  cefTRIAXone (ROCEPHIN) 2 g in sodium chloride 0.9 % 100 mL IVPB  Status:  Discontinued     2 g 200 mL/hr over 30 Minutes Intravenous Every 24 hours 09/19/17 0151 09/19/17 0813   09/19/17 0130   cefTRIAXone (ROCEPHIN) 2 g in sodium chloride 0.9 % 100 mL IVPB  2 g 200 mL/hr over 30 Minutes Intravenous  Once 09/19/17 0129 09/19/17 0344         Objective:   Vitals:   09/20/17 1408 09/20/17 2118 09/21/17 0500 09/21/17 0823  BP: 116/77 (!) 122/91 129/81   Pulse: (!) 103 (!) 105 (!) 102   Resp: 18 20 19    Temp: 98.1 F (36.7 C) 97.8 F (36.6 C) 98 F (36.7 C)   TempSrc: Oral Oral Oral   SpO2: 92% 94% 96% 95%  Weight:      Height:        Wt Readings from Last 3 Encounters:  09/18/17 127.9 kg (282 lb)  02/11/17 127 kg (280 lb)  03/24/16 127.9 kg (282 lb)     Intake/Output Summary (Last 24 hours) at 09/21/2017 1054 Last data filed at 09/21/2017 0936 Gross per 24 hour  Intake 910 ml  Output 2600 ml  Net -1690 ml     Physical Exam  Awake Alert, Oriented X 3, No new F.N deficits, Normal affect [place].AT,PERRAL Supple Neck,No JVD, No cervical lymphadenopathy appriciated.  Symmetrical Chest wall movement, Good air movement bilaterally, +ve rales RRR,No Gallops,Rubs or new Murmurs, No Parasternal Heave +ve B.Sounds, Abd Soft, No tenderness, No organomegaly appriciated, No rebound - guarding or rigidity. L nephrostomy tube, foley in place,  No Cyanosis, Clubbing or edema, No new Rash or bruise     Data Review:    CBC Recent Labs  Lab 09/18/17 2349 09/20/17 0555 09/21/17 0514  WBC 14.9* 9.6 12.4*  HGB 11.8* 10.7* 11.5*  HCT 34.6* 33.4* 35.8*  PLT 177 166 163  MCV 88.3 92.3 91.8  MCH 30.1 29.6 29.5  MCHC 34.1 32.0 32.1  RDW 14.4 15.1 15.4    Chemistries  Recent Labs  Lab 09/18/17 2349 09/20/17 0555 09/21/17 0514  NA 133* 139 139  K 4.5 5.1 4.7  CL 101 107 106  CO2 14* 14* 15*  GLUCOSE 144* 216* 169*  BUN 113* 119* 107*  CREATININE 8.97* 8.65* 8.20*  CALCIUM 9.4 8.3* 8.2*  AST 31  --   --   ALT 50  --   --   ALKPHOS 120  --   --   BILITOT 0.8  --   --     ------------------------------------------------------------------------------------------------------------------ No results for input(s): CHOL, HDL, LDLCALC, TRIG, CHOLHDL, LDLDIRECT in the last 72 hours.  No results found for: HGBA1C ------------------------------------------------------------------------------------------------------------------ Recent Labs    09/19/17 1316  TSH 13.315*   ------------------------------------------------------------------------------------------------------------------ No results for input(s): VITAMINB12, FOLATE, FERRITIN, TIBC, IRON, RETICCTPCT in the last 72 hours.  Coagulation profile Recent Labs  Lab 09/19/17 1316  INR 1.08    No results for input(s): DDIMER in the last 72 hours.  Cardiac Enzymes No results for input(s): CKMB, TROPONINI, MYOGLOBIN in the last 168 hours.  Invalid input(s): CK ------------------------------------------------------------------------------------------------------------------    Component Value Date/Time   BNP 251.6 (H) 09/19/2017 1316    Micro Results Recent Results (from the past 240 hour(s))  Culture, Urine     Status: Abnormal   Collection Time: 09/18/17 11:30 PM  Result Value Ref Range Status   Specimen Description   Final    URINE, CLEAN CATCH Performed at [facility] [address] [place], [place] [address]    Special Requests   Final    catheterize if needed Performed at [facility], [place] [address] [place], [place] [address]    Culture >=100,000 COLONIES/mL KLEBSIELLA PNEUMONIAE (A)  Final   Report Status  09/21/2017 FINAL  Final   Organism ID, Bacteria KLEBSIELLA PNEUMONIAE (A)  Final      Susceptibility   Klebsiella pneumoniae - MIC*    AMPICILLIN >=32 RESISTANT Resistant     CEFAZOLIN <=4 SENSITIVE Sensitive     CEFTRIAXONE <=1 SENSITIVE Sensitive     CIPROFLOXACIN >=4 RESISTANT Resistant     GENTAMICIN <=1 SENSITIVE Sensitive      IMIPENEM <=0.25 SENSITIVE Sensitive     NITROFURANTOIN 128 RESISTANT Resistant     TRIMETH/SULFA >=320 RESISTANT Resistant     AMPICILLIN/SULBACTAM 4 SENSITIVE Sensitive     PIP/TAZO <=4 SENSITIVE Sensitive     Extended ESBL NEGATIVE Sensitive     * >=100,000 COLONIES/mL KLEBSIELLA PNEUMONIAE  Blood Culture (routine x 2)     Status: Abnormal   Collection Time: 09/19/17  1:35 AM  Result Value Ref Range Status   Specimen Description   Final    BLOOD RIGHT ANTECUBITAL Performed at [facility] [address], [place], [place] [address]    Special Requests   Final    BOTTLES DRAWN AEROBIC AND ANAEROBIC Blood Culture adequate volume Performed at [facility] [address], [place], [place] [address]    Culture  Setup Time   Final    IN BOTH AEROBIC AND ANAEROBIC BOTTLES CRITICAL VALUE NOTED.  VALUE IS CONSISTENT WITH PREVIOUSLY REPORTED AND CALLED VALUE. GRAM NEGATIVE RODS    Culture (A)  Final    KLEBSIELLA PNEUMONIAE SUSCEPTIBILITIES PERFORMED ON PREVIOUS CULTURE WITHIN THE LAST 5 DAYS. Performed at [facility], [place] [address], [place], [place] [address]    Report Status 09/21/2017 FINAL  Final  Blood Culture (routine x 2)     Status: Abnormal   Collection Time: 09/19/17  1:35 AM  Result Value Ref Range Status   Specimen Description   Final    BLOOD LEFT ANTECUBITAL Performed at [facility] [address] [place], [place] [address]    Special Requests   Final    BOTTLES DRAWN AEROBIC AND ANAEROBIC Blood Culture adequate volume Performed at [facility] [address] [place], [place] [address]    Culture  Setup Time   Final    GRAM NEGATIVE RODS IN BOTH AEROBIC AND ANAEROBIC BOTTLES CRITICAL RESULT CALLED TO, READ BACK BY AND VERIFIED WITH: Lavell Luster [facility] 0102 09/19/17 HMILES Performed at [facility], [place] [address], [place], [place] [address]    Culture KLEBSIELLA  PNEUMONIAE (A)  Final   Report Status 09/21/2017 FINAL  Final   Organism ID, Bacteria KLEBSIELLA PNEUMONIAE  Final      Susceptibility   Klebsiella pneumoniae - MIC*    AMPICILLIN >=32 RESISTANT Resistant     CEFAZOLIN <=4 SENSITIVE Sensitive     CEFEPIME <=1 SENSITIVE Sensitive     CEFTAZIDIME <=1 SENSITIVE Sensitive     CEFTRIAXONE <=1 SENSITIVE Sensitive     CIPROFLOXACIN >=4 RESISTANT Resistant     GENTAMICIN <=1 SENSITIVE Sensitive     IMIPENEM <=0.25 SENSITIVE Sensitive     TRIMETH/SULFA >=320 RESISTANT Resistant     AMPICILLIN/SULBACTAM 4 SENSITIVE Sensitive     PIP/TAZO <=4 SENSITIVE Sensitive     Extended ESBL NEGATIVE Sensitive     * KLEBSIELLA PNEUMONIAE  Blood Culture ID Panel (Reflexed)     Status: Abnormal   Collection Time: 09/19/17  1:35 AM  Result Value Ref Range Status   Enterococcus species NOT DETECTED NOT DETECTED Final  Listeria monocytogenes NOT DETECTED NOT DETECTED Final   Staphylococcus species NOT DETECTED NOT DETECTED Final   Staphylococcus aureus NOT DETECTED NOT DETECTED Final   Streptococcus species NOT DETECTED NOT DETECTED Final   Streptococcus agalactiae NOT DETECTED NOT DETECTED Final   Streptococcus pneumoniae NOT DETECTED NOT DETECTED Final   Streptococcus pyogenes NOT DETECTED NOT DETECTED Final   Acinetobacter baumannii NOT DETECTED NOT DETECTED Final   Enterobacteriaceae species DETECTED (A) NOT DETECTED Final    Comment: Enterobacteriaceae represent a large family of gram-negative bacteria, not a single organism. CRITICAL RESULT CALLED TO, READ BACK BY AND VERIFIED WITH: Lavell Luster PHARMD 2309 09/19/17 HMILES    Enterobacter cloacae complex NOT DETECTED NOT DETECTED Final   Escherichia coli NOT DETECTED NOT DETECTED Final   Klebsiella oxytoca NOT DETECTED NOT DETECTED Final   Klebsiella pneumoniae DETECTED (A) NOT DETECTED Final    Comment: CRITICAL RESULT CALLED TO, READ BACK BY AND VERIFIED WITH: J.GRIMSLEY PHARMD 2309 09/19/17  HMILES    Proteus species NOT DETECTED NOT DETECTED Final   Serratia marcescens NOT DETECTED NOT DETECTED Final   Carbapenem resistance NOT DETECTED NOT DETECTED Final   Haemophilus influenzae NOT DETECTED NOT DETECTED Final   Neisseria meningitidis NOT DETECTED NOT DETECTED Final   Pseudomonas aeruginosa NOT DETECTED NOT DETECTED Final   Candida albicans NOT DETECTED NOT DETECTED Final   Candida glabrata NOT DETECTED NOT DETECTED Final   Candida krusei NOT DETECTED NOT DETECTED Final   Candida parapsilosis NOT DETECTED NOT DETECTED Final   Candida tropicalis NOT DETECTED NOT DETECTED Final    Comment: Performed at [facility], [place]. [address] [place], [place] [address]  Urine Culture     Status: Abnormal   Collection Time: 09/19/17 10:17 AM  Result Value Ref Range Status   Specimen Description   Final    URINE, CATHETERIZED CYSTO Performed at [facility] [address], [place], [place] [address]    Special Requests   Final    NONE Performed at [facility], [place] [address] [place], [place] [address]    Culture (A)  Final    <10,000 COLONIES/mL INSIGNIFICANT GROWTH Performed at [facility] [address], [place], [place] [address]    Report Status 09/21/2017 FINAL  Final    Radiology Reports Ct Abdomen Pelvis Wo Contrast  Result Date: 09/19/2017 CLINICAL DATA:  Abdominal pain and hematuria. EXAM: CT ABDOMEN AND PELVIS WITHOUT CONTRAST TECHNIQUE: Multidetector CT imaging of the abdomen and pelvis was performed following the standard protocol without IV contrast. COMPARISON:  CT 09/25/2014 FINDINGS: Lower chest: Upper normal heart size. Bilateral lower lobe atelectasis, breathing motion artifact. Hepatobiliary: The liver is enlarged spanning 22.5 cm with diffuse steatosis. No evidence of focal lesion allowing for lack contrast. Possible sludge in the gallbladder which is physiologically distended. No calcified stone. No biliary  dilatation. Pancreas: No ductal dilatation or inflammation. Spleen: Enlarged measuring 16.3 cm AP. Adrenals/Urinary Tract: No adrenal nodule. Obstructing 7 mm stone in the distal right ureter just proximal to the ureterovesicular junction with mild right hydroureteronephrosis and perinephric edema. Two additional nonobstructing stones in the upper right kidney. Probable cyst in the mid right kidney. Chronic 15 mm stone at the left ureteropelvic junction with chronic severe hydronephrosis. Progressive parenchymal thinning from prior exam. No left perinephric edema. Nonobstructing stone versus parenchymal calcification in the mid left kidney. Urinary bladder is completely decompressed. Stomach/Bowel: Multifocal colonic diverticulosis without diverticulitis. Normal appendix. No bowel obstruction, inflammation  or wall thickening. Vascular/Lymphatic: Aortic atherosclerosis. Retroaortic left renal vein. Increased number of multiple small retroperitoneal lymph nodes. Largest node measures 11 mm short axis in the pericaval region at the level of the right kidney. Small periportal nodes are likely reactive. Reproductive: Status post hysterectomy. No adnexal masses. Other: No free air, free fluid, or intra-abdominal fluid collection. Musculoskeletal: Multilevel degenerative change throughout the lumbar spine. There are no acute or suspicious osseous abnormalities. IMPRESSION: 1. Obstructing 7 mm stone in the distal right ureter just proximal to the ureteropelvic junction with mild hydroureteronephrosis and perinephric edema. 2. 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 3. Additional nonobstructing right renal calculi. 4. Hepatosplenomegaly and hepatic steatosis. 5. Colonic diverticulosis without diverticulitis. 6.  Aortic Atherosclerosis (ICD10-I70.0). Electronically Signed   By: Jeb Levering M.D.   On: 09/19/2017 05:42    Dg Chest 2 View  Result Date: 09/19/2017 CLINICAL DATA:  Nausea. EXAM: CHEST  2 VIEW COMPARISON:  06/24/2014 FINDINGS: Very low lung volumes on the AP view limit assessment. The heart is enlarged. Bronchovascular crowding with peribronchial cuffing. No confluent airspace disease. No large pleural effusion. No pneumothorax. Body habitus limits detailed assessment. Chronic change about the left shoulder. IMPRESSION: Cardiomegaly. Peribronchial cuffing favored to be bronchial inflammation over pulmonary edema. Electronically Signed   By: Jeb Levering M.D.   On: 09/19/2017 02:57   Dg Chest Port 1 View  Result Date: 09/21/2017 CLINICAL DATA:  Followup shortness of Breath EXAM: PORTABLE CHEST 1 VIEW COMPARISON:  09/19/2017 FINDINGS: Slight improvement in aeration. Mild residual right base atelectasis. No focal opacity on the left. Heart is mildly enlarged. Suspect small layering right effusion. IMPRESSION: Small right effusion with right base atelectasis. Some improvement in aeration since prior study. Electronically Signed   By: Rolm Baptise M.D.   On: 09/21/2017 09:46   Dg C-arm 1-60 Min-no Report  Result Date: 09/19/2017 Fluoroscopy was utilized by the requesting physician.  No radiographic interpretation.   Ir Nephrostomy Placement Left  Result Date: 09/20/2017 INDICATION: 62 year old female with bilateral obstructed hydronephrosis secondary to stone disease. Double-J stent placed successfully on the right yesterday. Unable to place internal stent on the left. Patient presents for percutaneous nephrostomy placement. EXAM: IR NEPHROSTOMY PLACEMENT LEFT COMPARISON:  CT scan of the abdomen and pelvis 09/19/2017 MEDICATIONS: 2 g Ancef; The antibiotic was administered in an appropriate time frame prior to skin puncture. ANESTHESIA/SEDATION: Fentanyl 1 mcg IV; Versed 50 mg IV Moderate Sedation Time:  10 minutes The patient was continuously monitored during the procedure by the interventional radiology  nurse under my direct supervision. CONTRAST:  15 mL Isovue 370-administered into the collecting system(s) FLUOROSCOPY TIME:  Fluoroscopy Time: 0 minutes 30 seconds (31 mGy). COMPLICATIONS: None immediate. TECHNIQUE: The procedure, risks, benefits, and alternatives were explained to the patient. Questions regarding the procedure were encouraged and answered. The patient understands and consents to the procedure. The left flank was prepped with chlorhexidine in a sterile fashion, and a sterile drape was applied covering the operative field. A sterile gown and sterile gloves were used for the procedure. Local anesthesia was provided with 1% Lidocaine. The left flank was interrogated with ultrasound and the left kidney identified. The kidney is hydronephrotic. A suitable access site on the skin overlying the lower pole, posterior calix was identified. After local mg anesthesia was achieved, a small skin nick was made with an 11 blade scalpel. A 21 gauge Accustick  needle was then advanced under direct sonographic guidance into the lower pole of the left kidney. A 0.018 inch wire was advanced under fluoroscopic guidance into the left renal collecting system. The Accustick sheath was then advanced over the wire and a 0.018 system exchanged for a 0.035 system. Gentle hand injection of contrast material confirms placement of the sheath within the renal collecting system. There is marked hydronephrosis. The tract from the scan into the renal collecting system was then dilated serially to 10-French. A 10-French Cook all-purpose drain was then placed and positioned under fluoroscopic guidance. The locking loop is well formed within the left renal pelvis. The catheter was secured to the skin with 2-0 Prolene and a sterile bandage was placed. Catheter was left to gravity bag drainage. IMPRESSION: Successful placement of a left 10 French percutaneous nephrostomy tube. PLAN: 1. Maintain tube to gravity bag drainage. 2. Continue to  trend creatinine levels. 3. Return to interventional Radiology in 6 weeks for tube check and change. An attempt at internalization to a percutaneous nephroureteral tube, or an internal double-J ureteral stent could be considered at that time. Signed, Criselda Peaches, MD [facility] Radiology Electronically Signed   By: Jacqulynn Cadet M.D.   On: 09/20/2017 13:43    Time Spent in minutes  30   Lala Lund M.D on 09/21/2017 at 10:54 AM  Between 7am to 7pm - Pager - [phone] ( page via [email or web address], text pages only, please mention full 10 digit call back number). After 7pm go to [email or web address] - password [email or web address]

## 2017-09-21 NOTE — Progress Notes (Signed)
Patient experiencing wheezing and SOB, unrelieved by Duoneb. MD on floor, notified. Orders placed. MD to see patient at bedside. Will continue to monitor.

## 2017-09-21 NOTE — Progress Notes (Signed)
PT Cancellation Note  Patient Details Name: Sheila Leach MRN: [id number] DOB: 11/26/1955   Cancelled Treatment:    Reason Eval/Treat Not Completed: Attempted PT eval. Pt politely declined participation on today. She is agreeable to PT checking back tomorrow to complete evaluation. Will check back as schedule allows. Thanks.    Weston Anna, MPT Pager: [phone]

## 2017-09-21 NOTE — Consult Note (Signed)
Urology Progress Note   2 Days Post-Op S/p right ureteral stent placement 2/17, L PCN placement 2/18 for bilateral obstructing stones and UTI.  Subjective: Patient feeling better. Has chronic back pain, is endorsing tenderness this AM. Denies nausea vomiting.   Afebrile last 24 hours. Urine and blood cultures growing Klebsiella >100k, sensitivities pending. Creatinine downtrending, albiet slowly (8.2 today). UOP 2.1 L.   Objective: Vital signs in last 24 hours: Temp:  [97.8 F (36.6 C)-98.1 F (36.7 C)] 98 F (36.7 C) (02/19 0500) Pulse Rate:  [102-112] 102 (02/19 0500) Resp:  [18-30] 19 (02/19 0500) BP: (116-142)/(77-106) 129/81 (02/19 0500) SpO2:  [92 %-99 %] 96 % (02/19 0500)  Intake/Output from previous day: 02/18 0701 - 02/19 0700 In: 900 [I.V.:800; IV Piggyback:100] Out: 2125 [Urine:2125] Intake/Output this shift: No intake/output data recorded.  Physical Exam:  General: Alert and oriented CV: RRR Lungs: Clear Abdomen: Soft,  GU: Foley with clear yellow urine in tubing. Left PCN with thin cherry urine in bag and tubing.   Ext: NT, No erythema  Lab Results: Recent Labs    09/18/17 2349 09/20/17 0555 09/21/17 0514  HGB 11.8* 10.7* 11.5*  HCT 34.6* 33.4* 35.8*   BMET Recent Labs    09/20/17 0555 09/21/17 0514  NA 139 139  K 5.1 4.7  CL 107 106  CO2 14* 15*  GLUCOSE 216* 169*  BUN 119* 107*  CREATININE 8.65* 8.20*  CALCIUM 8.3* 8.2*     Studies/Results: Dg C-arm 1-60 Min-no Report  Result Date: 09/19/2017 Fluoroscopy was utilized by the requesting physician.  No radiographic interpretation.   Ir Nephrostomy Placement Left  Result Date: 09/20/2017 INDICATION: 62 year old female with bilateral obstructed hydronephrosis secondary to stone disease. Double-J stent placed successfully on the right yesterday. Unable to place internal stent on the left. Patient presents for percutaneous nephrostomy placement. EXAM: IR NEPHROSTOMY PLACEMENT LEFT  COMPARISON:  CT scan of the abdomen and pelvis 09/19/2017 MEDICATIONS: 2 g Ancef; The antibiotic was administered in an appropriate time frame prior to skin puncture. ANESTHESIA/SEDATION: Fentanyl 1 mcg IV; Versed 50 mg IV Moderate Sedation Time:  10 minutes The patient was continuously monitored during the procedure by the interventional radiology nurse under my direct supervision. CONTRAST:  15 mL Isovue 370-administered into the collecting system(s) FLUOROSCOPY TIME:  Fluoroscopy Time: 0 minutes 30 seconds (31 mGy). COMPLICATIONS: None immediate. TECHNIQUE: The procedure, risks, benefits, and alternatives were explained to the patient. Questions regarding the procedure were encouraged and answered. The patient understands and consents to the procedure. The left flank was prepped with chlorhexidine in a sterile fashion, and a sterile drape was applied covering the operative field. A sterile gown and sterile gloves were used for the procedure. Local anesthesia was provided with 1% Lidocaine. The left flank was interrogated with ultrasound and the left kidney identified. The kidney is hydronephrotic. A suitable access site on the skin overlying the lower pole, posterior calix was identified. After local mg anesthesia was achieved, a small skin nick was made with an 11 blade scalpel. A 21 gauge Accustick needle was then advanced under direct sonographic guidance into the lower pole of the left kidney. A 0.018 inch wire was advanced under fluoroscopic guidance into the left renal collecting system. The Accustick sheath was then advanced over the wire and a 0.018 system exchanged for a 0.035 system. Gentle hand injection of contrast material confirms placement of the sheath within the renal collecting system. There is marked hydronephrosis. The tract from the scan into the  renal collecting system was then dilated serially to 10-French. A 10-French Cook all-purpose drain was then placed and positioned under fluoroscopic  guidance. The locking loop is well formed within the left renal pelvis. The catheter was secured to the skin with 2-0 Prolene and a sterile bandage was placed. Catheter was left to gravity bag drainage. IMPRESSION: Successful placement of a left 10 French percutaneous nephrostomy tube. PLAN: 1. Maintain tube to gravity bag drainage. 2. Continue to trend creatinine levels. 3. Return to interventional Radiology in 6 weeks for tube check and change. An attempt at internalization to a percutaneous nephroureteral tube, or an internal double-J ureteral stent could be considered at that time. Signed, Criselda Peaches, MD [facility] Radiology Electronically Signed   By: Jacqulynn Cadet M.D.   On: 09/20/2017 13:43    Assessment/Plan:  62 y.o. female w/ bilateral obstructing stones and urosepsis s/p right ureteral stent placement and left PCN placement.   - continue ceftriaxone until sensitivities from culture back back, course per primary team  - may remove foley catheter today if desired by primary team give patients fevers have defervesced for 24 hours on antibiotics  - continue left PCN to drainage. Patient will be discharged this this in place.  - BID flushing of nephrostomy tube while in hospital to ensure drainage while bloody - we will see patient in clinic in 2-3 weeks to discuss outpatient ureteroscopic treatment of stones. No additional intervention will take place from urologic perspective while patient admitted for stones    LOS: 2 days   Cambryn Charters L 09/21/2017, 7:39 AM

## 2017-09-22 DIAGNOSIS — N390 Urinary tract infection, site not specified: Secondary | ICD-10-CM

## 2017-09-22 DIAGNOSIS — I10 Essential (primary) hypertension: Secondary | ICD-10-CM

## 2017-09-22 DIAGNOSIS — N132 Hydronephrosis with renal and ureteral calculous obstruction: Secondary | ICD-10-CM

## 2017-09-22 DIAGNOSIS — N179 Acute kidney failure, unspecified: Secondary | ICD-10-CM

## 2017-09-22 DIAGNOSIS — R319 Hematuria, unspecified: Secondary | ICD-10-CM

## 2017-09-22 LAB — CBC
HCT: 36.1 % (ref 36.0–46.0)
HEMOGLOBIN: 11.9 g/dL — AB (ref 12.0–15.0)
MCH: 29.7 pg (ref 26.0–34.0)
MCHC: 33 g/dL (ref 30.0–36.0)
MCV: 90 fL (ref 78.0–100.0)
Platelets: 195 10*3/uL (ref 150–400)
RBC: 4.01 MIL/uL (ref 3.87–5.11)
RDW: 14.9 % (ref 11.5–15.5)
WBC: 15.9 10*3/uL — ABNORMAL HIGH (ref 4.0–10.5)

## 2017-09-22 LAB — BASIC METABOLIC PANEL
Anion gap: 19 — ABNORMAL HIGH (ref 5–15)
BUN: 113 mg/dL — ABNORMAL HIGH (ref 6–20)
CHLORIDE: 100 mmol/L — AB (ref 101–111)
CO2: 21 mmol/L — ABNORMAL LOW (ref 22–32)
CREATININE: 7.46 mg/dL — AB (ref 0.44–1.00)
Calcium: 8.4 mg/dL — ABNORMAL LOW (ref 8.9–10.3)
GFR calc Af Amer: 6 mL/min — ABNORMAL LOW (ref >60)
GFR calc non Af Amer: 5 mL/min — ABNORMAL LOW (ref >60)
Glucose, Bld: 151 mg/dL — ABNORMAL HIGH (ref 65–99)
Potassium: 3.6 mmol/L (ref 3.5–5.1)
SODIUM: 140 mmol/L (ref 135–145)

## 2017-09-22 LAB — MAGNESIUM: Magnesium: 1.8 mg/dL (ref 1.7–2.4)

## 2017-09-22 MED ORDER — SODIUM CHLORIDE 0.9 % IV SOLN
INTRAVENOUS | Status: DC
  Administered 2017-09-22 – 2017-09-26 (×5): via INTRAVENOUS

## 2017-09-22 MED ORDER — POLYETHYLENE GLYCOL 3350 17 G PO PACK
17.0000 g | PACK | Freq: Every day | ORAL | Status: DC | PRN
  Administered 2017-09-22 – 2017-09-25 (×2): 17 g via ORAL
  Filled 2017-09-22 (×2): qty 1

## 2017-09-22 NOTE — Care Management Important Message (Signed)
Important Message  Patient Details  Name: Sheila Leach MRN: [id number] Date of Birth: July 26, 1956   Medicare Important Message Given:  Yes    Kerin Salen 09/22/2017, 12:38 [place] Message  Patient Details  Name: Sheila Leach MRN: [id number] Date of Birth: [birth year]   Medicare Important Message Given:  Yes    Kerin Salen 09/22/2017, 12:38 PM

## 2017-09-22 NOTE — Evaluation (Signed)
Physical Therapy Evaluation Patient Details Name: Sheila Leach MRN: [id number] DOB: 1956/07/11 Today's Date: 09/22/2017   History of Present Illness  Sheila Leach is a 62 y.o. female with PMH of DJD, HTN, Asthma, Hypothyroidism, CKD, Nephrolithiasis, presented with nausea, vomiting, diarrhea associated with flank pains for several days. Pt now s/p bilateral obstructing stones and urosepsis s/p right ureteral stent placement and left PCN placement.   Clinical Impression  Pt admitted with above diagnosis. Pt currently with functional limitations due to the deficits listed below (see PT Problem List). Pt will benefit from skilled PT to increase their independence and safety with mobility to allow discharge to the venue listed below.  Pt MOD I with bed mobility and sit > stand.  HR up to 117 and o2 97% on RA with short distance gait.  Pt's gait limited by fatigue which she feels is due to medicines. She will have 24 hour A available at home.  Recommend HHPT.      Follow Up Recommendations Home health PT    Equipment Recommendations  None recommended by PT    Recommendations for Other Services       Precautions / Restrictions Precautions Precautions: Fall Restrictions Weight Bearing Restrictions: No      Mobility  Bed Mobility Overal bed mobility: Modified Independent                Transfers Overall transfer level: Modified independent Equipment used: Straight cane             General transfer comment: Pt stood from bed and toilet with MOD I.  Ambulation/Gait Ambulation/Gait assistance: Min guard Ambulation Distance (Feet): 15 Feet(x2) Assistive device: Straight cane Gait Pattern/deviations: Decreased step length - right;Decreased step length - left;Antalgic     General Gait Details: Pt HR 117 and o2 97% after gait.  She reports some difficulty with knee/foot. Pt reports feeling fatigued from medicine and could not ambulate farther.  Stairs             Wheelchair Mobility    Modified Rankin (Stroke Patients Only)       Balance Overall balance assessment: Needs assistance   Sitting balance-Sheila Leach Scale: Good       Standing balance-Sheila Leach Scale: Poor Standing balance comment: requires use of cane                             Pertinent Vitals/Pain Pain Assessment: 0-10 Pain Score: 5  Pain Location: chronic back pain Pain Descriptors / Indicators: Discomfort;Sharp Pain Intervention(s): Monitored during session;Limited activity within patient's tolerance;Repositioned    Home Living Family/patient expects to be discharged to:: Private residence Living Arrangements: Children Available Help at Discharge: Available PRN/intermittently(son works a few blocks from home, but friend will stay with her while he has gone) Type of Home: House Home Access: Stairs to enter Entrance Stairs-Rails: Left;Right;Can reach both Technical brewer of Steps: 4 Home Layout: One level Home Equipment: Cane - quad      Prior Function Level of Independence: Independent with assistive device(s)         Comments: AMb with quad cane in the community, but without AD at home     Hand Dominance   Dominant Hand: Right    Extremity/Trunk Assessment   Upper Extremity Assessment Upper Extremity Assessment: Overall WFL for tasks assessed;Generalized weakness    Lower Extremity Assessment Lower Extremity Assessment: Overall WFL for tasks assessed;Generalized weakness    Cervical / Trunk Assessment Cervical /  Trunk Assessment: Normal  Communication      Cognition Arousal/Alertness: Awake/alert Behavior During Therapy: WFL for tasks assessed/performed Overall Cognitive Status: Within Functional Limits for tasks assessed                                        General Comments General comments (skin integrity, edema, etc.): Pt limited by fatigue which she feels is due to meds.    Exercises      Assessment/Plan    PT Assessment Patient needs continued PT services  PT Problem List Decreased activity tolerance;Decreased balance;Decreased mobility;Decreased coordination;Decreased strength       PT Treatment Interventions DME instruction;Gait training;Stair training;Functional mobility training;Neuromuscular re-education;Balance training;Therapeutic exercise;Therapeutic activities    PT Goals (Current goals can be found in the Care Plan section)  Acute Rehab PT Goals Patient Stated Goal: feel better PT Goal Formulation: With patient Time For Goal Achievement: 10/06/17 Potential to Achieve Goals: Good    Frequency Min 3X/week   Barriers to discharge        Co-evaluation               AM-PAC PT "6 Clicks" Daily Activity  Outcome Measure Difficulty turning over in bed (including adjusting bedclothes, sheets and blankets)?: None Difficulty moving from lying on back to sitting on the side of the bed? : None Difficulty sitting down on and standing up from a chair with arms (e.g., wheelchair, bedside commode, etc,.)?: A Little Help needed moving to and from a bed to chair (including a wheelchair)?: A Little Help needed walking in hospital room?: A Little Help needed climbing 3-5 steps with a railing? : A Little 6 Click Score: 20    End of Session   Activity Tolerance: Patient tolerated treatment well Patient left: in chair;with chair alarm set;with call bell/phone within reach Nurse Communication: Mobility status PT Visit Diagnosis: Muscle weakness (generalized) (M62.81);Difficulty in walking, not elsewhere classified (R26.2)    Time: 3846-6599 PT Time Calculation (min) (ACUTE ONLY): 26 min   Charges:   PT Evaluation $PT Eval Moderate Complexity: 1 Mod PT Treatments $Gait Training: 8-22 mins   PT G Codes:        Sheila Leach, [place] Pager [phone] 09/22/2017   Sheila Leach 09/22/2017, 12:26 PM

## 2017-09-22 NOTE — Progress Notes (Signed)
[facility]  PROGRESS NOTE  Sheila Leach HLK:[id number] DOB: 01-Dec-1955 DOA: 09/19/2017 PCP: Shon Baton, MD   Brief HPI:   62 y.o.femalewith PMH of DJD, HTN, Asthma, Hypothyroidism, CKD, Nephrolithiasis, presented with nausea, vomiting, diarrhea associated with flank pains for several days. She has developed worsening back pains with hematuria last night and presented to emergency department for further evaluation. No hematemesis, no hematochezia, no acute abdominal pains but had mild lower abdominal discomfort , no acute chest pains or shortness of breath. She had mild productive cough, no wheezing. He reports some chills no fevers. She reports taking nsaids for few days   ED: CT abd showed hydronephrosis, creatinine 8.7, previously at 1.4. ED d/w urology, S/p right ureteral stent placement 2/17, L PCN placement     Subjective   Patient seen and examined, denies abdominal pain   Assessment/Plan:     1. 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 Patient underwent cystoscopy with right sided JJs stent placement on September 19, 2017, along with left sided percutaneous nephrostomy  placement  by IR on September 20, 2017. Renal function is slowly improving. Called and discussed with nephrologist Dr. Benjamine Sprague, who recommends to continue IV fluids and monitoring renal function until creatinine improves around 4.0 2. UTI/pyelonephritis-urine culture obtained on September 18, 2017 grew Klebsiella pneumoniae, sensitive to ceftriaxone. Continue ceftriaxone 3. Acute kidney injury-patient presented with creatinine of 8.7, baseline creatinine around 1.4. Secondary to obstructive neuropathy as above. Currently on gentle IV hydration with normal saline at 50 ML per hour. Creatinine is slowly  improving, today creatinine is 7.46. Continue sodium bicarbonate tablets three times a day 4. chronic asthma- no signs and symptoms of exacerbation. Continue PRN bronchodilators. 5. Hypothyroidism-TSH was elevated and T3 was low, Synthroid dose increased to 200 g daily. Repeat TSH along with T-3 and free T 4 in  4 to 6 weeks as outpatient 6. Hypertension-lisinopril currently on hold due to above. Blood pressure is stable. 7. Nausea vomiting and diarrhea-the setting of UTI, kidney stones. Improved with supportive care. 8. Dyspnea-patient developed mild fluid overload on February 1919, required IV Lasix, breathing has improved. Echocardiogram showed ejection fraction in the range of 50 to 55%. 9.     DVT prophylaxis: heparin  Code Status: full code  Family Communication: no family at bedside  Disposition Plan: likely home  With home health.   Consultants:  Urology  IR  Procedures:   Nephrostomy tube placement by IR.  On 09/20/2017  Cystoscopy with right-sided double-J stent placement on 09/19/2017 by urology    Continuous infusions . cefTRIAXone (ROCEPHIN)  IV Stopped (09/21/17 2202)      Antibiotics:   Anti-infectives (From admission, onward)   Start     Dose/Rate Route Frequency Ordered Stop   09/20/17 1230  ceFAZolin (ANCEF) IVPB 2g/100 mL premix     2 g 200 mL/hr over 30 Minutes Intravenous  Once 09/20/17 1229 09/20/17 1524   09/20/17 0300  cefTRIAXone (ROCEPHIN) 1 g in sodium chloride 0.9 % 100 mL IVPB  Status:  Discontinued     1 g 200 mL/hr over 30 Minutes Intravenous Every 24 hours 09/19/17 0813 09/19/17 2311   09/19/17 2315  cefTRIAXone (ROCEPHIN) 2 g in sodium chloride 0.9 % 100 mL IVPB     2 g 200 mL/hr over 30 Minutes Intravenous Daily at bedtime  09/19/17 2311     09/19/17 2200  cefTRIAXone (ROCEPHIN) 2 g in sodium chloride 0.9 % 100 mL IVPB  Status:  Discontinued     2 g 200 mL/hr over 30 Minutes Intravenous Every 24 hours 09/19/17 0151 09/19/17 0813    09/19/17 0130  cefTRIAXone (ROCEPHIN) 2 g in sodium chloride 0.9 % 100 mL IVPB     2 g 200 mL/hr over 30 Minutes Intravenous  Once 09/19/17 0129 09/19/17 0344       Objective   Vitals:   09/21/17 2157 09/22/17 0630 09/22/17 0934 09/22/17 1351  BP: 127/77 130/82  137/89  Pulse: (!) 106 (!) 103  (!) 105  Resp: 20 20  20   Temp: 97.7 F (36.5 C) (!) 97.5 F (36.4 C)  98 F (36.7 C)  TempSrc: Oral Oral  Oral  SpO2: 94% 93% 93% 99%  Weight:      Height:        Intake/Output Summary (Last 24 hours) at 09/22/2017 1450 Last data filed at 09/22/2017 1300 Gross per 24 hour  Intake 1250 ml  Output 1475 ml  Net -225 ml   Filed Weights   09/18/17 2211  Weight: 127.9 kg (282 lb)     Physical Examination:   Physical Exam: Eyes: No icterus, extraocular muscles intact  Mouth: Oral mucosa is moist, no lesions on palate,  Neck: Supple, no deformities, masses, or tenderness Lungs: Normal respiratory effort, bilateral clear to auscultation, no crackles or wheezes.  Heart: Regular rate and rhythm, S1 and S2 normal, no murmurs, rubs auscultated Abdomen: BS normoactive,soft,nondistended,non-tender to palpation,no organomegaly. Left PCN tube in place Extremities: No pretibial edema, no erythema, no cyanosis, no clubbing Neuro : Alert and oriented to time, place and person, No focal deficits  Skin: No rashes seen on exam     Data Reviewed: I have personally reviewed following labs and imaging studies  CBG: No results for input(s): GLUCAP in the last 168 hours.  CBC: Recent Labs  Lab 09/18/17 2349 09/20/17 0555 09/21/17 0514 09/22/17 0615  WBC 14.9* 9.6 12.4* 15.9*  HGB 11.8* 10.7* 11.5* 11.9*  HCT 34.6* 33.4* 35.8* 36.1  MCV 88.3 92.3 91.8 90.0  PLT 177 166 163 696    Basic Metabolic Panel: Recent Labs  Lab 09/18/17 2349 09/20/17 0555 09/21/17 0514 09/22/17 0615  NA 133* 139 139 140  K 4.5 5.1 4.7 3.6  CL 101 107 106 100*  CO2 14* 14* 15* 21*  GLUCOSE 144* 216*  169* 151*  BUN 113* 119* 107* 113*  CREATININE 8.97* 8.65* 8.20* 7.46*  CALCIUM 9.4 8.3* 8.2* 8.4*  MG  --   --   --  1.8    Recent Results (from the past 240 hour(s))  Culture, Urine     Status: Abnormal   Collection Time: 09/18/17 11:30 PM  Result Value Ref Range Status   Specimen Description   Final    URINE, CLEAN CATCH Performed at [facility] [address], [place], [place] [address]    Special Requests   Final    catheterize if needed Performed at [facility], [place] [address], [place], [place] [address]    Culture >=100,000 COLONIES/mL KLEBSIELLA PNEUMONIAE (A)  Final   Report Status 09/21/2017 FINAL  Final   Organism ID, Bacteria KLEBSIELLA PNEUMONIAE (A)  Final      Susceptibility   Klebsiella pneumoniae - MIC*    AMPICILLIN >=32 RESISTANT Resistant     CEFAZOLIN <=4 SENSITIVE Sensitive  CEFTRIAXONE <=1 SENSITIVE Sensitive     CIPROFLOXACIN >=4 RESISTANT Resistant     GENTAMICIN <=1 SENSITIVE Sensitive     IMIPENEM <=0.25 SENSITIVE Sensitive     NITROFURANTOIN 128 RESISTANT Resistant     TRIMETH/SULFA >=320 RESISTANT Resistant     AMPICILLIN/SULBACTAM 4 SENSITIVE Sensitive     PIP/TAZO <=4 SENSITIVE Sensitive     Extended ESBL NEGATIVE Sensitive     * >=100,000 COLONIES/mL KLEBSIELLA PNEUMONIAE  Blood Culture (routine x 2)     Status: Abnormal   Collection Time: 09/19/17  1:35 AM  Result Value Ref Range Status   Specimen Description   Final    BLOOD RIGHT ANTECUBITAL Performed at [facility] [address], [place], [place] [address]    Special Requests   Final    BOTTLES DRAWN AEROBIC AND ANAEROBIC Blood Culture adequate volume Performed at [facility] [address], [place], [place] [address]    Culture  Setup Time   Final    IN BOTH AEROBIC AND ANAEROBIC BOTTLES CRITICAL VALUE NOTED.  VALUE IS CONSISTENT WITH PREVIOUSLY REPORTED AND CALLED VALUE. GRAM NEGATIVE RODS     Culture (A)  Final    KLEBSIELLA PNEUMONIAE SUSCEPTIBILITIES PERFORMED ON PREVIOUS CULTURE WITHIN THE LAST 5 DAYS. Performed at [facility], [place] [address] [place], [place] [address]    Report Status 09/21/2017 FINAL  Final  Blood Culture (routine x 2)     Status: Abnormal   Collection Time: 09/19/17  1:35 AM  Result Value Ref Range Status   Specimen Description   Final    BLOOD LEFT ANTECUBITAL Performed at [facility] [address] [place], [place] [address]    Special Requests   Final    BOTTLES DRAWN AEROBIC AND ANAEROBIC Blood Culture adequate volume Performed at [facility] [address] [place], [place] [address]    Culture  Setup Time   Final    GRAM NEGATIVE RODS IN BOTH AEROBIC AND ANAEROBIC BOTTLES CRITICAL RESULT CALLED TO, READ BACK BY AND VERIFIED WITH: Lavell Luster [facility] 6967 09/19/17 HMILES Performed at [facility], [place] [address] [place], [place] [address]    Culture KLEBSIELLA PNEUMONIAE (A)  Final   Report Status 09/21/2017 FINAL  Final   Organism ID, Bacteria KLEBSIELLA PNEUMONIAE  Final      Susceptibility   Klebsiella pneumoniae - MIC*    AMPICILLIN >=32 RESISTANT Resistant     CEFAZOLIN <=4 SENSITIVE Sensitive     CEFEPIME <=1 SENSITIVE Sensitive     CEFTAZIDIME <=1 SENSITIVE Sensitive     CEFTRIAXONE <=1 SENSITIVE Sensitive     CIPROFLOXACIN >=4 RESISTANT Resistant     GENTAMICIN <=1 SENSITIVE Sensitive     IMIPENEM <=0.25 SENSITIVE Sensitive     TRIMETH/SULFA >=320 RESISTANT Resistant     AMPICILLIN/SULBACTAM 4 SENSITIVE Sensitive     PIP/TAZO <=4 SENSITIVE Sensitive     Extended ESBL NEGATIVE Sensitive     * KLEBSIELLA PNEUMONIAE  Blood Culture ID Panel (Reflexed)     Status: Abnormal   Collection Time: 09/19/17  1:35 AM  Result Value Ref Range Status   Enterococcus species NOT DETECTED NOT DETECTED Final   Listeria monocytogenes NOT DETECTED NOT DETECTED Final   Staphylococcus species  NOT DETECTED NOT DETECTED Final   Staphylococcus aureus NOT DETECTED NOT DETECTED Final   Streptococcus species NOT DETECTED NOT DETECTED Final   Streptococcus agalactiae NOT DETECTED NOT DETECTED Final  Streptococcus pneumoniae NOT DETECTED NOT DETECTED Final   Streptococcus pyogenes NOT DETECTED NOT DETECTED Final   Acinetobacter baumannii NOT DETECTED NOT DETECTED Final   Enterobacteriaceae species DETECTED (A) NOT DETECTED Final    Comment: Enterobacteriaceae represent a large family of gram-negative bacteria, not a single organism. CRITICAL RESULT CALLED TO, READ BACK BY AND VERIFIED WITH: Lavell Luster [facility] 2309 09/19/17 HMILES    Enterobacter cloacae complex NOT DETECTED NOT DETECTED Final   Escherichia coli NOT DETECTED NOT DETECTED Final   Klebsiella oxytoca NOT DETECTED NOT DETECTED Final   Klebsiella pneumoniae DETECTED (A) NOT DETECTED Final    Comment: CRITICAL RESULT CALLED TO, READ BACK BY AND VERIFIED WITH: J.GRIMSLEY PHARMD 2309 09/19/17 HMILES    Proteus species NOT DETECTED NOT DETECTED Final   Serratia marcescens NOT DETECTED NOT DETECTED Final   Carbapenem resistance NOT DETECTED NOT DETECTED Final   Haemophilus influenzae NOT DETECTED NOT DETECTED Final   Neisseria meningitidis NOT DETECTED NOT DETECTED Final   Pseudomonas aeruginosa NOT DETECTED NOT DETECTED Final   Candida albicans NOT DETECTED NOT DETECTED Final   Candida glabrata NOT DETECTED NOT DETECTED Final   Candida krusei NOT DETECTED NOT DETECTED Final   Candida parapsilosis NOT DETECTED NOT DETECTED Final   Candida tropicalis NOT DETECTED NOT DETECTED Final    Comment: Performed at [facility], [place]. [address] [place], [place] [address]  Urine Culture     Status: Abnormal   Collection Time: 09/19/17 10:17 AM  Result Value Ref Range Status   Specimen Description   Final    URINE, CATHETERIZED CYSTO Performed at [facility] [address], [place], [place] [address]     Special Requests   Final    NONE Performed at [facility], [place] [address], [place], [place] [address]    Culture (A)  Final    <10,000 COLONIES/mL INSIGNIFICANT GROWTH Performed at [facility] [address], [place],  [address]    Report Status 09/21/2017 FINAL  Final     Liver Function Tests: Recent Labs  Lab 09/18/17 2349  AST 31  ALT 50  ALKPHOS 120  BILITOT 0.8  PROT 6.7  ALBUMIN 2.7*   Recent Labs  Lab 09/18/17 2349  LIPASE 21   No results for input(s): AMMONIA in the last 168 hours.  Cardiac Enzymes: No results for input(s): CKTOTAL, CKMB, CKMBINDEX, TROPONINI in the last 168 hours. BNP (last 3 results) Recent Labs    09/19/17 1316  BNP 251.6*    ProBNP (last 3 results) No results for input(s): PROBNP in the last 8760 hours.    Studies: Dg Chest Port 1 View  Result Date: 09/21/2017 CLINICAL DATA:  Followup shortness of Breath EXAM: PORTABLE CHEST 1 VIEW COMPARISON:  09/19/2017 FINDINGS: Slight improvement in aeration. Mild residual right base atelectasis. No focal opacity on the left. Heart is mildly enlarged. Suspect small layering right effusion. IMPRESSION: Small right effusion with right base atelectasis. Some improvement in aeration since prior study. Electronically Signed   By: Rolm Baptise M.D.   On: 09/21/2017 09:46    Scheduled Meds: . heparin injection (subcutaneous)  5,000 Units Subcutaneous Q8H  . ipratropium-albuterol  3 mL Nebulization TID  . levothyroxine  200 mcg Oral QAC breakfast  . loratadine  10 mg Oral Daily  . sodium bicarbonate  650 mg Oral TID  . vitamin C  1,000 mg Oral Daily  . Vitamin D (Ergocalciferol)  50,000 Units Oral Q  Sun      Time spent: 20 min  [facility] Pager [phone]. If 7PM-7AM, please contact night-coverage at [email or web address], Office  [phone]  password [email or web address]  09/22/2017, 2:50 PM  LOS: 3 days

## 2017-09-22 NOTE — Progress Notes (Signed)
Referring Physician(s): McKenzie,P  Supervising Physician: Marybelle Killings  Patient Status:  [facility] - In-pt  Chief Complaint:  Renal stones, left hydronephrosis  Subjective:  Pt denies any new c/o  Allergies: Band-aid plus antibiotic [bacitracin-polymyxin b] and Iodine  Medications: Prior to Admission medications   Medication Sig Start Date End Date Taking? Authorizing Provider  acetaminophen (TYLENOL) 325 MG tablet Take 650 mg by mouth every 6 (six) hours as needed for mild pain.    Yes [provider]  albuterol (PROVENTIL HFA;VENTOLIN HFA) 108 (90 BASE) MCG/ACT inhaler Inhale 2 puffs into the lungs every 6 (six) hours as needed for wheezing or shortness of breath.   Yes [provider]  Ascorbic Acid (VITAMIN C) 1000 MG tablet Take 1,000 mg by mouth daily.    Yes [provider]  cetirizine (ZYRTEC) 10 MG tablet Take 10 mg by mouth daily.   Yes [provider]  ergocalciferol (VITAMIN D2) 50000 UNITS capsule Take 50,000 Units by mouth every Sunday.    Yes [provider]  fenofibrate 160 MG tablet Take 160 mg by mouth daily. 09/15/17  Yes [provider]  ibuprofen (ADVIL,MOTRIN) 200 MG tablet Take 400 mg by mouth every 6 (six) hours as needed for headache, mild pain or moderate pain.    Yes [provider]  levothyroxine (SYNTHROID, LEVOTHROID) 175 MCG tablet Take 175 mcg by mouth daily before breakfast.   Yes [provider]  lisinopril (PRINIVIL,ZESTRIL) 20 MG tablet Take 40 mg by mouth daily.    Yes [provider]  Multiple Vitamins-Minerals (MULTIVITAMIN WITH MINERALS) tablet Take 1 tablet by mouth daily.   Yes [provider]     Vital Signs: BP 130/82 (BP Location: Right Arm)   Pulse (!) 103   Temp (!) 97.5 F (36.4 C) (Oral)   Resp 20   Ht 5\' 8"  (1.727 m)   Wt 282 lb (127.9 kg)   SpO2 93%   BMI 42.88 kg/m   Physical Exam left PCN intact, dressing dry, not sig tender,  output over 700 cc blood tinged urine today  Imaging: Ct Abdomen Pelvis Wo Contrast  Result Date: 09/19/2017 CLINICAL DATA:  Abdominal pain and hematuria. EXAM: CT ABDOMEN AND PELVIS WITHOUT CONTRAST TECHNIQUE: Multidetector CT imaging of the abdomen and pelvis was performed following the standard protocol without IV contrast. COMPARISON:  CT 09/25/2014 FINDINGS: Lower chest: Upper normal heart size. Bilateral lower lobe atelectasis, breathing motion artifact. Hepatobiliary: The liver is enlarged spanning 22.5 cm with diffuse steatosis. No evidence of focal lesion allowing for lack contrast. Possible sludge in the gallbladder which is physiologically distended. No calcified stone. No biliary dilatation. Pancreas: No ductal dilatation or inflammation. Spleen: Enlarged measuring 16.3 cm AP. Adrenals/Urinary Tract: No adrenal nodule. Obstructing 7 mm stone in the distal right ureter just proximal to the ureterovesicular junction with mild right hydroureteronephrosis and perinephric edema. Two additional nonobstructing stones in the upper right kidney. Probable cyst in the mid right kidney. Chronic 15 mm stone at the left ureteropelvic junction with chronic severe hydronephrosis. Progressive parenchymal thinning from prior exam. No left perinephric edema. Nonobstructing stone versus parenchymal calcification in the mid left kidney. Urinary bladder is completely decompressed. Stomach/Bowel: Multifocal colonic diverticulosis without diverticulitis. Normal appendix. No bowel obstruction, inflammation or wall thickening. Vascular/Lymphatic: Aortic atherosclerosis. Retroaortic left renal vein. Increased number of multiple small retroperitoneal lymph nodes. Largest node measures 11 mm short axis in the pericaval region at the level of the right kidney. Small periportal  nodes are likely reactive. Reproductive: Status post hysterectomy. No adnexal masses. Other: No free air, free fluid, or intra-abdominal fluid collection.  Musculoskeletal: Multilevel degenerative change throughout the lumbar spine. There are no acute or suspicious osseous abnormalities. IMPRESSION: 1. Obstructing 7 mm stone in the distal right ureter just proximal to the ureteropelvic junction with mild hydroureteronephrosis and perinephric edema. 2. 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 3. Additional nonobstructing right renal calculi. 4. Hepatosplenomegaly and hepatic steatosis. 5. Colonic diverticulosis without diverticulitis. 6.  Aortic Atherosclerosis (ICD10-I70.0). Electronically Signed   By: Jeb Levering M.D.   On: 09/19/2017 05:42   Dg Chest 2 View  Result Date: 09/19/2017 CLINICAL DATA:  Nausea. EXAM: CHEST  2 VIEW COMPARISON:  06/24/2014 FINDINGS: Very low lung volumes on the AP view limit assessment. The heart is enlarged. Bronchovascular crowding with peribronchial cuffing. No confluent airspace disease. No large pleural effusion. No pneumothorax. Body habitus limits detailed assessment. Chronic change about the left shoulder. IMPRESSION: Cardiomegaly. Peribronchial cuffing favored to be bronchial inflammation over pulmonary edema. Electronically Signed   By: Jeb Levering M.D.   On: 09/19/2017 02:57   Dg Chest Port 1 View  Result Date: 09/21/2017 CLINICAL DATA:  Followup shortness of Breath EXAM: PORTABLE CHEST 1 VIEW COMPARISON:  09/19/2017 FINDINGS: Slight improvement in aeration. Mild residual right base atelectasis. No focal opacity on the left. Heart is mildly enlarged. Suspect small layering right effusion. IMPRESSION: Small right effusion with right base atelectasis. Some improvement in aeration since prior study. Electronically Signed   By: Rolm Baptise M.D.   On: 09/21/2017 09:46   Dg C-arm 1-60 Min-no Report  Result Date: 09/19/2017 Fluoroscopy was utilized by the requesting physician.  No radiographic interpretation.    Ir Nephrostomy Placement Left  Result Date: 09/20/2017 INDICATION: 62 year old female with bilateral obstructed hydronephrosis secondary to stone disease. Double-J stent placed successfully on the right yesterday. Unable to place internal stent on the left. Patient presents for percutaneous nephrostomy placement. EXAM: IR NEPHROSTOMY PLACEMENT LEFT COMPARISON:  CT scan of the abdomen and pelvis 09/19/2017 MEDICATIONS: 2 g Ancef; The antibiotic was administered in an appropriate time frame prior to skin puncture. ANESTHESIA/SEDATION: Fentanyl 1 mcg IV; Versed 50 mg IV Moderate Sedation Time:  10 minutes The patient was continuously monitored during the procedure by the interventional radiology nurse under my direct supervision. CONTRAST:  15 mL Isovue 370-administered into the collecting system(s) FLUOROSCOPY TIME:  Fluoroscopy Time: 0 minutes 30 seconds (31 mGy). COMPLICATIONS: None immediate. TECHNIQUE: The procedure, risks, benefits, and alternatives were explained to the patient. Questions regarding the procedure were encouraged and answered. The patient understands and consents to the procedure. The left flank was prepped with chlorhexidine in a sterile fashion, and a sterile drape was applied covering the operative field. A sterile gown and sterile gloves were used for the procedure. Local anesthesia was provided with 1% Lidocaine. The left flank was interrogated with ultrasound and the left kidney identified. The kidney is hydronephrotic. A suitable access site on the skin overlying the lower pole, posterior calix was identified. After local mg anesthesia was achieved, a small skin nick was made with an 11 blade scalpel. A 21 gauge Accustick needle was then advanced under direct sonographic guidance into the lower pole of the left kidney. A 0.018 inch wire was advanced under fluoroscopic guidance into the left renal collecting system. The Accustick sheath was then advanced over  the wire and a 0.018  system exchanged for a 0.035 system. Gentle hand injection of contrast material confirms placement of the sheath within the renal collecting system. There is marked hydronephrosis. The tract from the scan into the renal collecting system was then dilated serially to 10-French. A 10-French Cook all-purpose drain was then placed and positioned under fluoroscopic guidance. The locking loop is well formed within the left renal pelvis. The catheter was secured to the skin with 2-0 Prolene and a sterile bandage was placed. Catheter was left to gravity bag drainage. IMPRESSION: Successful placement of a left 10 French percutaneous nephrostomy tube. PLAN: 1. Maintain tube to gravity bag drainage. 2. Continue to trend creatinine levels. 3. Return to interventional Radiology in 6 weeks for tube check and change. An attempt at internalization to a percutaneous nephroureteral tube, or an internal double-J ureteral stent could be considered at that time. Signed, Criselda Peaches, MD [facility] Electronically Signed   By: Jacqulynn Cadet M.D.   On: 09/20/2017 13:43    Labs:  CBC: Recent Labs    09/18/17 2349 09/20/17 0555 09/21/17 0514 09/22/17 0615  WBC 14.9* 9.6 12.4* 15.9*  HGB 11.8* 10.7* 11.5* 11.9*  HCT 34.6* 33.4* 35.8* 36.1  PLT 177 166 163 195    COAGS: Recent Labs    09/19/17 1316  INR 1.08    BMP: Recent Labs    09/18/17 2349 09/20/17 0555 09/21/17 0514 09/22/17 0615  NA 133* 139 139 140  K 4.5 5.1 4.7 3.6  CL 101 107 106 100*  CO2 14* 14* 15* 21*  GLUCOSE 144* 216* 169* 151*  BUN 113* 119* 107* 113*  CALCIUM 9.4 8.3* 8.2* 8.4*  CREATININE 8.97* 8.65* 8.20* 7.46*  GFRNONAA 4* 4* 5* 5*  GFRAA 5* 5* 5* 6*    LIVER FUNCTION TESTS: Recent Labs    09/18/17 2349  BILITOT 0.8  AST 31  ALT 50  ALKPHOS 120  PROT 6.7  ALBUMIN 2.7*    Assessment and Plan: Pt with hx nephrolithiasis, obstructive bilat  hydronephrosis; s/p rt JJ by urology 2/17, left PCN in IR 2/18; afebrile; creat 7.46(8.2), WBC 15.9(12.4), hgb stable; plans as per urology; will check PCN urine cx as well    Electronically Signed: D. Rowe Robert, PA-C 09/22/2017, 1:48 PM   I spent a total of 15 minutes at the the patient's bedside AND on the patient's hospital floor or unit, greater than 50% of which was counseling/coordinating care for left nephrostomy    Patient ID: Sheila Leach, female   DOB: 03/05/1956, 62 y.o.   MRN: [id number]

## 2017-09-23 LAB — BASIC METABOLIC PANEL
Anion gap: 19 — ABNORMAL HIGH (ref 5–15)
BUN: 126 mg/dL — ABNORMAL HIGH (ref 6–20)
CALCIUM: 8.7 mg/dL — AB (ref 8.9–10.3)
CO2: 24 mmol/L (ref 22–32)
CREATININE: 6.36 mg/dL — AB (ref 0.44–1.00)
Chloride: 99 mmol/L — ABNORMAL LOW (ref 101–111)
GFR calc Af Amer: 7 mL/min — ABNORMAL LOW (ref >60)
GFR, EST NON AFRICAN AMERICAN: 6 mL/min — AB (ref >60)
GLUCOSE: 158 mg/dL — AB (ref 65–99)
Potassium: 3.3 mmol/L — ABNORMAL LOW (ref 3.5–5.1)
Sodium: 142 mmol/L (ref 135–145)

## 2017-09-23 LAB — CBC
HCT: 35.4 % — ABNORMAL LOW (ref 36.0–46.0)
Hemoglobin: 11.4 g/dL — ABNORMAL LOW (ref 12.0–15.0)
MCH: 29.4 pg (ref 26.0–34.0)
MCHC: 32.2 g/dL (ref 30.0–36.0)
MCV: 91.2 fL (ref 78.0–100.0)
PLATELETS: 188 10*3/uL (ref 150–400)
RBC: 3.88 MIL/uL (ref 3.87–5.11)
RDW: 14.6 % (ref 11.5–15.5)
WBC: 14.9 10*3/uL — ABNORMAL HIGH (ref 4.0–10.5)

## 2017-09-23 MED ORDER — IPRATROPIUM-ALBUTEROL 0.5-2.5 (3) MG/3ML IN SOLN
3.0000 mL | Freq: Two times a day (BID) | RESPIRATORY_TRACT | Status: DC
  Administered 2017-09-23 – 2017-09-25 (×4): 3 mL via RESPIRATORY_TRACT
  Filled 2017-09-23 (×4): qty 3

## 2017-09-23 MED ORDER — POTASSIUM CHLORIDE CRYS ER 20 MEQ PO TBCR
20.0000 meq | EXTENDED_RELEASE_TABLET | Freq: Once | ORAL | Status: AC
  Administered 2017-09-23: 20 meq via ORAL
  Filled 2017-09-23: qty 1

## 2017-09-23 NOTE — Progress Notes (Signed)
[facility]  PROGRESS NOTE  Sheila Leach EHU:[id number] DOB: June 04, 1956 DOA: 09/19/2017 PCP: Shon Baton, MD   Brief HPI:   62 y.o.femalewith PMH of DJD, HTN, Asthma, Hypothyroidism, CKD, Nephrolithiasis, presented with nausea, vomiting, diarrhea associated with flank pains for several days. She has developed worsening back pains with hematuria last night and presented to emergency department for further evaluation. No hematemesis, no hematochezia, no acute abdominal pains but had mild lower abdominal discomfort , no acute chest pains or shortness of breath. She had mild productive cough, no wheezing. He reports some chills no fevers. She reports taking nsaids for few days   ED: CT abd showed hydronephrosis, creatinine 8.7, previously at 1.4. ED d/w urology, S/p right ureteral stent placement 2/17, L PCN placement     Subjective   Patient seen and examined, developed epistaxis last night. Heparin has been discontinued.   Assessment/Plan:     1. 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 Patient underwent cystoscopy with right sided JJs stent placement on September 19, 2017, along with left sided percutaneous nephrostomy  placement  by IR on September 20, 2017. Renal function is slowly improving. Called and discussed with nephrologist Dr. Benjamine Sprague, who recommends to continue IV fluids and monitoring renal function until creatinine improves around 4.0. Today begin creatinine is 126/6.36 2. Epistaxis-resolved, patient developed [place] last night. Heparin has been discontinued. BUN did go up a little due to swallowing of blood. 3. UTI/pyelonephritis-urine culture obtained on September 18, 2017 grew Klebsiella pneumoniae, sensitive to ceftriaxone. Continue ceftriaxone. 4. Acute kidney  injury-patient presented with creatinine of 8.7, baseline creatinine around 1.4. Secondary to obstructive neuropathy as above. Currently on gentle IV hydration with normal saline at 50 ML per hour. Creatinine is slowly improving, today creatinine is 6.36 . Continue sodium bicarbonate tablets three times a day 5. Chronic asthma- no signs and symptoms of exacerbation. Continue PRN bronchodilators. 6. Hypothyroidism-TSH was elevated and T3 was low, Synthroid dose increased to 200 g daily. Repeat TSH along with T-3 and free T 4 in  4 to 6 weeks as outpatient 7. Hypertension-lisinopril currently on hold due to above. Blood pressure is stable. 8. Nausea vomiting and diarrhea-the setting of UTI, kidney stones. Improved with supportive care. 9. Dyspnea-patient developed mild fluid overload on February 1919, required IV Lasix, breathing has improved. Echocardiogram showed ejection fraction in the range of 50 to 55%.     DVT prophylaxis: heparin  Code Status: full code  Family Communication: no family at bedside  Disposition Plan: likely home  With home health.   Consultants:  Urology  IR  Procedures:   Nephrostomy tube placement by IR.  On 09/20/2017  Cystoscopy with right-sided double-J stent placement on 09/19/2017 by urology    Continuous infusions . sodium chloride 50 mL/hr at 09/22/17 1629  . cefTRIAXone (ROCEPHIN)  IV Stopped (09/22/17 2142)      Antibiotics:   Anti-infectives (From admission, onward)   Start     Dose/Rate Route Frequency Ordered Stop   09/20/17 1230  ceFAZolin (ANCEF) IVPB 2g/100 mL premix     2 g 200 mL/hr over 30 Minutes Intravenous  Once 09/20/17 1229 09/20/17 1524   09/20/17 0300  cefTRIAXone (ROCEPHIN) 1 g in sodium chloride 0.9 % 100 mL IVPB  Status:  Discontinued     1 g 200 mL/hr over 30  Minutes Intravenous Every 24 hours 09/19/17 0813 09/19/17 2311   09/19/17 2315  cefTRIAXone (ROCEPHIN) 2 g in sodium chloride 0.9 % 100 mL IVPB     2 g 200  mL/hr over 30 Minutes Intravenous Daily at bedtime 09/19/17 2311     09/19/17 2200  cefTRIAXone (ROCEPHIN) 2 g in sodium chloride 0.9 % 100 mL IVPB  Status:  Discontinued     2 g 200 mL/hr over 30 Minutes Intravenous Every 24 hours 09/19/17 0151 09/19/17 0813   09/19/17 0130  cefTRIAXone (ROCEPHIN) 2 g in sodium chloride 0.9 % 100 mL IVPB     2 g 200 mL/hr over 30 Minutes Intravenous  Once 09/19/17 0129 09/19/17 0344       Objective   Vitals:   09/22/17 2050 09/22/17 2116 09/23/17 0442 09/23/17 0730  BP:  131/80 133/78   Pulse:  98 98   Resp:  20 (!) 21   Temp:  (!) 97.5 F (36.4 C) 98.2 F (36.8 C)   TempSrc:  Oral Oral   SpO2: 94% 96% 100% 93%  Weight:      Height:        Intake/Output Summary (Last 24 hours) at 09/23/2017 1215 Last data filed at 09/23/2017 0442 Gross per 24 hour  Intake 865.83 ml  Output 975 ml  Net -109.17 ml   Filed Weights   09/18/17 2211  Weight: 127.9 kg (282 lb)     Physical Examination:   Physical Exam: Eyes: No icterus, extraocular muscles intact  Mouth: Oral mucosa is moist, no lesions on palate,  Neck: Supple, no deformities, masses, or tenderness Lungs: Normal respiratory effort, bilateral clear to auscultation, no crackles or wheezes.  Heart: Regular rate and rhythm, S1 and S2 normal, no murmurs, rubs auscultated Abdomen: BS normoactive,soft,nondistended,non-tender to palpation,no organomegaly Extremities: No pretibial edema, no erythema, no cyanosis, no clubbing Neuro : Alert and oriented to time, place and person, No focal deficits  Skin: No rashes seen on exam     Data Reviewed: I have personally reviewed following labs and imaging studies  CBG: No results for input(s): GLUCAP in the last 168 hours.  CBC: Recent Labs  Lab 09/18/17 2349 09/20/17 0555 09/21/17 0514 09/22/17 0615 09/23/17 0605  WBC 14.9* 9.6 12.4* 15.9* 14.9*  HGB 11.8* 10.7* 11.5* 11.9* 11.4*  HCT 34.6* 33.4* 35.8* 36.1 35.4*  MCV 88.3 92.3 91.8  90.0 91.2  PLT 177 166 163 195 811    Basic Metabolic Panel: Recent Labs  Lab 09/18/17 2349 09/20/17 0555 09/21/17 0514 09/22/17 0615 09/23/17 0605  NA 133* 139 139 140 142  K 4.5 5.1 4.7 3.6 3.3*  CL 101 107 106 100* 99*  CO2 14* 14* 15* 21* 24  GLUCOSE 144* 216* 169* 151* 158*  BUN 113* 119* 107* 113* 126*  CREATININE 8.97* 8.65* 8.20* 7.46* 6.36*  CALCIUM 9.4 8.3* 8.2* 8.4* 8.7*  MG  --   --   --  1.8  --     Recent Results (from the past 240 hour(s))  Culture, Urine     Status: Abnormal   Collection Time: 09/18/17 11:30 PM  Result Value Ref Range Status   Specimen Description   Final    URINE, CLEAN CATCH Performed at [facility], [place] [address], [place], [place] [address]    Special Requests   Final    catheterize if needed Performed at [facility], [place] [address] [place], [place] [address]    Culture >=100,000  COLONIES/mL KLEBSIELLA PNEUMONIAE (A)  Final   Report Status 09/21/2017 FINAL  Final   Organism ID, Bacteria KLEBSIELLA PNEUMONIAE (A)  Final      Susceptibility   Klebsiella pneumoniae - MIC*    AMPICILLIN >=32 RESISTANT Resistant     CEFAZOLIN <=4 SENSITIVE Sensitive     CEFTRIAXONE <=1 SENSITIVE Sensitive     CIPROFLOXACIN >=4 RESISTANT Resistant     GENTAMICIN <=1 SENSITIVE Sensitive     IMIPENEM <=0.25 SENSITIVE Sensitive     NITROFURANTOIN 128 RESISTANT Resistant     TRIMETH/SULFA >=320 RESISTANT Resistant     AMPICILLIN/SULBACTAM 4 SENSITIVE Sensitive     PIP/TAZO <=4 SENSITIVE Sensitive     Extended ESBL NEGATIVE Sensitive     * >=100,000 COLONIES/mL KLEBSIELLA PNEUMONIAE  Blood Culture (routine x 2)     Status: Abnormal   Collection Time: 09/19/17  1:35 AM  Result Value Ref Range Status   Specimen Description   Final    BLOOD RIGHT ANTECUBITAL Performed at [place] [address], [place], [place] [address]    Special Requests   Final    BOTTLES DRAWN AEROBIC AND ANAEROBIC  Blood Culture adequate volume Performed at [place] [address], [place], [place] [address]    Culture  Setup Time   Final    IN BOTH AEROBIC AND ANAEROBIC BOTTLES CRITICAL VALUE NOTED.  VALUE IS CONSISTENT WITH PREVIOUSLY REPORTED AND CALLED VALUE. GRAM NEGATIVE RODS    Culture (A)  Final    KLEBSIELLA PNEUMONIAE SUSCEPTIBILITIES PERFORMED ON PREVIOUS CULTURE WITHIN THE LAST 5 DAYS. Performed at [facility], [place] [address] [place], [place] [address]    Report Status 09/21/2017 FINAL  Final  Blood Culture (routine x 2)     Status: Abnormal   Collection Time: 09/19/17  1:35 AM  Result Value Ref Range Status   Specimen Description   Final    BLOOD LEFT ANTECUBITAL Performed at [facility] [place] [address] [place], [place] [address]    Special Requests   Final    BOTTLES DRAWN AEROBIC AND ANAEROBIC Blood Culture adequate volume Performed at [place] [address] [place], [place] [address]    Culture  Setup Time   Final    GRAM NEGATIVE RODS IN BOTH AEROBIC AND ANAEROBIC BOTTLES CRITICAL RESULT CALLED TO, READ BACK BY AND VERIFIED WITH: Lavell Luster [facility] 8841 09/19/17 HMILES Performed at [facility], [place] [address] [place], [place] [address]    Culture KLEBSIELLA PNEUMONIAE (A)  Final   Report Status 09/21/2017 FINAL  Final   Organism ID, Bacteria KLEBSIELLA PNEUMONIAE  Final      Susceptibility   Klebsiella pneumoniae - MIC*    AMPICILLIN >=32 RESISTANT Resistant     CEFAZOLIN <=4 SENSITIVE Sensitive     CEFEPIME <=1 SENSITIVE Sensitive     CEFTAZIDIME <=1 SENSITIVE Sensitive     CEFTRIAXONE <=1 SENSITIVE Sensitive     CIPROFLOXACIN >=4 RESISTANT Resistant     GENTAMICIN <=1 SENSITIVE Sensitive     IMIPENEM <=0.25 SENSITIVE Sensitive     TRIMETH/SULFA >=320 RESISTANT Resistant     AMPICILLIN/SULBACTAM 4 SENSITIVE Sensitive     PIP/TAZO <=4 SENSITIVE Sensitive     Extended ESBL  NEGATIVE Sensitive     * KLEBSIELLA PNEUMONIAE  Blood Culture ID Panel (Reflexed)     Status: Abnormal   Collection Time: 09/19/17  1:35 AM  Result Value Ref Range Status  Enterococcus species NOT DETECTED NOT DETECTED Final   Listeria monocytogenes NOT DETECTED NOT DETECTED Final   Staphylococcus species NOT DETECTED NOT DETECTED Final   Staphylococcus aureus NOT DETECTED NOT DETECTED Final   Streptococcus species NOT DETECTED NOT DETECTED Final   Streptococcus agalactiae NOT DETECTED NOT DETECTED Final   Streptococcus pneumoniae NOT DETECTED NOT DETECTED Final   Streptococcus pyogenes NOT DETECTED NOT DETECTED Final   Acinetobacter baumannii NOT DETECTED NOT DETECTED Final   Enterobacteriaceae species DETECTED (A) NOT DETECTED Final    Comment: Enterobacteriaceae represent a large family of gram-negative bacteria, not a single organism. CRITICAL RESULT CALLED TO, READ BACK BY AND VERIFIED WITH: Lavell Luster PHARMD 2309 09/19/17 HMILES    Enterobacter cloacae complex NOT DETECTED NOT DETECTED Final   Escherichia coli NOT DETECTED NOT DETECTED Final   Klebsiella oxytoca NOT DETECTED NOT DETECTED Final   Klebsiella pneumoniae DETECTED (A) NOT DETECTED Final    Comment: CRITICAL RESULT CALLED TO, READ BACK BY AND VERIFIED WITH: J.GRIMSLEY PHARMD 2309 09/19/17 HMILES    Proteus species NOT DETECTED NOT DETECTED Final   Serratia marcescens NOT DETECTED NOT DETECTED Final   Carbapenem resistance NOT DETECTED NOT DETECTED Final   Haemophilus influenzae NOT DETECTED NOT DETECTED Final   Neisseria meningitidis NOT DETECTED NOT DETECTED Final   Pseudomonas aeruginosa NOT DETECTED NOT DETECTED Final   Candida albicans NOT DETECTED NOT DETECTED Final   Candida glabrata NOT DETECTED NOT DETECTED Final   Candida krusei NOT DETECTED NOT DETECTED Final   Candida parapsilosis NOT DETECTED NOT DETECTED Final   Candida tropicalis NOT DETECTED NOT DETECTED Final    Comment: Performed at [facility], [place]. [address] [place], [place] [address]  Urine Culture     Status: Abnormal   Collection Time: 09/19/17 10:17 AM  Result Value Ref Range Status   Specimen Description   Final    URINE, CATHETERIZED CYSTO Performed at [facility] [address] [place], [place] [address]    Special Requests   Final    NONE Performed at [facility], [place] [address], [place], [place] [address]    Culture (A)  Final    <10,000 COLONIES/mL INSIGNIFICANT GROWTH Performed at [place] [address] [place], [place] [address]    Report Status 09/21/2017 FINAL  Final     Liver Function Tests: Recent Labs  Lab 09/18/17 2349  AST 31  ALT 50  ALKPHOS 120  BILITOT 0.8  PROT 6.7  ALBUMIN 2.7*   Recent Labs  Lab 09/18/17 2349  LIPASE 21   No results for input(s): AMMONIA in the last 168 hours.  Cardiac Enzymes: No results for input(s): CKTOTAL, CKMB, CKMBINDEX, TROPONINI in the last 168 hours. BNP (last 3 results) Recent Labs    09/19/17 1316  BNP 251.6*    ProBNP (last 3 results) No results for input(s): PROBNP in the last 8760 hours.    Studies: No results found.  Scheduled Meds: . ipratropium-albuterol  3 mL Nebulization BID  . levothyroxine  200 mcg Oral QAC breakfast  . loratadine  10 mg Oral Daily  . sodium bicarbonate  650 mg Oral TID  . vitamin C  1,000 mg Oral Daily  . Vitamin D (Ergocalciferol)  50,000 Units Oral Q Sun      Time spent: 20 min  [facility] Pager [phone]. If 7PM-7AM, please contact night-coverage at [email or web address], Office  [place]  09/23/2017, 12:15 PM  LOS: 4 days

## 2017-09-23 NOTE — Progress Notes (Signed)
This CM met with pt at bedside to go over PT recommendations. At this time pt declines home health PT. Pt states she has a lot of help at home and feels that she is getting stronger quickly.  Pt states she will let her nurse know if she changes her mind. Marney Doctor RN,BSN,NCM [phone]

## 2017-09-24 LAB — BASIC METABOLIC PANEL
ANION GAP: 15 (ref 5–15)
BUN: 109 mg/dL — ABNORMAL HIGH (ref 6–20)
CO2: 24 mmol/L (ref 22–32)
Calcium: 8.8 mg/dL — ABNORMAL LOW (ref 8.9–10.3)
Chloride: 103 mmol/L (ref 101–111)
Creatinine, Ser: 5.39 mg/dL — ABNORMAL HIGH (ref 0.44–1.00)
GFR calc Af Amer: 9 mL/min — ABNORMAL LOW (ref >60)
GFR calc non Af Amer: 8 mL/min — ABNORMAL LOW (ref >60)
GLUCOSE: 151 mg/dL — AB (ref 65–99)
POTASSIUM: 3.4 mmol/L — AB (ref 3.5–5.1)
Sodium: 142 mmol/L (ref 135–145)

## 2017-09-24 LAB — URINE CULTURE: Culture: NO GROWTH

## 2017-09-24 MED ORDER — POTASSIUM CHLORIDE CRYS ER 20 MEQ PO TBCR
20.0000 meq | EXTENDED_RELEASE_TABLET | Freq: Once | ORAL | Status: AC
  Administered 2017-09-24: 20 meq via ORAL
  Filled 2017-09-24: qty 1

## 2017-09-24 NOTE — Progress Notes (Signed)
Referring Physician(s): McKenzie,P  Supervising Physician: Daryll Brod  Patient Status:  [facility] - In-pt  Chief Complaint: Renal stones, left hydronephrosis     Subjective: Patient resting quietly in bed.  No significant complaints at this time.  Peripheral IV recently fell out and needs to be replaced.   Allergies: Band-aid plus antibiotic [bacitracin-polymyxin b] and Iodine  Medications: Prior to Admission medications   Medication Sig Start Date End Date Taking? Authorizing Provider  acetaminophen (TYLENOL) 325 MG tablet Take 650 mg by mouth every 6 (six) hours as needed for mild pain.    Yes [provider]  albuterol (PROVENTIL HFA;VENTOLIN HFA) 108 (90 BASE) MCG/ACT inhaler Inhale 2 puffs into the lungs every 6 (six) hours as needed for wheezing or shortness of breath.   Yes [provider]  Ascorbic Acid (VITAMIN C) 1000 MG tablet Take 1,000 mg by mouth daily.    Yes [provider]  cetirizine (ZYRTEC) 10 MG tablet Take 10 mg by mouth daily.   Yes [provider]  ergocalciferol (VITAMIN D2) 50000 UNITS capsule Take 50,000 Units by mouth every Sunday.    Yes [provider]  fenofibrate 160 MG tablet Take 160 mg by mouth daily. 09/15/17  Yes [provider]  ibuprofen (ADVIL,MOTRIN) 200 MG tablet Take 400 mg by mouth every 6 (six) hours as needed for headache, mild pain or moderate pain.    Yes [provider]  levothyroxine (SYNTHROID, LEVOTHROID) 175 MCG tablet Take 175 mcg by mouth daily before breakfast.   Yes [provider]  lisinopril (PRINIVIL,ZESTRIL) 20 MG tablet Take 40 mg by mouth daily.    Yes [provider]  Multiple Vitamins-Minerals (MULTIVITAMIN WITH MINERALS) tablet Take 1 tablet by mouth daily.   Yes [provider]     Vital Signs: BP 134/65 (BP Location: Right Arm)   Pulse 91   Temp 98.4 F (36.9 C) (Oral)   Resp 18   Ht 5\' 8"  (1.727 m)   Wt 282 lb  (127.9 kg)   SpO2 96%   BMI 42.88 kg/m   Physical Exam left nephrostomy intact, insertion site okay, output 125 cc today, 950 cc yesterday low urine  Imaging: Dg Chest Port 1 View  Result Date: 09/21/2017 CLINICAL DATA:  Followup shortness of Breath EXAM: PORTABLE CHEST 1 VIEW COMPARISON:  09/19/2017 FINDINGS: Slight improvement in aeration. Mild residual right base atelectasis. No focal opacity on the left. Heart is mildly enlarged. Suspect small layering right effusion. IMPRESSION: Small right effusion with right base atelectasis. Some improvement in aeration since prior study. Electronically Signed   By: Rolm Baptise M.D.   On: 09/21/2017 09:46    Labs:  CBC: Recent Labs    09/20/17 0555 09/21/17 0514 09/22/17 0615 09/23/17 0605  WBC 9.6 12.4* 15.9* 14.9*  HGB 10.7* 11.5* 11.9* 11.4*  HCT 33.4* 35.8* 36.1 35.4*  PLT 166 163 195 188    COAGS: Recent Labs    09/19/17 1316  INR 1.08    BMP: Recent Labs    09/21/17 0514 09/22/17 0615 09/23/17 0605 09/24/17 0601  NA 139 140 142 142  K 4.7 3.6 3.3* 3.4*  CL 106 100* 99* 103  CO2 15* 21* 24 24  GLUCOSE 169* 151* 158* 151*  BUN 107* 113* 126* 109*  CALCIUM 8.2* 8.4* 8.7* 8.8*  CREATININE 8.20* 7.46* 6.36* 5.39*  GFRNONAA 5* 5* 6* 8*  GFRAA 5* 6* 7* 9*    LIVER FUNCTION TESTS: Recent Labs  09/18/17 2349  BILITOT 0.8  AST 31  ALT 50  ALKPHOS 120  PROT 6.7  ALBUMIN 2.7*    Assessment and Plan: Pt with hx nephrolithiasis, obstructive bilat hydronephrosis; s/p rt JJ by urology 2/17, left PCN in IR 2/18; afebrile, creatinine 5.39 (6.36); continue left PCN for now as per urology.     Electronically Signed: D. Rowe Robert, PA-C 09/24/2017, 4:56 PM   I spent a total of 15 minutes  at the the patient's bedside AND on the patient's hospital floor or unit, greater than 50% of which was counseling/coordinating care for left nephrostomy    Patient ID: Sheila Leach, female   DOB: [birth year], 62 y.o.   MRN:  [id number]

## 2017-09-24 NOTE — Progress Notes (Signed)
[facility]  PROGRESS NOTE  Sheila Leach GLO:[id number] DOB: October 30, 1955 DOA: 09/19/2017 PCP: Shon Baton, MD   Brief HPI:   62 y.o.femalewith PMH of DJD, HTN, Asthma, Hypothyroidism, CKD, Nephrolithiasis, presented with nausea, vomiting, diarrhea associated with flank pains for several days. She has developed worsening back pains with hematuria last night and presented to emergency department for further evaluation. No hematemesis, no hematochezia, no acute abdominal pains but had mild lower abdominal discomfort , no acute chest pains or shortness of breath. She had mild productive cough, no wheezing. He reports some chills no fevers. She reports taking nsaids for few days   ED: CT abd showed hydronephrosis, creatinine 8.7, previously at 1.4. ED d/w urology, S/p right ureteral stent placement 2/17, L PCN placement     Subjective   Patient seen and examined, denies any pain. Nosebleed has resolved. Heparin discontinued.   Assessment/Plan:     1. 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 Patient underwent cystoscopy with right sided JJs stent placement on September 19, 2017, along with left sided percutaneous nephrostomy  placement  by IR on September 20, 2017. Renal function is slowly improving. Called and discussed with nephrologist Dr. Benjamine Sprague, who recommends to continue IV fluids and monitoring renal function until creatinine improves around 4.0. Today BUN/creatinine is 109/5.39 2. Epistaxis-resolved, patient developed [place] last night. Heparin has been discontinued. BUN did go up a little due to swallowing of blood. 3. UTI/pyelonephritis-urine culture obtained on September 18, 2017 grew Klebsiella pneumoniae, sensitive to ceftriaxone. Continue ceftriaxone 4. Acute kidney  injury-patient presented with creatinine of 8.7, baseline creatinine around 1.4. Secondary to obstructive neuropathy as above. Currently on gentle IV hydration with normal saline at 50 ML per hour. Creatinine is slowly improving, today creatinine is 5.39. Continue sodium bicarbonate tablets three times a day 5. Chronic asthma- no signs and symptoms of exacerbation. Continue PRN bronchodilators. 6. Hypothyroidism-TSH was elevated and T3 was low, Synthroid dose increased to 200 g daily. Repeat TSH along with T-3 and free T 4 in  4 to 6 weeks as outpatient 7. Hypertension-lisinopril currently on hold due to above. Blood pressure is stable. 8. Nausea vomiting and diarrhea-the setting of UTI, kidney stones. Improved with supportive care. 9. Dyspnea-patient developed mild fluid overload on February 1919, required IV Lasix. Echocardiogram showed ejection fraction in the range of 50 to 55%. Breathing is stable.     DVT prophylaxis: heparin  Code Status: full code  Family Communication: no family at bedside  Disposition Plan: likely home  With home health.   Consultants:  Urology  IR  Procedures:   Nephrostomy tube placement by IR.  On 09/20/2017  Cystoscopy with right-sided double-J stent placement on 09/19/2017 by urology    Continuous infusions . sodium chloride 50 mL/hr at 09/24/17 0918  . cefTRIAXone (ROCEPHIN)  IV Stopped (09/23/17 2207)      Antibiotics:   Anti-infectives (From admission, onward)   Start     Dose/Rate Route Frequency Ordered Stop   09/20/17 1230  ceFAZolin (ANCEF) IVPB 2g/100 mL premix     2 g 200 mL/hr over 30 Minutes Intravenous  Once 09/20/17 1229 09/20/17 1524   09/20/17 0300  cefTRIAXone (ROCEPHIN) 1 g in sodium chloride 0.9 % 100 mL IVPB  Status:  Discontinued     1 g 200 mL/hr over 30 Minutes Intravenous  Every 24 hours 09/19/17 0813 09/19/17 2311   09/19/17 2315  cefTRIAXone (ROCEPHIN) 2 g in sodium chloride 0.9 % 100 mL IVPB     2 g 200 mL/hr  over 30 Minutes Intravenous Daily at bedtime 09/19/17 2311     09/19/17 2200  cefTRIAXone (ROCEPHIN) 2 g in sodium chloride 0.9 % 100 mL IVPB  Status:  Discontinued     2 g 200 mL/hr over 30 Minutes Intravenous Every 24 hours 09/19/17 0151 09/19/17 0813   09/19/17 0130  cefTRIAXone (ROCEPHIN) 2 g in sodium chloride 0.9 % 100 mL IVPB     2 g 200 mL/hr over 30 Minutes Intravenous  Once 09/19/17 0129 09/19/17 0344       Objective   Vitals:   09/23/17 2152 09/24/17 0446 09/24/17 1054 09/24/17 1418  BP:  140/67  134/65  Pulse:  90  91  Resp:  18  18  Temp:  98 F (36.7 C)  98.4 F (36.9 C)  TempSrc:  Oral  Oral  SpO2: 96% 96% 96% 96%  Weight:      Height:        Intake/Output Summary (Last 24 hours) at 09/24/2017 1433 Last data filed at 09/24/2017 0801 Gross per 24 hour  Intake 35 ml  Output 775 ml  Net -740 ml   Filed Weights   09/18/17 2211  Weight: 127.9 kg (282 lb)     Physical Examination:   Physical Exam: Eyes: No icterus, extraocular muscles intact  Mouth: Oral mucosa is moist, no lesions on palate,  Neck: Supple, no deformities, masses, or tenderness Lungs: Normal respiratory effort, bilateral clear to auscultation, no crackles or wheezes.  Heart: Regular rate and rhythm, S1 and S2 normal, no murmurs, rubs auscultated Abdomen: BS normoactive,soft,nondistended,non-tender to palpation,no organomegaly. Left PCN tube in place Extremities: No pretibial edema, no erythema, no cyanosis, no clubbing Neuro : Alert and oriented to time, place and person, No focal deficits  Skin: No rashes seen on exam       Data Reviewed: I have personally reviewed following labs and imaging studies  CBG: No results for input(s): GLUCAP in the last 168 hours.  CBC: Recent Labs  Lab 09/18/17 2349 09/20/17 0555 09/21/17 0514 09/22/17 0615 09/23/17 0605  WBC 14.9* 9.6 12.4* 15.9* 14.9*  HGB 11.8* 10.7* 11.5* 11.9* 11.4*  HCT 34.6* 33.4* 35.8* 36.1 35.4*  MCV 88.3 92.3  91.8 90.0 91.2  PLT 177 166 163 195 671    Basic Metabolic Panel: Recent Labs  Lab 09/20/17 0555 09/21/17 0514 09/22/17 0615 09/23/17 0605 09/24/17 0601  NA 139 139 140 142 142  K 5.1 4.7 3.6 3.3* 3.4*  CL 107 106 100* 99* 103  CO2 14* 15* 21* 24 24  GLUCOSE 216* 169* 151* 158* 151*  BUN 119* 107* 113* 126* 109*  CREATININE 8.65* 8.20* 7.46* 6.36* 5.39*  CALCIUM 8.3* 8.2* 8.4* 8.7* 8.8*  MG  --   --  1.8  --   --     Recent Results (from the past 240 hour(s))  Culture, Urine     Status: Abnormal   Collection Time: 09/18/17 11:30 PM  Result Value Ref Range Status   Specimen Description   Final    URINE, CLEAN CATCH Performed at [facility], [place] [address] [place], [place] [address]    Special Requests   Final    catheterize if needed Performed at [facility], [place] [address], [place], [place] [address]  Culture >=100,000 COLONIES/mL KLEBSIELLA PNEUMONIAE (A)  Final   Report Status 09/21/2017 FINAL  Final   Organism ID, Bacteria KLEBSIELLA PNEUMONIAE (A)  Final      Susceptibility   Klebsiella pneumoniae - MIC*    AMPICILLIN >=32 RESISTANT Resistant     CEFAZOLIN <=4 SENSITIVE Sensitive     CEFTRIAXONE <=1 SENSITIVE Sensitive     CIPROFLOXACIN >=4 RESISTANT Resistant     GENTAMICIN <=1 SENSITIVE Sensitive     IMIPENEM <=0.25 SENSITIVE Sensitive     NITROFURANTOIN 128 RESISTANT Resistant     TRIMETH/SULFA >=320 RESISTANT Resistant     AMPICILLIN/SULBACTAM 4 SENSITIVE Sensitive     PIP/TAZO <=4 SENSITIVE Sensitive     Extended ESBL NEGATIVE Sensitive     * >=100,000 COLONIES/mL KLEBSIELLA PNEUMONIAE  Blood Culture (routine x 2)     Status: Abnormal   Collection Time: 09/19/17  1:35 AM  Result Value Ref Range Status   Specimen Description   Final    BLOOD RIGHT ANTECUBITAL Performed at [facility] [address], [place], [place] [address]    Special Requests   Final    BOTTLES DRAWN AEROBIC AND  ANAEROBIC Blood Culture adequate volume Performed at [facility] [address] [place], [place] [address]    Culture  Setup Time   Final    IN BOTH AEROBIC AND ANAEROBIC BOTTLES CRITICAL VALUE NOTED.  VALUE IS CONSISTENT WITH PREVIOUSLY REPORTED AND CALLED VALUE. GRAM NEGATIVE RODS    Culture (A)  Final    KLEBSIELLA PNEUMONIAE SUSCEPTIBILITIES PERFORMED ON PREVIOUS CULTURE WITHIN THE LAST 5 DAYS. Performed at [facility], [place] [address], [place], [place] [address]    Report Status 09/21/2017 FINAL  Final  Blood Culture (routine x 2)     Status: Abnormal   Collection Time: 09/19/17  1:35 AM  Result Value Ref Range Status   Specimen Description   Final    BLOOD LEFT ANTECUBITAL Performed at [place] [address], [place], [place] [address]    Special Requests   Final    BOTTLES DRAWN AEROBIC AND ANAEROBIC Blood Culture adequate volume Performed at [place] [address] [place], [place] [address]    Culture  Setup Time   Final    GRAM NEGATIVE RODS IN BOTH AEROBIC AND ANAEROBIC BOTTLES CRITICAL RESULT CALLED TO, READ BACK BY AND VERIFIED WITH: Lavell Luster [facility] [id number] 09/19/17 HMILES Performed at [facility], [place] [address], [place], [place] [address]    Culture KLEBSIELLA PNEUMONIAE (A)  Final   Report Status 09/21/2017 FINAL  Final   Organism ID, Bacteria KLEBSIELLA PNEUMONIAE  Final      Susceptibility   Klebsiella pneumoniae - MIC*    AMPICILLIN >=32 RESISTANT Resistant     CEFAZOLIN <=4 SENSITIVE Sensitive     CEFEPIME <=1 SENSITIVE Sensitive     CEFTAZIDIME <=1 SENSITIVE Sensitive     CEFTRIAXONE <=1 SENSITIVE Sensitive     CIPROFLOXACIN >=4 RESISTANT Resistant     GENTAMICIN <=1 SENSITIVE Sensitive     IMIPENEM <=0.25 SENSITIVE Sensitive     TRIMETH/SULFA >=320 RESISTANT Resistant     AMPICILLIN/SULBACTAM 4 SENSITIVE Sensitive     PIP/TAZO <=4 SENSITIVE Sensitive     Extended  ESBL NEGATIVE Sensitive     * KLEBSIELLA PNEUMONIAE  Blood Culture ID Panel (Reflexed)     Status: Abnormal   Collection Time: 09/19/17  1:35 AM  Result Value Ref  Range Status   Enterococcus species NOT DETECTED NOT DETECTED Final   Listeria monocytogenes NOT DETECTED NOT DETECTED Final   Staphylococcus species NOT DETECTED NOT DETECTED Final   Staphylococcus aureus NOT DETECTED NOT DETECTED Final   Streptococcus species NOT DETECTED NOT DETECTED Final   Streptococcus agalactiae NOT DETECTED NOT DETECTED Final   Streptococcus pneumoniae NOT DETECTED NOT DETECTED Final   Streptococcus pyogenes NOT DETECTED NOT DETECTED Final   Acinetobacter baumannii NOT DETECTED NOT DETECTED Final   Enterobacteriaceae species DETECTED (A) NOT DETECTED Final    Comment: Enterobacteriaceae represent a large family of gram-negative bacteria, not a single organism. CRITICAL RESULT CALLED TO, READ BACK BY AND VERIFIED WITH: Lavell Luster PHARMD 2309 09/19/17 HMILES    Enterobacter cloacae complex NOT DETECTED NOT DETECTED Final   Escherichia coli NOT DETECTED NOT DETECTED Final   Klebsiella oxytoca NOT DETECTED NOT DETECTED Final   Klebsiella pneumoniae DETECTED (A) NOT DETECTED Final    Comment: CRITICAL RESULT CALLED TO, READ BACK BY AND VERIFIED WITH: J.GRIMSLEY PHARMD 2309 09/19/17 HMILES    Proteus species NOT DETECTED NOT DETECTED Final   Serratia marcescens NOT DETECTED NOT DETECTED Final   Carbapenem resistance NOT DETECTED NOT DETECTED Final   Haemophilus influenzae NOT DETECTED NOT DETECTED Final   Neisseria meningitidis NOT DETECTED NOT DETECTED Final   Pseudomonas aeruginosa NOT DETECTED NOT DETECTED Final   Candida albicans NOT DETECTED NOT DETECTED Final   Candida glabrata NOT DETECTED NOT DETECTED Final   Candida krusei NOT DETECTED NOT DETECTED Final   Candida parapsilosis NOT DETECTED NOT DETECTED Final   Candida tropicalis NOT DETECTED NOT DETECTED Final    Comment: Performed at [facility], [place]. [address], [place], [place] [address]  Urine Culture     Status: Abnormal   Collection Time: 09/19/17 10:17 AM  Result Value Ref Range Status   Specimen Description   Final    URINE, CATHETERIZED CYSTO Performed at [facility] [address] [place], [place] [address]    Special Requests   Final    NONE Performed at [facility], [place] [address], [place], [place] [address]    Culture (A)  Final    <10,000 COLONIES/mL INSIGNIFICANT GROWTH Performed at [facility] [address], [place], [place] [address]    Report Status 09/21/2017 FINAL  Final  Urine Culture     Status: None   Collection Time: 09/22/17  1:55 PM  Result Value Ref Range Status   Specimen Description   Final    URINE, RANDOM Performed at [place] [address], [place], [place] [address]    Special Requests   Final    NEPHROSTOMY Performed at [facility], [place] [address] [place], [place] [address]    Culture   Final    NO GROWTH Performed at [facility], [place] [address] [place] [address], [place], [place] [address]    Report Status 09/24/2017 FINAL  Final     Liver Function Tests: Recent Labs  Lab 09/18/17 2349  AST 31  ALT 50  ALKPHOS 120  BILITOT 0.8  PROT 6.7  ALBUMIN 2.7*   Recent Labs  Lab 09/18/17 2349  LIPASE 21   No results for input(s): AMMONIA in the last 168 hours.  Cardiac Enzymes: No results for input(s): CKTOTAL, CKMB, CKMBINDEX, TROPONINI in the last 168 hours. BNP (last 3 results) Recent Labs    09/19/17 1316  BNP 251.6*  ProBNP (last 3 results) No results for input(s): PROBNP in the last 8760 hours.    Studies: No results found.  Scheduled Meds: . ipratropium-albuterol  3 mL Nebulization BID  . levothyroxine  200 mcg Oral QAC breakfast  . loratadine  10 mg Oral Daily  . sodium bicarbonate  650 mg Oral TID  . vitamin C  1,000 mg Oral Daily  . Vitamin D  (Ergocalciferol)  50,000 Units Oral Q Sun      Time spent: 20 min  [facility] Pager [phone]. If 7PM-7AM, please contact night-coverage at [email or web address], Office  [phone]  password [email or web address]  09/24/2017, 2:33 PM  LOS: 5 days

## 2017-09-24 NOTE — Plan of Care (Signed)
  Activity: Risk for activity intolerance will decrease 09/24/2017 2016 - Progressing by Dorene Sorrow, RN   Nutrition: Adequate nutrition will be maintained 09/24/2017 2016 - Progressing by Dorene Sorrow, RN   Elimination: Will not experience complications related to bowel motility 09/24/2017 2016 - Progressing by Dorene Sorrow, RN

## 2017-09-24 NOTE — Progress Notes (Signed)
Physical Therapy Treatment Patient Details Name: Sheila Leach MRN: [id number] DOB: 11-07-1955 Today's Date: 09/24/2017    History of Present Illness Sheila Leach is a 62 y.o. female with PMH of DJD, HTN, Asthma, Hypothyroidism, CKD, Nephrolithiasis, presented with nausea, vomiting, diarrhea associated with flank pains for several days. Pt now s/p bilateral obstructing stones and urosepsis s/p right ureteral stent placement and left PCN placement.     PT Comments    Progressing with mobility.    Follow Up Recommendations  Home health PT(pt declined HH f/u)     Equipment Recommendations  None recommended by PT    Recommendations for Other Services       Precautions / Restrictions Precautions Precautions: Fall Precaution Comments: drain L flank Restrictions Weight Bearing Restrictions: No    Mobility  Bed Mobility Overal bed mobility: Independent                Transfers Overall transfer level: Modified independent                  Ambulation/Gait Ambulation/Gait assistance: Min guard Ambulation Distance (Feet): 50 Feet(50'x1; 15'x2) Assistive device: Quad cane Gait Pattern/deviations: Step-through pattern;Antalgic     General Gait Details: slow gait speed. Pt c/o some L knee pain. Used quad cane for hallway ambulation. Pt used IV pole during walk to/from bathroom   Stairs            Wheelchair Mobility    Modified Rankin (Stroke Patients Only)       Balance                                            Cognition Arousal/Alertness: Awake/alert Behavior During Therapy: WFL for tasks assessed/performed Overall Cognitive Status: Within Functional Limits for tasks assessed                                        Exercises      General Comments        Pertinent Vitals/Pain Pain Assessment: Faces Faces Pain Scale: Hurts even more Pain Location: L knee with ambulation Pain Descriptors / Indicators:  Discomfort;Aching Pain Intervention(s): Limited activity within patient's tolerance;Repositioned    Home Living                      Prior Function            PT Goals (current goals can now be found in the care plan section) Progress towards PT goals: Progressing toward goals    Frequency    Min 3X/week      PT Plan Current plan remains appropriate    Co-evaluation              AM-PAC PT "6 Clicks" Daily Activity  Outcome Measure  Difficulty turning over in bed (including adjusting bedclothes, sheets and blankets)?: None Difficulty moving from lying on back to sitting on the side of the bed? : None Difficulty sitting down on and standing up from a chair with arms (e.g., wheelchair, bedside commode, etc,.)?: A Little Help needed moving to and from a bed to chair (including a wheelchair)?: A Little Help needed walking in hospital room?: A Little Help needed climbing 3-5 steps with a railing? : A Lot 6 Click Score: 19  End of Session   Activity Tolerance: Patient limited by pain;Patient limited by fatigue Patient left: in bed;with call bell/phone within reach   PT Visit Diagnosis: Muscle weakness (generalized) (M62.81);Difficulty in walking, not elsewhere classified (R26.2)     Time: 6295-2841 PT Time Calculation (min) (ACUTE ONLY): 11 min  Charges:  $Gait Training: 8-22 mins                    G Codes:          Weston Anna, MPT Pager: [phone]

## 2017-09-25 LAB — BASIC METABOLIC PANEL
ANION GAP: 16 — AB (ref 5–15)
BUN: 86 mg/dL — ABNORMAL HIGH (ref 6–20)
CALCIUM: 8.5 mg/dL — AB (ref 8.9–10.3)
CHLORIDE: 101 mmol/L (ref 101–111)
CO2: 24 mmol/L (ref 22–32)
Creatinine, Ser: 4.57 mg/dL — ABNORMAL HIGH (ref 0.44–1.00)
GFR calc Af Amer: 11 mL/min — ABNORMAL LOW (ref >60)
GFR calc non Af Amer: 9 mL/min — ABNORMAL LOW (ref >60)
GLUCOSE: 204 mg/dL — AB (ref 65–99)
Potassium: 3.3 mmol/L — ABNORMAL LOW (ref 3.5–5.1)
Sodium: 141 mmol/L (ref 135–145)

## 2017-09-25 MED ORDER — POTASSIUM CHLORIDE CRYS ER 20 MEQ PO TBCR
40.0000 meq | EXTENDED_RELEASE_TABLET | Freq: Once | ORAL | Status: AC
  Administered 2017-09-25: 40 meq via ORAL
  Filled 2017-09-25: qty 2

## 2017-09-25 NOTE — Progress Notes (Signed)
[facility]  PROGRESS NOTE  Sheila Leach WFU:[id number] DOB: [birth year] DOA: 09/19/2017 PCP: Shon Baton, MD   Brief HPI:   62 y.o.femalewith PMH of DJD, HTN, Asthma, Hypothyroidism, CKD, Nephrolithiasis, presented with nausea, vomiting, diarrhea associated with flank pains for several days. She has developed worsening back pains with hematuria last night and presented to emergency department for further evaluation. No hematemesis, no hematochezia, no acute abdominal pains but had mild lower abdominal discomfort , no acute chest pains or shortness of breath. She had mild productive cough, no wheezing. He reports some chills no fevers. She reports taking nsaids for few days   ED: CT abd showed hydronephrosis, creatinine 8.7, previously at 1.4. ED d/w urology, S/p right ureteral stent placement 2/17, L PCN placement     Subjective   Patient seen and examined, denies any complaints.   Assessment/Plan:     1. 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 Patient underwent cystoscopy with right sided JJs stent placement on September 19, 2017, along with left sided percutaneous nephrostomy  placement  by IR on September 20, 2017. Renal function is slowly improving. Called and discussed with nephrologist Dr. Benjamine Sprague, who recommends to continue IV fluids and monitoring renal function until creatinine improves around 4.0. Today BUN/creatinine is 86/4.57 2. Hypokalemia-potassium is 3.3, will replace potassium and check BMP in am 3. Epistaxis-resolved, patient developed epistaxis s last night. Heparin has been discontinued. BUN did go up a little due to swallowing of blood. 4. UTI/pyelonephritis-urine culture obtained on September 18, 2017 grew Klebsiella pneumoniae, sensitive to ceftriaxone.  Continue ceftriaxone 5. Acute kidney injury-patient presented with creatinine of 8.7, baseline creatinine around 1.4. Secondary to obstructive neuropathy as above. Currently on gentle IV hydration with normal saline at 50 ML per hour. Creatinine is slowly improving, today creatinine is 4.57. Continue sodium bicarbonate tablets three times a day 6. Chronic asthma- no signs and symptoms of exacerbation. Continue PRN bronchodilators. 7. Hypothyroidism-TSH was elevated and T3 was low, Synthroid dose increased to 200 g daily. Repeat TSH along with T-3 and free T 4 in  4 to 6 weeks as outpatient 8. Hypertension-lisinopril currently on hold due to above. Blood pressure is stable. 9. Nausea vomiting and diarrhea-the setting of UTI, kidney stones. Improved with supportive care. 10. Dyspnea-patient developed mild fluid overload on February 1919, required IV Lasix. Echocardiogram showed ejection fraction in the range of 50 to 55%. Breathing is stable.     DVT prophylaxis: SCD  Code Status: full code  Family Communication: no family at bedside  Disposition Plan: likely home  With home health.   Consultants:  Urology  IR  Procedures:   Nephrostomy tube placement by IR.  On 09/20/2017  Cystoscopy with right-sided double-J stent placement on 09/19/2017 by urology    Continuous infusions . sodium chloride 50 mL/hr at 09/25/17 0544  . cefTRIAXone (ROCEPHIN)  IV Stopped (09/24/17 2249)      Antibiotics:   Anti-infectives (From admission, onward)   Start     Dose/Rate Route Frequency Ordered Stop   09/20/17 1230  ceFAZolin (ANCEF) IVPB 2g/100 mL premix     2 g 200 mL/hr over 30 Minutes Intravenous  Once 09/20/17 1229 09/20/17 1524   09/20/17 0300  cefTRIAXone (ROCEPHIN) 1 g in sodium chloride 0.9 % 100 mL IVPB  Status:  Discontinued  1 g 200 mL/hr over 30 Minutes Intravenous Every 24 hours 09/19/17 0813 09/19/17 2311   09/19/17 2315  cefTRIAXone (ROCEPHIN) 2 g in sodium chloride 0.9  % 100 mL IVPB     2 g 200 mL/hr over 30 Minutes Intravenous Daily at bedtime 09/19/17 2311     09/19/17 2200  cefTRIAXone (ROCEPHIN) 2 g in sodium chloride 0.9 % 100 mL IVPB  Status:  Discontinued     2 g 200 mL/hr over 30 Minutes Intravenous Every 24 hours 09/19/17 0151 09/19/17 0813   09/19/17 0130  cefTRIAXone (ROCEPHIN) 2 g in sodium chloride 0.9 % 100 mL IVPB     2 g 200 mL/hr over 30 Minutes Intravenous  Once 09/19/17 0129 09/19/17 0344       Objective   Vitals:   09/25/17 0517 09/25/17 0911 09/25/17 0914 09/25/17 0915  BP: (!) 147/69   (!) 160/70  Pulse: 84   94  Resp: 18   18  Temp: (!) 97.5 F (36.4 C)   98 F (36.7 C)  TempSrc: Oral   Oral  SpO2: 100% 99% 99% 100%  Weight:      Height:        Intake/Output Summary (Last 24 hours) at 09/25/2017 1112 Last data filed at 09/25/2017 0830 Gross per 24 hour  Intake 1366.67 ml  Output 875 ml  Net 491.67 ml   Filed Weights   09/18/17 2211  Weight: 127.9 kg (282 lb)     Physical Examination:   Physical Exam: Eyes: No icterus, extraocular muscles intact  Mouth: Oral mucosa is moist, no lesions on palate,  Neck: Supple, no deformities, masses, or tenderness Lungs: Normal respiratory effort, bilateral clear to auscultation, no crackles or wheezes.  Heart: Regular rate and rhythm, S1 and S2 normal, no murmurs, rubs auscultated Abdomen: BS normoactive,soft,nondistended,non-tender to palpation,no organomegaly Extremities: No pretibial edema, no erythema, no cyanosis, no clubbing Neuro : Alert and oriented to time, place and person, No focal deficits  Skin: No rashes seen on exam       Data Reviewed: I have personally reviewed following labs and imaging studies  CBG: No results for input(s): GLUCAP in the last 168 hours.  CBC: Recent Labs  Lab 09/18/17 2349 09/20/17 0555 09/21/17 0514 09/22/17 0615 09/23/17 0605  WBC 14.9* 9.6 12.4* 15.9* 14.9*  HGB 11.8* 10.7* 11.5* 11.9* 11.4*  HCT 34.6* 33.4*  35.8* 36.1 35.4*  MCV 88.3 92.3 91.8 90.0 91.2  PLT 177 166 163 195 315    Basic Metabolic Panel: Recent Labs  Lab 09/21/17 0514 09/22/17 0615 09/23/17 0605 09/24/17 0601 09/25/17 0620  NA 139 140 142 142 141  K 4.7 3.6 3.3* 3.4* 3.3*  CL 106 100* 99* 103 101  CO2 15* 21* 24 24 24   GLUCOSE 169* 151* 158* 151* 204*  BUN 107* 113* 126* 109* 86*  CREATININE 8.20* 7.46* 6.36* 5.39* 4.57*  CALCIUM 8.2* 8.4* 8.7* 8.8* 8.5*  MG  --  1.8  --   --   --     Recent Results (from the past 240 hour(s))  Culture, Urine     Status: Abnormal   Collection Time: 09/18/17 11:30 PM  Result Value Ref Range Status   Specimen Description   Final    URINE, CLEAN CATCH Performed at [facility], [place] [address], [place], [place] [address]    Special Requests   Final    catheterize if needed Performed at [facility], [address] [place], [place] [address]    Culture >=100,000 COLONIES/mL KLEBSIELLA PNEUMONIAE (A)  Final   Report Status 09/21/2017 FINAL  Final   Organism ID, Bacteria KLEBSIELLA PNEUMONIAE (A)  Final      Susceptibility   Klebsiella pneumoniae - MIC*    AMPICILLIN >=32 RESISTANT Resistant     CEFAZOLIN <=4 SENSITIVE Sensitive     CEFTRIAXONE <=1 SENSITIVE Sensitive     CIPROFLOXACIN >=4 RESISTANT Resistant     GENTAMICIN <=1 SENSITIVE Sensitive     IMIPENEM <=0.25 SENSITIVE Sensitive     NITROFURANTOIN 128 RESISTANT Resistant     TRIMETH/SULFA >=320 RESISTANT Resistant     AMPICILLIN/SULBACTAM 4 SENSITIVE Sensitive     PIP/TAZO <=4 SENSITIVE Sensitive     Extended ESBL NEGATIVE Sensitive     * >=100,000 COLONIES/mL KLEBSIELLA PNEUMONIAE  Blood Culture (routine x 2)     Status: Abnormal   Collection Time: 09/19/17  1:35 AM  Result Value Ref Range Status   Specimen Description   Final    BLOOD RIGHT ANTECUBITAL Performed at [facility] [address], [place], [place] [address]    Special Requests   Final     BOTTLES DRAWN AEROBIC AND ANAEROBIC Blood Culture adequate volume Performed at [facility] [address], [place], [place] [address]    Culture  Setup Time   Final    IN BOTH AEROBIC AND ANAEROBIC BOTTLES CRITICAL VALUE NOTED.  VALUE IS CONSISTENT WITH PREVIOUSLY REPORTED AND CALLED VALUE. GRAM NEGATIVE RODS    Culture (A)  Final    KLEBSIELLA PNEUMONIAE SUSCEPTIBILITIES PERFORMED ON PREVIOUS CULTURE WITHIN THE LAST 5 DAYS. Performed at [facility], [place] [address], [place], [place] [address]    Report Status 09/21/2017 FINAL  Final  Blood Culture (routine x 2)     Status: Abnormal   Collection Time: 09/19/17  1:35 AM  Result Value Ref Range Status   Specimen Description   Final    BLOOD LEFT ANTECUBITAL Performed at [facility] [address] [place], [place] [address]    Special Requests   Final    BOTTLES DRAWN AEROBIC AND ANAEROBIC Blood Culture adequate volume Performed at [facility] [address], [place], [place] [address]    Culture  Setup Time   Final    GRAM NEGATIVE RODS IN BOTH AEROBIC AND ANAEROBIC BOTTLES CRITICAL RESULT CALLED TO, READ BACK BY AND VERIFIED WITH: Lavell Luster [facility] 8182 09/19/17 HMILES Performed at [facility], [place] [address], [place], [place] [address]    Culture KLEBSIELLA PNEUMONIAE (A)  Final   Report Status 09/21/2017 FINAL  Final   Organism ID, Bacteria KLEBSIELLA PNEUMONIAE  Final      Susceptibility   Klebsiella pneumoniae - MIC*    AMPICILLIN >=32 RESISTANT Resistant     CEFAZOLIN <=4 SENSITIVE Sensitive     CEFEPIME <=1 SENSITIVE Sensitive     CEFTAZIDIME <=1 SENSITIVE Sensitive     CEFTRIAXONE <=1 SENSITIVE Sensitive     CIPROFLOXACIN >=4 RESISTANT Resistant     GENTAMICIN <=1 SENSITIVE Sensitive     IMIPENEM <=0.25 SENSITIVE Sensitive     TRIMETH/SULFA >=320 RESISTANT Resistant     AMPICILLIN/SULBACTAM 4 SENSITIVE Sensitive     PIP/TAZO <=4 SENSITIVE  Sensitive     Extended ESBL NEGATIVE Sensitive     * KLEBSIELLA PNEUMONIAE  Blood Culture ID Panel (Reflexed)     Status: Abnormal   Collection Time: 09/19/17  1:35 AM  Result Value Ref Range Status   Enterococcus species NOT DETECTED NOT DETECTED Final   Listeria monocytogenes NOT DETECTED NOT DETECTED Final   Staphylococcus species NOT DETECTED NOT DETECTED Final   Staphylococcus aureus NOT DETECTED NOT DETECTED Final   Streptococcus species NOT DETECTED NOT DETECTED Final   Streptococcus agalactiae NOT DETECTED NOT DETECTED Final   Streptococcus pneumoniae NOT DETECTED NOT DETECTED Final   Streptococcus pyogenes NOT DETECTED NOT DETECTED Final   Acinetobacter baumannii NOT DETECTED NOT DETECTED Final   Enterobacteriaceae species DETECTED (A) NOT DETECTED Final    Comment: Enterobacteriaceae represent a large family of gram-negative bacteria, not a single organism. CRITICAL RESULT CALLED TO, READ BACK BY AND VERIFIED WITH: Lavell Luster PHARMD 2309 09/19/17 HMILES    Enterobacter cloacae complex NOT DETECTED NOT DETECTED Final   Escherichia coli NOT DETECTED NOT DETECTED Final   Klebsiella oxytoca NOT DETECTED NOT DETECTED Final   Klebsiella pneumoniae DETECTED (A) NOT DETECTED Final    Comment: CRITICAL RESULT CALLED TO, READ BACK BY AND VERIFIED WITH: J.GRIMSLEY PHARMD 2309 09/19/17 HMILES    Proteus species NOT DETECTED NOT DETECTED Final   Serratia marcescens NOT DETECTED NOT DETECTED Final   Carbapenem resistance NOT DETECTED NOT DETECTED Final   Haemophilus influenzae NOT DETECTED NOT DETECTED Final   Neisseria meningitidis NOT DETECTED NOT DETECTED Final   Pseudomonas aeruginosa NOT DETECTED NOT DETECTED Final   Candida albicans NOT DETECTED NOT DETECTED Final   Candida glabrata NOT DETECTED NOT DETECTED Final   Candida krusei NOT DETECTED NOT DETECTED Final   Candida parapsilosis NOT DETECTED NOT DETECTED Final   Candida tropicalis NOT DETECTED NOT DETECTED Final     Comment: Performed at [facility], [place], [place], [place] [address]  Urine Culture     Status: Abnormal   Collection Time: 09/19/17 10:17 AM  Result Value Ref Range Status   Specimen Description   Final    URINE, CATHETERIZED CYSTO Performed at [facility] [address] [place], [place] [address]    Special Requests   Final    NONE Performed at [facility], [place] [address] [place], [place] [address]    Culture (A)  Final    <10,000 COLONIES/mL INSIGNIFICANT GROWTH Performed at [facility] [address], [place], [place] [address]    Report Status 09/21/2017 FINAL  Final  Urine Culture     Status: None   Collection Time: 09/22/17  1:55 PM  Result Value Ref Range Status   Specimen Description   Final    URINE, RANDOM Performed at [facility] [address], [place], [place] [address]    Special Requests   Final    NEPHROSTOMY Performed at [facility], [place] [address], [place], [place] [address]    Culture   Final    NO GROWTH Performed at [facility], [place] [address] [place], [place] [address]    Report Status 09/24/2017 FINAL  Final     Liver Function Tests: Recent Labs  Lab 09/18/17 2349  AST 31  ALT 50  ALKPHOS 120  BILITOT 0.8  PROT 6.7  ALBUMIN 2.7*   Recent Labs  Lab 09/18/17 2349  LIPASE 21   No results for input(s): AMMONIA in the last 168 hours.  Cardiac Enzymes: No results for input(s): CKTOTAL, CKMB, CKMBINDEX, TROPONINI in the last 168 hours. BNP (last 3 results) Recent Labs  09/19/17 1316  BNP 251.6*    ProBNP (last 3 results) No results for input(s): PROBNP in the last 8760 hours.    Studies: No results found.  Scheduled Meds: . levothyroxine  200 mcg Oral QAC breakfast  . loratadine  10 mg Oral Daily  . sodium bicarbonate  650 mg Oral TID  . vitamin C  1,000 mg Oral Daily  . Vitamin D (Ergocalciferol)  50,000  Units Oral Q Sun      Time spent: 20 min  [facility] Pager [phone]. If 7PM-7AM, please contact night-coverage at [email or web address], Office  [phone]  password [email or web address]  09/25/2017, 11:12 AM  LOS: 6 days

## 2017-09-25 NOTE — Plan of Care (Signed)
  Nutrition: Adequate nutrition will be maintained 09/25/2017 1558 - Progressing by Dorene Sorrow, RN   Pain Managment: General experience of comfort will improve 09/25/2017 1558 - Progressing by Dorene Sorrow, RN   Safety: Ability to remain free from injury will improve 09/25/2017 1558 - Progressing by Dorene Sorrow, RN

## 2017-09-25 NOTE — Progress Notes (Signed)
09/25/17  1000  Patient refuses to wear slip resistant (yellow) socks and SCD's. Patient states she prefer to walk bare foot to the bathroom. Pt advised the sock are to keep her from falling and her bare feet are more slippery. Patient still decline to wear socks. Pt states SCD's make her legs burn. Pt advised that the SCD's are a preventative measure to help decrease her chances of a blood clot. Pt declines SCD's.

## 2017-09-26 DIAGNOSIS — R1111 Vomiting without nausea: Secondary | ICD-10-CM

## 2017-09-26 LAB — BASIC METABOLIC PANEL
ANION GAP: 16 — AB (ref 5–15)
BUN: 70 mg/dL — ABNORMAL HIGH (ref 6–20)
CHLORIDE: 105 mmol/L (ref 101–111)
CO2: 22 mmol/L (ref 22–32)
CREATININE: 4.07 mg/dL — AB (ref 0.44–1.00)
Calcium: 8.1 mg/dL — ABNORMAL LOW (ref 8.9–10.3)
GFR calc non Af Amer: 11 mL/min — ABNORMAL LOW (ref >60)
GFR, EST AFRICAN AMERICAN: 13 mL/min — AB (ref >60)
Glucose, Bld: 148 mg/dL — ABNORMAL HIGH (ref 65–99)
POTASSIUM: 3.7 mmol/L (ref 3.5–5.1)
SODIUM: 143 mmol/L (ref 135–145)

## 2017-09-26 MED ORDER — CEPHALEXIN 500 MG PO CAPS: 500.0000 mg | ORAL_CAPSULE | Freq: Two times a day (BID) | ORAL | 0 refills | Status: AC

## 2017-09-26 MED ORDER — LEVOTHYROXINE SODIUM 200 MCG PO TABS: 200.0000 ug | ORAL_TABLET | Freq: Every day | ORAL | 2 refills | Status: DC

## 2017-09-26 MED ORDER — POLYETHYLENE GLYCOL 3350 17 G PO PACK: 17.0000 g | PACK | Freq: Every day | ORAL | 0 refills | Status: AC | PRN

## 2017-09-26 MED ORDER — SODIUM BICARBONATE 650 MG PO TABS: 650.0000 mg | ORAL_TABLET | Freq: Three times a day (TID) | ORAL | 0 refills | Status: DC

## 2017-09-26 MED ORDER — HYDROCODONE-ACETAMINOPHEN 5-325 MG PO TABS: 1.0000 | ORAL_TABLET | Freq: Four times a day (QID) | ORAL | 0 refills | Status: DC | PRN

## 2017-09-26 NOTE — Plan of Care (Signed)
  Pain Managment: General experience of comfort will improve 09/26/2017 0125 - Progressing by Mickie Kay, RN

## 2017-09-26 NOTE — Plan of Care (Signed)
  Pain Managment: General experience of comfort will improve 09/26/2017 0128 - Progressing by Mickie Kay, RN 09/26/2017 0125 - Progressing by Mickie Kay, RN

## 2017-09-26 NOTE — Discharge Summary (Signed)
Physician Discharge Summary  Sheila Leach PNT:[id number] DOB: 12-25-1955 DOA: 09/19/2017  PCP: Shon Baton, MD  Admit date: 09/19/2017 Discharge date: 09/26/2017  Time spent: 40* minutes  Recommendations for Outpatient Follow-up:  1. Follow up Urology in one week 2. Follow up PCP to check  BMP in one week, TSH in 6 weeks   Discharge Diagnoses:  Active Problems:   AKI (acute kidney injury) ([place])   HTN (hypertension)   Hydronephrosis with renal and ureteral calculus obstruction   Hematuria   Acute lower UTI   UTI (urinary tract infection)   Discharge Condition: Stable  Diet recommendation: Heart healthy diet  Filed Weights   09/18/17 2211  Weight: 127.9 kg (282 lb)    History of present illness:  62 y.o.femalewith PMH of DJD, HTN, Asthma, Hypothyroidism, CKD, Nephrolithiasis, presented with nausea, vomiting, diarrhea associated with flank pains for several days. She has developed worsening back pains with hematuria last night and presented to emergency department for further evaluation. No hematemesis, no hematochezia, no acute abdominal pains but had mild lower abdominal discomfort , no acute chest pains or shortness of breath. She had mild productive cough, no wheezing. He reports some chills no fevers. She reports taking nsaids for few days   ED: CT abd showed hydronephrosis, creatinine 8.7, previously at 1.4. ED d/w urology, S/p right ureteral stent placement 2/17, L PCN placement       Hospital Course:  1. 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 Patient underwent cystoscopy with right sided JJs stent placement on September 19, 2017, along with left sided percutaneous nephrostomy  placement  by IR on September 20, 2017. Renal function is slowly improving. Called and  discussed with nephrologist Dr. Benjamine Sprague, who recommends to continue IV fluids and monitoring renal function until creatinine improves around 4.0. Today BUN/creatinine is 70/4.07. Will  Discharge home, can follow up PCP in one week and check BMP  2. Hypokalemia-potassium is 3.3, will replace potassium and check BMP in am 3. Epistaxis-resolved, patient developed epistaxis s last night. Heparin has been discontinued. BUN did go up a little due to swallowing of blood. 4. UTI/pyelonephritis-urine and blood culture obtained on September 18, 2017 grew Klebsiella pneumoniae, sensitive to ceftriaxone. Today is day#8 of Ceftriaxone, will discharge home on Po Keflex 500 mg po bid x 6 more days. 5. Acute kidney injury-patient presented with creatinine of 8.7, baseline creatinine around 1.4. Secondary to obstructive neuropathy as above. Currently on gentle IV hydration with normal saline at 50 ML per hour. Creatinine is slowly improving, today creatinine is 4.07. Continue sodium bicarbonate tablets three times a day. Follow up BMP in one week as outpatient. 6. Chronic asthma- no signs and symptoms of exacerbation. Continue PRN bronchodilators. 7. Hypothyroidism-TSH was elevated and T3 was low, Synthroid dose increased to 200 g daily. Repeat TSH along with T-3 and free T 4 in  4 to 6 weeks as outpatient 8. Hypertension-lisinopril currently on hold due to above. Blood pressure is stable. 9. Nausea vomiting and diarrhea-the setting of UTI, kidney stones. Improved with supportive care. 10. Dyspnea-patient developed mild fluid overload on February 1919, required IV Lasix. Echocardiogram showed ejection fraction in the range of 50 to 55%. Breathing is stable.     Procedures: Nephrostomy tube placement by IR. On 09/20/2017  Cystoscopy with right-sided double-J stent placement on 2/17/2019by urology  Consultations:  Urology  IR  Discharge Exam: Vitals:   09/25/17 2048 09/26/17 0458  BP: 133/69 119/86   Pulse: 95 86  Resp: 18 18  Temp: 98.2 F (36.8 C) 98.2 F (36.8 C)  SpO2: 96% 100%    General: Appears in no acute distress Cardiovascular: S1s2 RRR Respiratory: Clear bilaterally  Discharge Instructions    Allergies as of 09/26/2017      Reactions   Band-aid Plus Antibiotic [bacitracin-polymyxin B]    Plastic band-aids cause redness   Iodine Swelling   Face and lips      Medication List    STOP taking these medications   acetaminophen 325 MG tablet Commonly known as:  TYLENOL   ibuprofen 200 MG tablet Commonly known as:  ADVIL,MOTRIN   lisinopril 20 MG tablet Commonly known as:  PRINIVIL,ZESTRIL     TAKE these medications   albuterol 108 (90 Base) MCG/ACT inhaler Commonly known as:  PROVENTIL HFA;VENTOLIN HFA Inhale 2 puffs into the lungs every 6 (six) hours as needed for wheezing or shortness of breath.   cephALEXin 500 MG capsule Commonly known as:  KEFLEX Take 1 capsule (500 mg total) by mouth 2 (two) times daily for 6 days.   cetirizine 10 MG tablet Commonly known as:  ZYRTEC Take 10 mg by mouth daily.   ergocalciferol 50000 units capsule Commonly known as:  VITAMIN D2 Take 50,000 Units by mouth every Sunday.   fenofibrate 160 MG tablet Take 160 mg by mouth daily.   HYDROcodone-acetaminophen 5-325 MG tablet Commonly known as:  NORCO/VICODIN Take 1 tablet by mouth every 6 (six) hours as needed for moderate pain.   levothyroxine 200 MCG tablet Commonly known as:  SYNTHROID, LEVOTHROID Take 1 tablet (200 mcg total) by mouth daily before breakfast. Start taking on:  09/27/2017 What changed:    medication strength  how much to take   multivitamin with minerals tablet Take 1 tablet by mouth daily.   polyethylene glycol packet Commonly known as:  MIRALAX / GLYCOLAX Take 17 g by mouth daily as needed for moderate constipation.   sodium bicarbonate 650 MG tablet Take 1 tablet (650 mg total) by mouth 3 (three) times daily.   vitamin C 1000  MG tablet Take 1,000 mg by mouth daily.      Allergies  Allergen Reactions  . Band-Aid Plus Antibiotic [Bacitracin-Polymyxin B]     Plastic band-aids cause redness  . Iodine Swelling    Face and lips    Follow-up Information    Cleon Gustin, MD Follow up in 1 week(s).   Specialty:  Urology Contact information: [address] [place] [address] [phone]            The results of significant diagnostics from this hospitalization (including imaging, microbiology, ancillary and laboratory) are listed below for reference.    Significant Diagnostic Studies: Ct Abdomen Pelvis Wo Contrast  Result Date: 09/19/2017 CLINICAL DATA:  Abdominal pain and hematuria. EXAM: CT ABDOMEN AND PELVIS WITHOUT CONTRAST TECHNIQUE: Multidetector CT imaging of the abdomen and pelvis was performed following the standard protocol without IV contrast. COMPARISON:  CT 09/25/2014 FINDINGS: Lower chest: Upper normal heart size. Bilateral lower lobe atelectasis, breathing motion artifact. Hepatobiliary: The liver is enlarged spanning 22.5 cm with diffuse steatosis. No evidence of focal lesion allowing for lack contrast. Possible sludge in the gallbladder which is physiologically distended. No calcified stone. No biliary dilatation. Pancreas: No ductal dilatation or inflammation. Spleen: Enlarged measuring 16.3 cm AP. Adrenals/Urinary Tract:  No adrenal nodule. Obstructing 7 mm stone in the distal right ureter just proximal to the ureterovesicular junction with mild right hydroureteronephrosis and perinephric edema. Two additional nonobstructing stones in the upper right kidney. Probable cyst in the mid right kidney. Chronic 15 mm stone at the left ureteropelvic junction with chronic severe hydronephrosis. Progressive parenchymal thinning from prior exam. No left perinephric edema. Nonobstructing stone versus parenchymal calcification in the mid left kidney. Urinary bladder is completely decompressed.  Stomach/Bowel: Multifocal colonic diverticulosis without diverticulitis. Normal appendix. No bowel obstruction, inflammation or wall thickening. Vascular/Lymphatic: Aortic atherosclerosis. Retroaortic left renal vein. Increased number of multiple small retroperitoneal lymph nodes. Largest node measures 11 mm short axis in the pericaval region at the level of the right kidney. Small periportal nodes are likely reactive. Reproductive: Status post hysterectomy. No adnexal masses. Other: No free air, free fluid, or intra-abdominal fluid collection. Musculoskeletal: Multilevel degenerative change throughout the lumbar spine. There are no acute or suspicious osseous abnormalities. IMPRESSION: 1. Obstructing 7 mm stone in the distal right ureter just proximal to the ureteropelvic junction with mild hydroureteronephrosis and perinephric edema. 2. 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 3. Additional nonobstructing right renal calculi. 4. Hepatosplenomegaly and hepatic steatosis. 5. Colonic diverticulosis without diverticulitis. 6.  Aortic Atherosclerosis (ICD10-I70.0). Electronically Signed   By: Jeb Levering M.D.   On: 09/19/2017 05:42   Dg Chest 2 View  Result Date: 09/19/2017 CLINICAL DATA:  Nausea. EXAM: CHEST  2 VIEW COMPARISON:  06/24/2014 FINDINGS: Very low lung volumes on the AP view limit assessment. The heart is enlarged. Bronchovascular crowding with peribronchial cuffing. No confluent airspace disease. No large pleural effusion. No pneumothorax. Body habitus limits detailed assessment. Chronic change about the left shoulder. IMPRESSION: Cardiomegaly. Peribronchial cuffing favored to be bronchial inflammation over pulmonary edema. Electronically Signed   By: Jeb Levering M.D.   On: 09/19/2017 02:57   Dg Chest Port 1 View  Result Date: 09/21/2017 CLINICAL DATA:  Followup shortness of Breath  EXAM: PORTABLE CHEST 1 VIEW COMPARISON:  09/19/2017 FINDINGS: Slight improvement in aeration. Mild residual right base atelectasis. No focal opacity on the left. Heart is mildly enlarged. Suspect small layering right effusion. IMPRESSION: Small right effusion with right base atelectasis. Some improvement in aeration since prior study. Electronically Signed   By: Rolm Baptise M.D.   On: 09/21/2017 09:46   Dg C-arm 1-60 Min-no Report  Result Date: 09/19/2017 Fluoroscopy was utilized by the requesting physician.  No radiographic interpretation.   Ir Nephrostomy Placement Left  Result Date: 09/20/2017 INDICATION: 62 year old female with bilateral obstructed hydronephrosis secondary to stone disease. Double-J stent placed successfully on the right yesterday. Unable to place internal stent on the left. Patient presents for percutaneous nephrostomy placement. EXAM: IR NEPHROSTOMY PLACEMENT LEFT COMPARISON:  CT scan of the abdomen and pelvis 09/19/2017 MEDICATIONS: 2 g Ancef; The antibiotic was administered in an appropriate time frame prior to skin puncture. ANESTHESIA/SEDATION: Fentanyl 1 mcg IV; Versed 50 mg IV Moderate Sedation Time:  10 minutes The patient was continuously monitored during the procedure by the interventional radiology nurse under my direct supervision. CONTRAST:  15 mL Isovue 370-administered into the collecting system(s) FLUOROSCOPY TIME:  Fluoroscopy Time: 0 minutes 30 seconds (31 mGy). COMPLICATIONS: None immediate. TECHNIQUE: The procedure, risks, benefits, and alternatives were explained to the patient. Questions regarding the procedure were encouraged and answered. The patient understands and consents to the procedure.  The left flank was prepped with chlorhexidine in a sterile fashion, and a sterile drape was applied covering the operative field. A sterile gown and sterile gloves were used for the procedure. Local anesthesia was provided with 1% Lidocaine. The left flank was interrogated  with ultrasound and the left kidney identified. The kidney is hydronephrotic. A suitable access site on the skin overlying the lower pole, posterior calix was identified. After local mg anesthesia was achieved, a small skin nick was made with an 11 blade scalpel. A 21 gauge Accustick needle was then advanced under direct sonographic guidance into the lower pole of the left kidney. A 0.018 inch wire was advanced under fluoroscopic guidance into the left renal collecting system. The Accustick sheath was then advanced over the wire and a 0.018 system exchanged for a 0.035 system. Gentle hand injection of contrast material confirms placement of the sheath within the renal collecting system. There is marked hydronephrosis. The tract from the scan into the renal collecting system was then dilated serially to 10-French. A 10-French Cook all-purpose drain was then placed and positioned under fluoroscopic guidance. The locking loop is well formed within the left renal pelvis. The catheter was secured to the skin with 2-0 Prolene and a sterile bandage was placed. Catheter was left to gravity bag drainage. IMPRESSION: Successful placement of a left 10 French percutaneous nephrostomy tube. PLAN: 1. Maintain tube to gravity bag drainage. 2. Continue to trend creatinine levels. 3. Return to interventional Radiology in 6 weeks for tube check and change. An attempt at internalization to a percutaneous nephroureteral tube, or an internal double-J ureteral stent could be considered at that time. Signed, Criselda Peaches, MD [facility] Radiology Electronically Signed   By: Jacqulynn Cadet M.D.   On: 09/20/2017 13:43    Microbiology: Recent Results (from the past 240 hour(s))  Culture, Urine     Status: Abnormal   Collection Time: 09/18/17 11:30 PM  Result Value Ref Range Status   Specimen Description   Final    URINE, CLEAN CATCH Performed at [facility] [address] [place], [place] [address]    Special Requests   Final    catheterize if needed Performed at [facility], [place] [address] [place], [place] [address]    Culture >=100,000 COLONIES/mL KLEBSIELLA PNEUMONIAE (A)  Final   Report Status 09/21/2017 FINAL  Final   Organism ID, Bacteria KLEBSIELLA PNEUMONIAE (A)  Final      Susceptibility   Klebsiella pneumoniae - MIC*    AMPICILLIN >=32 RESISTANT Resistant     CEFAZOLIN <=4 SENSITIVE Sensitive     CEFTRIAXONE <=1 SENSITIVE Sensitive     CIPROFLOXACIN >=4 RESISTANT Resistant     GENTAMICIN <=1 SENSITIVE Sensitive     IMIPENEM <=0.25 SENSITIVE Sensitive     NITROFURANTOIN 128 RESISTANT Resistant     TRIMETH/SULFA >=320 RESISTANT Resistant     AMPICILLIN/SULBACTAM 4 SENSITIVE Sensitive     PIP/TAZO <=4 SENSITIVE Sensitive     Extended ESBL NEGATIVE Sensitive     * >=100,000 COLONIES/mL KLEBSIELLA PNEUMONIAE  Blood Culture (routine x 2)     Status: Abnormal   Collection Time: 09/19/17  1:35 AM  Result Value Ref Range Status   Specimen Description   Final    BLOOD RIGHT ANTECUBITAL Performed at [facility] [address] [place], [place] [address]    Special Requests   Final    BOTTLES DRAWN AEROBIC AND  ANAEROBIC Blood Culture adequate volume Performed at [facility] [address], [place], [place] [address]    Culture  Setup Time   Final    IN BOTH AEROBIC AND ANAEROBIC BOTTLES CRITICAL VALUE NOTED.  VALUE IS CONSISTENT WITH PREVIOUSLY REPORTED AND CALLED VALUE. GRAM NEGATIVE RODS    Culture (A)  Final    KLEBSIELLA PNEUMONIAE SUSCEPTIBILITIES PERFORMED ON PREVIOUS CULTURE WITHIN THE LAST 5 DAYS. Performed at [facility], [place] [address] [place], [place] [address]    Report Status 09/21/2017 FINAL  Final  Blood Culture (routine x 2)     Status: Abnormal   Collection Time: 09/19/17  1:35 AM  Result Value Ref Range Status   Specimen  Description   Final    BLOOD LEFT ANTECUBITAL Performed at [facility] [address], [place], [place] [address]    Special Requests   Final    BOTTLES DRAWN AEROBIC AND ANAEROBIC Blood Culture adequate volume Performed at [facility] [address] [place], [place] [address]    Culture  Setup Time   Final    GRAM NEGATIVE RODS IN BOTH AEROBIC AND ANAEROBIC BOTTLES CRITICAL RESULT CALLED TO, READ BACK BY AND VERIFIED WITH: Lavell Luster [facility] 4944 09/19/17 HMILES Performed at [facility], [place] [address] [place], [place] [address]    Culture KLEBSIELLA PNEUMONIAE (A)  Final   Report Status 09/21/2017 FINAL  Final   Organism ID, Bacteria KLEBSIELLA PNEUMONIAE  Final      Susceptibility   Klebsiella pneumoniae - MIC*    AMPICILLIN >=32 RESISTANT Resistant     CEFAZOLIN <=4 SENSITIVE Sensitive     CEFEPIME <=1 SENSITIVE Sensitive     CEFTAZIDIME <=1 SENSITIVE Sensitive     CEFTRIAXONE <=1 SENSITIVE Sensitive     CIPROFLOXACIN >=4 RESISTANT Resistant     GENTAMICIN <=1 SENSITIVE Sensitive     IMIPENEM <=0.25 SENSITIVE Sensitive     TRIMETH/SULFA >=320 RESISTANT Resistant     AMPICILLIN/SULBACTAM 4 SENSITIVE Sensitive     PIP/TAZO <=4 SENSITIVE Sensitive     Extended ESBL NEGATIVE Sensitive     * KLEBSIELLA PNEUMONIAE  Blood Culture ID Panel (Reflexed)     Status: Abnormal   Collection Time: 09/19/17  1:35 AM  Result Value Ref Range Status   Enterococcus species NOT DETECTED NOT DETECTED Final   Listeria monocytogenes NOT DETECTED NOT DETECTED Final   Staphylococcus species NOT DETECTED NOT DETECTED Final   Staphylococcus aureus NOT DETECTED NOT DETECTED Final   Streptococcus species NOT DETECTED NOT DETECTED Final   Streptococcus agalactiae NOT DETECTED NOT DETECTED Final   Streptococcus pneumoniae NOT DETECTED NOT DETECTED Final   Streptococcus pyogenes NOT DETECTED NOT DETECTED Final   Acinetobacter baumannii NOT DETECTED NOT  DETECTED Final   Enterobacteriaceae species DETECTED (A) NOT DETECTED Final    Comment: Enterobacteriaceae represent a large family of gram-negative bacteria, not a single organism. CRITICAL RESULT CALLED TO, READ BACK BY AND VERIFIED WITH: Lavell Luster PHARMD [id number] 09/19/17 HMILES    Enterobacter cloacae complex NOT DETECTED NOT DETECTED Final   Escherichia coli NOT DETECTED NOT DETECTED Final   Klebsiella oxytoca NOT DETECTED NOT DETECTED Final   Klebsiella pneumoniae DETECTED (A) NOT DETECTED Final    Comment: CRITICAL RESULT CALLED TO, READ BACK BY AND VERIFIED WITH: J.GRIMSLEY PHARMD [id number] 09/19/17 HMILES    Proteus species NOT DETECTED NOT DETECTED Final   Serratia marcescens NOT DETECTED NOT DETECTED Final  Carbapenem resistance NOT DETECTED NOT DETECTED Final   Haemophilus influenzae NOT DETECTED NOT DETECTED Final   Neisseria meningitidis NOT DETECTED NOT DETECTED Final   Pseudomonas aeruginosa NOT DETECTED NOT DETECTED Final   Candida albicans NOT DETECTED NOT DETECTED Final   Candida glabrata NOT DETECTED NOT DETECTED Final   Candida krusei NOT DETECTED NOT DETECTED Final   Candida parapsilosis NOT DETECTED NOT DETECTED Final   Candida tropicalis NOT DETECTED NOT DETECTED Final    Comment: Performed at [facility], [place] [address], [place], [place] [address]  Urine Culture     Status: Abnormal   Collection Time: 09/19/17 10:17 AM  Result Value Ref Range Status   Specimen Description   Final    URINE, CATHETERIZED CYSTO Performed at [facility] [address] [place], [place] [address]    Special Requests   Final    NONE Performed at [facility], [place] [address] [place], [place] [address]    Culture (A)  Final    <10,000 COLONIES/mL INSIGNIFICANT GROWTH Performed at [facility] [address] [place], [place] [address]    Report Status 09/21/2017 FINAL  Final  Urine Culture     Status: None   Collection Time:  09/22/17  1:55 PM  Result Value Ref Range Status   Specimen Description   Final    URINE, RANDOM Performed at [facility] [address], [place], [place] [address]    Special Requests   Final    NEPHROSTOMY Performed at [facility], [place] [address], [place], [place] [address]    Culture   Final    NO GROWTH Performed at [facility], [place] [address] [place],  [address]    Report Status 09/24/2017 FINAL  Final     Labs: Basic Metabolic Panel: Recent Labs  Lab 09/22/17 0615 09/23/17 8206 09/24/17 0601 09/25/17 0620 09/26/17 0640  NA 140 142 142 141 143  K 3.6 3.3* 3.4* 3.3* 3.7  CL 100* 99* 103 101 105  CO2 21* 24 24 24 22   GLUCOSE 151* 158* 151* 204* 148*  BUN 113* 126* 109* 86* 70*  CREATININE 7.46* 6.36* 5.39* 4.57* 4.07*  CALCIUM 8.4* 8.7* 8.8* 8.5* 8.1*  MG 1.8  --   --   --   --    Liver Function Tests: No results for input(s): AST, ALT, ALKPHOS, BILITOT, PROT, ALBUMIN in the last 168 hours. No results for input(s): LIPASE, AMYLASE in the last 168 hours. No results for input(s): AMMONIA in the last 168 hours. CBC: Recent Labs  Lab 09/20/17 0555 09/21/17 0514 09/22/17 0615 09/23/17 0605  WBC 9.6 12.4* 15.9* 14.9*  HGB 10.7* 11.5* 11.9* 11.4*  HCT 33.4* 35.8* 36.1 35.4*  MCV 92.3 91.8 90.0 91.2  PLT 166 163 195 188   Cardiac Enzymes: No results for input(s): CKTOTAL, CKMB, CKMBINDEX, TROPONINI in the last 168 hours. BNP: BNP (last 3 results) Recent Labs    09/19/17 1316  BNP 251.6*        Signed:  Oswald Hillock MD.  [facility] 09/26/2017, 11:13 AM

## 2017-10-01 ENCOUNTER — Other Ambulatory Visit: Payer: Self-pay | Admitting: Urology

## 2017-10-11 DIAGNOSIS — E079 Disorder of thyroid, unspecified: Secondary | ICD-10-CM | POA: Insufficient documentation

## 2017-10-11 DIAGNOSIS — Z8541 Personal history of malignant neoplasm of cervix uteri: Secondary | ICD-10-CM | POA: Diagnosis not present

## 2017-10-11 DIAGNOSIS — J45909 Unspecified asthma, uncomplicated: Secondary | ICD-10-CM | POA: Diagnosis not present

## 2017-10-11 DIAGNOSIS — Z923 Personal history of irradiation: Secondary | ICD-10-CM | POA: Insufficient documentation

## 2017-10-11 DIAGNOSIS — R110 Nausea: Secondary | ICD-10-CM | POA: Diagnosis not present

## 2017-10-11 DIAGNOSIS — R1032 Left lower quadrant pain: Secondary | ICD-10-CM | POA: Diagnosis present

## 2017-10-11 DIAGNOSIS — Z79899 Other long term (current) drug therapy: Secondary | ICD-10-CM | POA: Diagnosis not present

## 2017-10-11 DIAGNOSIS — Z8542 Personal history of malignant neoplasm of other parts of uterus: Secondary | ICD-10-CM | POA: Diagnosis not present

## 2017-10-11 DIAGNOSIS — R509 Fever, unspecified: Secondary | ICD-10-CM | POA: Insufficient documentation

## 2017-10-11 DIAGNOSIS — I10 Essential (primary) hypertension: Secondary | ICD-10-CM | POA: Insufficient documentation

## 2017-10-12 ENCOUNTER — Emergency Department (HOSPITAL_COMMUNITY): Payer: Medicare Other

## 2017-10-12 ENCOUNTER — Emergency Department (HOSPITAL_COMMUNITY)
Admission: EM | Admit: 2017-10-12 | Discharge: 2017-10-12 | Disposition: A | Discharge status: Home / Self Care | Payer: Medicare Other | Attending: Emergency Medicine | Admitting: Emergency Medicine

## 2017-10-12 DIAGNOSIS — R509 Fever, unspecified: Secondary | ICD-10-CM

## 2017-10-12 LAB — CBC
HCT: 32.1 % — ABNORMAL LOW (ref 36.0–46.0)
HEMOGLOBIN: 10 g/dL — AB (ref 12.0–15.0)
MCH: 29.2 pg (ref 26.0–34.0)
MCHC: 31.2 g/dL (ref 30.0–36.0)
MCV: 93.6 fL (ref 78.0–100.0)
PLATELETS: 465 10*3/uL — AB (ref 150–400)
RBC: 3.43 MIL/uL — AB (ref 3.87–5.11)
RDW: 13.9 % (ref 11.5–15.5)
WBC: 15.1 10*3/uL — ABNORMAL HIGH (ref 4.0–10.5)

## 2017-10-12 LAB — COMPREHENSIVE METABOLIC PANEL
ALK PHOS: 77 U/L (ref 38–126)
ALT: 28 U/L (ref 14–54)
ANION GAP: 11 (ref 5–15)
AST: 30 U/L (ref 15–41)
Albumin: 3.5 g/dL (ref 3.5–5.0)
BILIRUBIN TOTAL: 0.6 mg/dL (ref 0.3–1.2)
BUN: 35 mg/dL — ABNORMAL HIGH (ref 6–20)
CO2: 22 mmol/L (ref 22–32)
Calcium: 10.5 mg/dL — ABNORMAL HIGH (ref 8.9–10.3)
Chloride: 105 mmol/L (ref 101–111)
Creatinine, Ser: 3.84 mg/dL — ABNORMAL HIGH (ref 0.44–1.00)
GFR, EST AFRICAN AMERICAN: 13 mL/min — AB (ref >60)
GFR, EST NON AFRICAN AMERICAN: 12 mL/min — AB (ref >60)
Glucose, Bld: 133 mg/dL — ABNORMAL HIGH (ref 65–99)
Potassium: 4.2 mmol/L (ref 3.5–5.1)
Sodium: 138 mmol/L (ref 135–145)
TOTAL PROTEIN: 7.8 g/dL (ref 6.5–8.1)

## 2017-10-12 LAB — I-STAT CG4 LACTIC ACID, ED: LACTIC ACID, VENOUS: 1.86 mmol/L (ref 0.5–1.9)

## 2017-10-12 LAB — LIPASE, BLOOD: Lipase: 43 U/L (ref 11–51)

## 2017-10-12 NOTE — Discharge Instructions (Signed)
Please be sure to monitor your condition carefully, and follow-up with your urology team.  Please use the prescribed antibiotic as directed.  Return here for concerning changes.

## 2017-10-12 NOTE — ED Triage Notes (Signed)
Pt was recently seen for septicemia caused by severe UTI and kidney stones. Pt had a stint put in at that time. Pt was hospitalized for a week at that time. Pt still has a nephrostomy tube in at this time. Pt was running a fever earlier today and vomiting with associated nausea and chills. Pt was advised by her doctor to come to the ER. While son was changing her bandage on her nephrostomy tube, site was noted to be red and puffy and pt reports that she feels a pulling sensation in that area.

## 2017-10-12 NOTE — ED Provider Notes (Signed)
[place] DEPT Provider Note   CSN: [id number] Arrival date & time: 10/11/17  2300     History   Chief Complaint Chief Complaint  Patient presents with  . Abdominal Pain    HPI Sheila Leach is a 62 y.o. female.  HPI Patient presents with concern of left lower quadrant abdominal pain. She has a history of IBS, but does not typically have persistent pain. Over the past few days she has had episodes of sharp, crampy pain in the left lower quadrant, and now over the past 12 hours the discomfort has been persistent. Pain is nonradiating. Pain is not improved with OTC medication. Patient has inconsistent bowel habits at baseline, and is unable to ascertain changes. No vomiting, there is nausea and anorexia. No fever, no chest pain, no dyspnea or other complaints. Patient is here with her son who assists with the HPI. Past Medical History:  Diagnosis Date  . Arthritis    back  . Asthma   . Back injury   . Cancer ([place])    uterine  . Endometrial cancer ([place])    last radiation in 2010/no chemo  . Hx of radiation therapy 6/6/, 6/21, 6/24, 02/07/2009   brachytherapy  . Hyperlipidemia   . Hypertension   . Kidney stone    2016  did not pass  . Osteoporosis   . Personal history of colonic adenomas 01/24/2013  . Seasonal allergies   . Thyroid disease   . Weakness    left sided weakness    Patient Active Problem List   Diagnosis Date Noted  . AKI (acute kidney injury) ([place]) 09/19/2017  . HTN (hypertension) 09/19/2017  . Hydronephrosis with renal and ureteral calculus obstruction 09/19/2017  . Hematuria 09/19/2017  . Acute lower UTI 09/19/2017  . UTI (urinary tract infection) 09/19/2017  . Endometrial cancer ([place])   . Personal history of colonic adenomas 01/24/2013  . Malignant neoplasm of corpus uteri, except isthmus ([place]) 03/02/2012    Past Surgical History:  Procedure Laterality Date  . ABDOMINAL HYSTERECTOMY     Total  . CYSTOSCOPY W/  URETERAL STENT PLACEMENT Bilateral 09/19/2017   Procedure: CYSTOSCOPY WITH BILATERAL RETROGRADE PYELOGRAM/RIGHT URETERAL STENT PLACEMENT;  Surgeon: Cleon Gustin, MD;  Location: [facility];  Service: Urology;  Laterality: Bilateral;  . DILATION AND CURETTAGE OF UTERUS     fibroids  . IR NEPHROSTOMY PLACEMENT LEFT  09/20/2017  . THYROIDECTOMY     childhood    OB History    No data available       Home Medications    Prior to Admission medications   Medication Sig Start Date End Date Taking? Authorizing Provider  acetaminophen (TYLENOL) 500 MG tablet Take 1,000 mg by mouth every 6 (six) hours as needed for moderate pain.   Yes [provider]  albuterol (PROVENTIL HFA;VENTOLIN HFA) 108 (90 BASE) MCG/ACT inhaler Inhale 2 puffs into the lungs every 6 (six) hours as needed for wheezing or shortness of breath.   Yes [provider]  Ascorbic Acid (VITAMIN C) 1000 MG tablet Take 1,000 mg by mouth daily.    Yes [provider]  cetirizine (ZYRTEC) 10 MG tablet Take 10 mg by mouth daily.   Yes [provider]  ergocalciferol (VITAMIN D2) 50000 UNITS capsule Take 50,000 Units by mouth every Sunday.    Yes [provider]  fenofibrate 160 MG tablet Take 160 mg by mouth daily. 09/15/17  Yes [provider]  HYDROcodone-acetaminophen (NORCO/VICODIN) 5-325  MG tablet Take 1 tablet by mouth every 6 (six) hours as needed for moderate pain. 09/26/17  Yes Oswald Hillock, MD  levothyroxine (SYNTHROID, LEVOTHROID) 200 MCG tablet Take 1 tablet (200 mcg total) by mouth daily before breakfast. 09/27/17  Yes Darrick Meigs, Marge Duncans, MD  Multiple Vitamins-Minerals (MULTIVITAMIN WITH MINERALS) tablet Take 1 tablet by mouth daily.   Yes [provider]  polyethylene glycol (MIRALAX / GLYCOLAX) packet Take 17 g by mouth daily as needed for moderate constipation. 09/26/17  Yes Oswald Hillock, MD  sodium bicarbonate 650 MG tablet Take 1 tablet (650 mg total) by mouth 3  (three) times daily. Patient not taking: Reported on 10/12/2017 09/26/17   Oswald Hillock, MD    Family History Family History  Problem Relation Age of Onset  . Heart disease Mother   . Diabetes Mother   . Kidney disease Mother   . Heart disease Father   . Thyroid cancer Sister   . Esophageal cancer Brother   . Diabetes Brother   . Liver disease Brother   . Colon cancer Neg Hx   . Rectal cancer Neg Hx   . Stomach cancer Neg Hx     Social History Social History   Tobacco Use  . Smoking status: Never Smoker  . Smokeless tobacco: Never Used  Substance Use Topics  . Alcohol use: No  . Drug use: No     Allergies   Band-aid plus antibiotic [bacitracin-polymyxin b] and Iodine   Review of Systems Review of Systems  Constitutional:       Per HPI, otherwise negative  HENT:       Per HPI, otherwise negative  Respiratory:       Per HPI, otherwise negative  Cardiovascular:       Per HPI, otherwise negative  Gastrointestinal: Positive for abdominal pain and nausea. Negative for vomiting.  Endocrine:       Negative aside from HPI  Genitourinary:       Neg aside from HPI   Musculoskeletal:       Per HPI, otherwise negative  Skin: Negative.   Neurological: Negative for syncope.     Physical Exam Updated Vital Signs BP 125/73   Pulse (!) 108   Temp 98.4 F (36.9 C) (Oral)   Resp 16   Ht 5\' 9"  (1.753 m)   Wt 117.9 kg (260 lb)   SpO2 100%   BMI 38.40 kg/m   Physical Exam  Constitutional: She is oriented to person, place, and time. She appears well-developed and well-nourished. No distress.  HENT:  Head: Normocephalic and atraumatic.  Eyes: Conjunctivae and EOM are normal.  Cardiovascular: Normal rate and regular rhythm.  Pulmonary/Chest: Effort normal and breath sounds normal. No stridor. No respiratory distress.  Abdominal: She exhibits no distension.    Musculoskeletal: She exhibits no edema.  Neurological: She is alert and oriented to person, place, and  time. No cranial nerve deficit.  Skin: Skin is warm and dry.  Psychiatric: She has a normal mood and affect.  Nursing note and vitals reviewed.    ED Treatments / Results  Labs (all labs ordered are listed, but only abnormal results are displayed) Labs Reviewed  COMPREHENSIVE METABOLIC PANEL - Abnormal; Notable for the following components:      Result Value   Glucose, Bld 133 (*)    BUN 35 (*)    Creatinine, Ser 3.84 (*)    Calcium 10.5 (*)    GFR calc non Af Wyvonnia Lora  12 (*)    GFR calc Af Amer 13 (*)    All other components within normal limits  CBC - Abnormal; Notable for the following components:   WBC 15.1 (*)    RBC 3.43 (*)    Hemoglobin 10.0 (*)    HCT 32.1 (*)    Platelets 465 (*)    All other components within normal limits  LIPASE, BLOOD  URINALYSIS, ROUTINE W REFLEX MICROSCOPIC  I-STAT CG4 LACTIC ACID, ED    Radiology Dg Chest 2 View  Result Date: 10/12/2017 CLINICAL DATA:  Fever status post hospitalization EXAM: CHEST - 2 VIEW COMPARISON:  09/21/2017 FINDINGS: The heart size and mediastinal contours are within normal limits. Both lungs are clear. The visualized skeletal structures are unremarkable. IMPRESSION: No active cardiopulmonary disease. Electronically Signed   By: Kathreen Devoid   On: 10/12/2017 08:42    Procedures Procedures (including critical care time)  Medications Ordered in ED Medications - No data to display   Initial Impression / Assessment and Plan / ED Course  I have reviewed the triage vital signs and the nursing notes.  Pertinent labs & imaging results that were available during my care of the patient were reviewed by me and considered in my medical decision making (see chart for details).  9:07 AM I discussed the patient's case with our urologist on call. We discussed today's labs, x-ray, her history. We agreed that given the patient's fever, though she is afebrile here, she will start empiric antibiotics, which were prescribed via  telephone yesterday by primary care, pending urine culture result. Nodule arrange for close outpatient follow-up with urology. Absent ongoing complaints, and the patient is better/unremarkable on repeat exam, the patient is appropriate for close outpatient follow-up, with no current evidence for sepsis, bacteremia, occlusion.   This patient with notable history of recent admission for sepsis with acute renal failure, now status post placement of right-sided stent, left-sided nephrostomy presents with fever. Evaluation here generally reassuring, no lab evidence for bacteremia, sepsis, no urinalysis evidence for recurrent UTI, though the urine culture was sent. Patient will start empiric antibiotics, pending urine culture, and outpatient follow-up has been arranged. Patient remained hemodynamically unremarkable while here, with only mild tachycardia, but no hypotension, no fever. Patient discharged in stable condition.  Final Clinical Impressions(s) / ED Diagnoses   Final diagnoses:  Fever in adult    ED Discharge Orders    None       Carmin Muskrat, MD 10/12/17 [phone]

## 2017-10-21 ENCOUNTER — Encounter (HOSPITAL_BASED_OUTPATIENT_CLINIC_OR_DEPARTMENT_OTHER): Payer: Self-pay | Admitting: *Deleted

## 2017-10-21 ENCOUNTER — Other Ambulatory Visit: Payer: Self-pay

## 2017-10-21 NOTE — Progress Notes (Signed)
SPOKE W/ PT VIA PHONE FOR PRE-OP INTERVIEW.  NPO AFTER MN.  ARRIVE AT [id number]. CURRENT LAB RESULTS, CXR, AND EKG  IN CHART AND Epic. WILL TAKE SYNTHROID AM DOS W/ SIPS OF WATER AND IF NEEDED TAKE PAIN RX/ TYLENOL.

## 2017-10-29 ENCOUNTER — Encounter (HOSPITAL_BASED_OUTPATIENT_CLINIC_OR_DEPARTMENT_OTHER)
Admission: RE | Disposition: A | Discharge status: Home / Self Care | Payer: Self-pay | Source: Ambulatory Visit | Attending: Urology

## 2017-10-29 ENCOUNTER — Encounter (HOSPITAL_BASED_OUTPATIENT_CLINIC_OR_DEPARTMENT_OTHER): Payer: Self-pay

## 2017-10-29 ENCOUNTER — Ambulatory Visit (HOSPITAL_BASED_OUTPATIENT_CLINIC_OR_DEPARTMENT_OTHER)
Admission: RE | Admit: 2017-10-29 | Discharge: 2017-10-29 | Disposition: A | Discharge status: Home / Self Care | Payer: Medicare Other | Source: Ambulatory Visit | Attending: Urology | Admitting: Urology

## 2017-10-29 ENCOUNTER — Ambulatory Visit (HOSPITAL_BASED_OUTPATIENT_CLINIC_OR_DEPARTMENT_OTHER): Payer: Medicare Other | Admitting: Certified Registered Nurse Anesthetist

## 2017-10-29 ENCOUNTER — Other Ambulatory Visit: Payer: Self-pay

## 2017-10-29 DIAGNOSIS — Z8542 Personal history of malignant neoplasm of other parts of uterus: Secondary | ICD-10-CM | POA: Insufficient documentation

## 2017-10-29 DIAGNOSIS — Z466 Encounter for fitting and adjustment of urinary device: Secondary | ICD-10-CM | POA: Diagnosis not present

## 2017-10-29 DIAGNOSIS — M810 Age-related osteoporosis without current pathological fracture: Secondary | ICD-10-CM | POA: Diagnosis not present

## 2017-10-29 DIAGNOSIS — E890 Postprocedural hypothyroidism: Secondary | ICD-10-CM | POA: Diagnosis not present

## 2017-10-29 DIAGNOSIS — I10 Essential (primary) hypertension: Secondary | ICD-10-CM | POA: Diagnosis not present

## 2017-10-29 DIAGNOSIS — J45909 Unspecified asthma, uncomplicated: Secondary | ICD-10-CM | POA: Diagnosis not present

## 2017-10-29 DIAGNOSIS — N201 Calculus of ureter: Secondary | ICD-10-CM | POA: Diagnosis present

## 2017-10-29 DIAGNOSIS — Z923 Personal history of irradiation: Secondary | ICD-10-CM | POA: Diagnosis not present

## 2017-10-29 HISTORY — DX: Disorder of kidney and ureter, unspecified: N28.9

## 2017-10-29 HISTORY — PX: CYSTOSCOPY WITH RETROGRADE PYELOGRAM, URETEROSCOPY AND STENT PLACEMENT: SHX5789

## 2017-10-29 HISTORY — DX: Other intervertebral disc degeneration, lumbar region without mention of lumbar back pain or lower extremity pain: M51.369

## 2017-10-29 HISTORY — DX: Personal history of malignant neoplasm of other parts of uterus: Z85.42

## 2017-10-29 HISTORY — DX: Sciatica, left side: M54.32

## 2017-10-29 HISTORY — DX: Other intervertebral disc degeneration, lumbar region: M51.36

## 2017-10-29 HISTORY — DX: Personal history of other diseases of urinary system: Z87.448

## 2017-10-29 HISTORY — DX: Calculus of ureter: N20.1

## 2017-10-29 HISTORY — PX: HOLMIUM LASER APPLICATION: SHX5852

## 2017-10-29 HISTORY — DX: Postprocedural hypothyroidism: E89.0

## 2017-10-29 SURGERY — CYSTOURETEROSCOPY, WITH RETROGRADE PYELOGRAM AND STENT INSERTION
Anesthesia: General | Site: Bladder | Laterality: Bilateral

## 2017-10-29 MED ORDER — SODIUM CHLORIDE 0.9 % IV SOLN
INTRAVENOUS | Status: AC
  Filled 2017-10-29: qty 100

## 2017-10-29 MED ORDER — IOHEXOL 300 MG/ML  SOLN
INTRAMUSCULAR | Status: DC | PRN
  Administered 2017-10-29: 20 mL via URETHRAL

## 2017-10-29 MED ORDER — PROPOFOL 10 MG/ML IV BOLUS
INTRAVENOUS | Status: AC
  Filled 2017-10-29: qty 40

## 2017-10-29 MED ORDER — LIDOCAINE 2% (20 MG/ML) 5 ML SYRINGE
INTRAMUSCULAR | Status: AC
  Filled 2017-10-29: qty 5

## 2017-10-29 MED ORDER — HYDROMORPHONE HCL 1 MG/ML IJ SOLN
INTRAMUSCULAR | Status: AC
  Filled 2017-10-29: qty 1

## 2017-10-29 MED ORDER — HYDROMORPHONE HCL 1 MG/ML IJ SOLN
0.2500 mg | INTRAMUSCULAR | Status: DC | PRN
  Administered 2017-10-29 (×3): 0.25 mg via INTRAVENOUS
  Filled 2017-10-29: qty 0.5

## 2017-10-29 MED ORDER — LIDOCAINE 2% (20 MG/ML) 5 ML SYRINGE
INTRAMUSCULAR | Status: DC | PRN
  Administered 2017-10-29: 100 mg via INTRAVENOUS

## 2017-10-29 MED ORDER — FENTANYL CITRATE (PF) 100 MCG/2ML IJ SOLN
INTRAMUSCULAR | Status: AC
  Filled 2017-10-29: qty 2

## 2017-10-29 MED ORDER — MEPERIDINE HCL 25 MG/ML IJ SOLN
6.2500 mg | INTRAMUSCULAR | Status: DC | PRN
  Filled 2017-10-29: qty 1

## 2017-10-29 MED ORDER — MIDAZOLAM HCL 5 MG/5ML IJ SOLN
INTRAMUSCULAR | Status: DC | PRN
  Administered 2017-10-29: 2 mg via INTRAVENOUS

## 2017-10-29 MED ORDER — HYDROCODONE-ACETAMINOPHEN 5-325 MG PO TABS: 1.0000 | ORAL_TABLET | ORAL | 0 refills | Status: DC | PRN

## 2017-10-29 MED ORDER — CEFTRIAXONE SODIUM 2 G IJ SOLR
INTRAMUSCULAR | Status: AC
  Filled 2017-10-29: qty 20

## 2017-10-29 MED ORDER — FENTANYL CITRATE (PF) 100 MCG/2ML IJ SOLN
INTRAMUSCULAR | Status: DC | PRN
  Administered 2017-10-29 (×2): 25 ug via INTRAVENOUS
  Administered 2017-10-29: 50 ug via INTRAVENOUS
  Administered 2017-10-29: 25 ug via INTRAVENOUS
  Administered 2017-10-29: 50 ug via INTRAVENOUS
  Administered 2017-10-29: 25 ug via INTRAVENOUS

## 2017-10-29 MED ORDER — ONDANSETRON HCL 4 MG/2ML IJ SOLN
INTRAMUSCULAR | Status: AC
  Filled 2017-10-29: qty 2

## 2017-10-29 MED ORDER — MIDAZOLAM HCL 2 MG/2ML IJ SOLN
INTRAMUSCULAR | Status: AC
  Filled 2017-10-29: qty 2

## 2017-10-29 MED ORDER — ONDANSETRON HCL 4 MG/2ML IJ SOLN
4.0000 mg | Freq: Once | INTRAMUSCULAR | Status: DC | PRN
Start: 2017-10-29 — End: 2017-10-29
  Filled 2017-10-29: qty 2

## 2017-10-29 MED ORDER — DEXAMETHASONE SODIUM PHOSPHATE 10 MG/ML IJ SOLN
INTRAMUSCULAR | Status: DC | PRN
  Administered 2017-10-29: 10 mg via INTRAVENOUS

## 2017-10-29 MED ORDER — SODIUM CHLORIDE 0.9 % IR SOLN
Status: DC | PRN
  Administered 2017-10-29: 1000 mL via INTRAVESICAL

## 2017-10-29 MED ORDER — ONDANSETRON HCL 4 MG/2ML IJ SOLN
INTRAMUSCULAR | Status: DC | PRN
  Administered 2017-10-29: 4 mg via INTRAVENOUS

## 2017-10-29 MED ORDER — DEXAMETHASONE SODIUM PHOSPHATE 10 MG/ML IJ SOLN
INTRAMUSCULAR | Status: AC
  Filled 2017-10-29: qty 1

## 2017-10-29 MED ORDER — HYDROCODONE-ACETAMINOPHEN 5-325 MG PO TABS
1.0000 | ORAL_TABLET | ORAL | Status: DC | PRN
  Administered 2017-10-29: 1 via ORAL
  Filled 2017-10-29: qty 1

## 2017-10-29 MED ORDER — PROPOFOL 10 MG/ML IV BOLUS
INTRAVENOUS | Status: DC | PRN
  Administered 2017-10-29: 200 mg via INTRAVENOUS

## 2017-10-29 MED ORDER — SODIUM CHLORIDE 0.9 % IV SOLN
2.0000 g | INTRAVENOUS | Status: AC
  Administered 2017-10-29: 2 g via INTRAVENOUS
  Filled 2017-10-29: qty 20

## 2017-10-29 MED ORDER — HYDROCODONE-ACETAMINOPHEN 5-325 MG PO TABS
ORAL_TABLET | ORAL | Status: AC
  Filled 2017-10-29: qty 1

## 2017-10-29 MED ORDER — SODIUM CHLORIDE 0.9 % IV SOLN
INTRAVENOUS | Status: DC
  Administered 2017-10-29: 09:00:00 via INTRAVENOUS
  Administered 2017-10-29: 1000 mL via INTRAVENOUS
  Filled 2017-10-29: qty 1000

## 2017-10-29 SURGICAL SUPPLY — 35 items
BAG DRAIN URO-CYSTO SKYTR STRL (DRAIN) ×3 IMPLANT
BAG DRN UROCATH (DRAIN) ×1
CATH INTERMIT  6FR 70CM (CATHETERS) ×2 IMPLANT
CLOTH BEACON ORANGE TIMEOUT ST (SAFETY) ×3 IMPLANT
DRSG TEGADERM 2-3/8X2-3/4 SM (GAUZE/BANDAGES/DRESSINGS) ×2 IMPLANT
EVACUATOR MICROVAS BLADDER (UROLOGICAL SUPPLIES) IMPLANT
EXTRACTOR STONE 1.7FRX115CM (UROLOGICAL SUPPLIES) ×1 IMPLANT
EXTRACTOR STONE NITINOL NGAGE (UROLOGICAL SUPPLIES) ×2 IMPLANT
FIBER LASER FLEXIVA 1000 (UROLOGICAL SUPPLIES) IMPLANT
FIBER LASER FLEXIVA 200 (UROLOGICAL SUPPLIES) ×2 IMPLANT
FIBER LASER FLEXIVA 365 (UROLOGICAL SUPPLIES) IMPLANT
FIBER LASER FLEXIVA 550 (UROLOGICAL SUPPLIES) IMPLANT
FIBER LASER TRAC TIP (UROLOGICAL SUPPLIES) IMPLANT
GLOVE BIO SURGEON STRL SZ8 (GLOVE) ×3 IMPLANT
GOWN STRL REUS W/TWL LRG LVL3 (GOWN DISPOSABLE) ×3 IMPLANT
GOWN STRL REUS W/TWL XL LVL3 (GOWN DISPOSABLE) ×3 IMPLANT
GUIDEWIRE ANG ZIPWIRE 038X150 (WIRE) ×3 IMPLANT
GUIDEWIRE STR DUAL SENSOR (WIRE) IMPLANT
INFUSOR MANOMETER BAG 3000ML (MISCELLANEOUS) ×3 IMPLANT
IV NS 1000ML (IV SOLUTION) ×3
IV NS 1000ML BAXH (IV SOLUTION) ×1 IMPLANT
IV NS IRRIG 3000ML ARTHROMATIC (IV SOLUTION) ×3 IMPLANT
KIT TURNOVER CYSTO (KITS) ×3 IMPLANT
MANIFOLD NEPTUNE II (INSTRUMENTS) ×3 IMPLANT
NS IRRIG 500ML POUR BTL (IV SOLUTION) ×3 IMPLANT
PACK CYSTO (CUSTOM PROCEDURE TRAY) ×3 IMPLANT
SPONGE GAUZE 2X2 8PLY STER LF (GAUZE/BANDAGES/DRESSINGS) ×1
SPONGE GAUZE 2X2 8PLY STRL LF (GAUZE/BANDAGES/DRESSINGS) ×1 IMPLANT
STENT CONTOUR 7FRX24 (STENTS) ×2 IMPLANT
STENT URET 6FRX24 CONTOUR (STENTS) ×2 IMPLANT
STENT URET 6FRX26 CONTOUR (STENTS) IMPLANT
SYRINGE 10CC LL (SYRINGE) ×3 IMPLANT
TUBE CONNECTING 12'X1/4 (SUCTIONS) ×1
TUBE CONNECTING 12X1/4 (SUCTIONS) ×1 IMPLANT
TUBE FEEDING 8FR 16IN STR KANG (MISCELLANEOUS) ×2 IMPLANT

## 2017-10-29 NOTE — Transfer of Care (Signed)
Immediate Anesthesia Transfer of Care Note  Patient: Sheila Leach  Procedure(s) Performed: CYSTOSCOPY WITH RETROGRADE PYELOGRAM, URETEROSCOPY AND STENT PLACEMENT, REMOVAL LEFT NEPHROSTOMY TUBE (Bilateral Bladder) HOLMIUM LASER APPLICATION (Bilateral Bladder)  Patient Location: PACU  Anesthesia Type:General  Level of Consciousness: awake, alert  and oriented  Airway & Oxygen Therapy: Patient Spontanous Breathing and Patient connected to face mask oxygen  Post-op Assessment: Report given to RN and Post -op Vital signs reviewed and stable  Post vital signs: Reviewed and stable  Last Vitals:  Vitals Value Taken Time  BP 139/75 10/29/2017 12:26 PM  Temp    Pulse 111 10/29/2017 12:28 PM  Resp 17 10/29/2017 12:28 PM  SpO2 100 % 10/29/2017 12:28 PM  Vitals shown include unvalidated device data.  Last Pain:  Vitals:   10/29/17 0913  TempSrc:   PainSc: 6          Complications: No apparent anesthesia complications

## 2017-10-29 NOTE — Op Note (Addendum)
Preoperative diagnosis: bilateral ureteral stone  Postoperative diagnosis: Same  Procedure: 1 cystoscopy 2. Bilateral retrograde pyelography 3.  Intraoperative fluoroscopy, under one hour, with interpretation 4.  Bilateral ureteroscopic stone manipulation with laser lithotripsy 5.  right 6 x 24 JJ stent exchange, left 7x24 JJ ureteral stent placement 6. Left nephrostomy tube removal  Attending: Nicolette Bang  Anesthesia: General  Estimated blood loss: None  Drains:right 6x 24 JJ ureteral stent without tether. Left 7x24 JJ ureteral stent  Specimens: stone for analysis  Antibiotics: rocephin  Findings: right 70mm distal ureteral calculus. left impacted proximal ureteral stone.  No masses/lesions in the bladder. Ureteral orifices in normal anatomic location.  Indications: Patient is a 62 year old female with a history of bilateral ureteral stone and who under right stent placement and left nephrostomy tube placement for sepsis and renal failure.  After discussing treatment options, they decided proceed with bilateral ureteroscopic stone manipulation.  Procedure her in detail: The patient was brought to the operating room and a brief timeout was done to ensure correct patient, correct procedure, correct site.  General anesthesia was administered patient was placed in dorsal lithotomy position.  Her genitalia was then prepped and draped in usual sterile fashion.  A rigid 109 French cystoscope was passed in the urethra and the bladder.  Bladder was inspected free masses or lesions.  the ureteral orifices were in the normal orthotopic locations. Using a grasper the right ureteral stent was brought to the urethral meatus. Through the stent a zipwire was advanced up to the renal pelvis. The stent was then removed. a 6 french ureteral catheter was then instilled into the right ureteral orifice.  a gentle retrograde was obtained and findings noted above.  we then removed the cystoscope and  cannulated the right ureteral orifice with a semirigid ureteroscope.  We located the stone in the distal ureter and removed used a 200nm laser fiber to fragment the stone. The fragments were then removed with an Ngage basket. We then placed a 6 x 24 double-j ureteral stent over the original zip wire. We then removed the wire and good coil was noted in the the renal pelvis under fluoroscopy and the bladder under direct vision.  We then turned out attention to the left side.  6 french ureteral catheter was then instilled into the left ureteral orifice. a gentle retrograde was obtained and findings noted above.   Through the ureteral catheter a zipwire was advanced up to the renal pelvis. we then removed the cystoscope and cannulated the left ureteral orifice with a semirigid ureteroscope.  We located the stone in the proximal ureter. We then exchanged the semirigid ureteroscope for a flexible ureteroscope due to the difficulty fragmented the stone with the semirigid ureteroscope Using a 200 nm laser fiber and fragmented the stone into smaller pieces.  the pieces were then removed with a Ngage basket.   once all stone fragments were removed we then placed a 7 x 24 double-j ureteral stent over the original zip wire.  We then removed the wire and good coil was noted in the the renal pelvis under fluoroscopy and the bladder under direct vision. the stone fragments were then removed from the bladder and sent for analysis. We then removed the left nephrostomy tube and obtain a fluoroscopy image ensuring the left ureteral stent remained in good position the bladder was then drained and this concluded the procedure which was well tolerated by patient.  Complications: None  Condition: Stable, extubated, transferred to PACU  Plan:  Patient is to be discharged home as to follow-up in 2 weeks for stent removal.

## 2017-10-29 NOTE — Anesthesia Preprocedure Evaluation (Signed)
Anesthesia Evaluation  Patient identified by MRN, date of birth, ID band Patient awake    Reviewed: Allergy & Precautions, NPO status , Patient's Chart, lab work & pertinent test results  Airway Mallampati: II  TM Distance: >3 FB Neck ROM: Full    Dental   Pulmonary    Pulmonary exam normal        Cardiovascular hypertension, Pt. on medications Normal cardiovascular exam     Neuro/Psych    GI/Hepatic   Endo/Other    Renal/GU Renal InsufficiencyRenal disease     Musculoskeletal   Abdominal   Peds  Hematology   Anesthesia Other Findings   Reproductive/Obstetrics                             Anesthesia Physical Anesthesia Plan  ASA: III  Anesthesia Plan: General   Post-op Pain Management:    Induction: Intravenous  PONV Risk Score and Plan: 3 and Midazolam and Ondansetron  Airway Management Planned: LMA  Additional Equipment:   Intra-op Plan:   Post-operative Plan: Extubation in OR  Informed Consent: I have reviewed the patients History and Physical, chart, labs and discussed the procedure including the risks, benefits and alternatives for the proposed anesthesia with the patient or authorized representative who has indicated his/her understanding and acceptance.     Plan Discussed with: CRNA and Surgeon  Anesthesia Plan Comments:         Anesthesia Quick Evaluation

## 2017-10-29 NOTE — Anesthesia Postprocedure Evaluation (Signed)
Anesthesia Post Note  Patient: Lisbet Busker  Procedure(s) Performed: CYSTOSCOPY WITH RETROGRADE PYELOGRAM, URETEROSCOPY AND STENT PLACEMENT, REMOVAL LEFT NEPHROSTOMY TUBE (Bilateral Bladder) HOLMIUM LASER APPLICATION (Bilateral Bladder)     Patient location during evaluation: PACU Anesthesia Type: General Level of consciousness: awake Pain management: pain level controlled Vital Signs Assessment: post-procedure vital signs reviewed and stable Respiratory status: spontaneous breathing Cardiovascular status: stable Anesthetic complications: no    Last Vitals:  Vitals:   10/29/17 1300 10/29/17 1307  BP: 107/83   Pulse: (!) 110 (!) 111  Resp: (!) 44 (!) 40  Temp:    SpO2: 99% 98%    Last Pain:  Vitals:   10/29/17 1307  TempSrc:   PainSc: 2                  Dovey Fatzinger

## 2017-10-29 NOTE — Anesthesia Procedure Notes (Signed)
Procedure Name: LMA Insertion Date/Time: 10/29/2017 11:05 AM Performed by: Genelle Bal, CRNA Pre-anesthesia Checklist: Patient identified, Emergency Drugs available, Suction available and Patient being monitored Patient Re-evaluated:Patient Re-evaluated prior to induction Oxygen Delivery Method: Circle system utilized Preoxygenation: Pre-oxygenation with 100% oxygen Induction Type: IV induction Ventilation: Mask ventilation without difficulty LMA: LMA inserted LMA Size: 5.0 Number of attempts: 1 Airway Equipment and Method: Bite block Placement Confirmation: positive ETCO2 Tube secured with: Tape Dental Injury: Teeth and Oropharynx as per pre-operative assessment

## 2017-10-29 NOTE — H&P (Signed)
Urology Admission H&P  Chief Complaint: bilateral ureteral calculi  History of Present Illness: Sheila Leach is a 62yo with a hx of bilateral ureteral calculi. She had a stent place on the right and a nephrostomy tube placed for sepsis and renal failure.   Past Medical History:  Diagnosis Date  . Asthma    inhaler prn  . Bilateral ureteral calculi    10-21-2017 currently has retained right ureteral stent and left nephrostomy tube in place  . DDD (degenerative disc disease), lumbar   . History of acute renal failure 09/19/2017   hospital admission --- hydronephrosis ,  ureteral stones  . 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 Hx of radiation therapy 6/6/, 6/21, 6/24, 02/07/2009   post operative vaginal cuff branii therapy  . Hyperlipidemia   . Hypertension    10-21-2017  per pt was on lisinopril prior to hospital admission 02/ 2019 she become hypotensive so lisinopril was discontinued  . Hypothyroidism, postsurgical FOLLOWED BY PCP   10-21-2017 per pt dx thyroid goiter at age 73 (age 45 s/p  partial thyroidectomy), age 31 thryoid greatly increased in size felt to be pre-cancerous (s/p  total thyroidectomy)  no radiation or chemo,  no recurrence  . Left sided sciatica    10-21-2017  per pt nerve injury during hysterectomy surgery 04/ 2010  ,  left leg weaker than right but gait issue  . Osteoporosis   . Personal history of colonic adenomas 01/24/2013  . Renal insufficiency    recent aucte renal failure due to ureteral obstructive secondary to kidney stones/ hyronephrosis 09-19-2017   . Seasonal allergies    Past Surgical History:  Procedure Laterality Date  . CESAREAN  SECTION  1988  . CYSTOSCOPY W/ URETERAL STENT PLACEMENT Bilateral 09/19/2017   Procedure: CYSTOSCOPY WITH BILATERAL RETROGRADE PYELOGRAM/RIGHT URETERAL STENT PLACEMENT;  Surgeon: Cleon Gustin, MD;  Location: [facility];  Service: Urology;  Laterality: Bilateral;  . DILATION AND CURETTAGE OF UTERUS  1996   w/ resectoscopic resection fibroids and Dx Laparoscopy  . IR NEPHROSTOMY PLACEMENT LEFT  09/20/2017  . THYROIDECTOMY, PARTIAL  age 44  . TONSILLECTOMY  age 68  . TOTAL ABDOMINAL HYSTERECTOMY W/ BILATERAL SALPINGOOPHORECTOMY  11-27-2008   dr Alycia Rossetti  [facility]   w/ Bilateral Pelvic Lymph Node Dissection's   . TOTAL THYROIDECTOMY  age 71   pre -cancerous  . TRANSTHORACIC ECHOCARDIOGRAM  09/21/2017   mild concentric LVH, ef 50-55%/  trivial MR and TR    Home Medications:  Current Facility-Administered Medications  Medication Dose Route Frequency Provider Last Rate Last Dose  . 0.9 %  sodium chloride infusion   Intravenous Continuous Barnet Glasgow, MD 50 mL/hr at 10/29/17 [phone]    . cefTRIAXone (ROCEPHIN) 2 g in sodium chloride 0.9 % 100 mL IVPB  2 g Intravenous 30 min Pre-Op Shyne Resch, Candee Furbish, MD       Allergies:  Allergies  Allergen Reactions  . Betadine [Povidone Iodine] Anaphylaxis, Shortness Of Breath and Swelling    Face and lips  . Contrast Media [Iodinated Diagnostic Agents] Anaphylaxis, Shortness Of Breath and Swelling    Face and lips  . Adhesive [Tape] Rash    Family History  Problem Relation Age of Onset  . Heart disease Mother   . Diabetes Mother   . Kidney disease Mother   . Heart disease Father   . Thyroid cancer Sister   . Esophageal cancer Brother   . Diabetes Brother   . Liver disease Brother   . Colon cancer Neg Hx   . Rectal cancer Neg Hx   . Stomach cancer Neg Hx    Social History:  reports that she has never smoked. She has never used smokeless tobacco. She reports that she does not drink alcohol or use drugs.  Review of Systems  All other systems  reviewed and are negative.   Physical Exam:  Vital signs in last 24 hours: Temp:  [98.4 F (36.9 C)] 98.4 F (36.9 C) (03/29 0842) Pulse Rate:  [128] 128 (03/29 0842) Resp:  [18] 18 (03/29 0842) BP: (146)/(84) 146/84 (03/29 0842) SpO2:  [100 %] 100 % (03/29 0842) Weight:  [118.1 kg (260 lb 4.8 oz)] 118.1 kg (260 lb 4.8 oz) (03/29 4128) Physical Exam  Constitutional: She is oriented to person, place, and time. She appears well-developed and well-nourished.  HENT:  Head: Normocephalic and atraumatic.  Eyes: Pupils are equal, round, and reactive to light. EOM are normal.  Neck: Normal range of motion. No thyromegaly present.  Cardiovascular: Normal rate and regular rhythm.  Respiratory: Effort normal. No respiratory distress.  GI: Soft. She exhibits no distension.  Musculoskeletal: Normal range of motion. She exhibits no edema.  Neurological: She is alert and oriented to person, place, and time.  Skin: Skin is warm and dry.  Psychiatric: She has a normal mood and affect. Her behavior is normal. Judgment and thought content normal.    Laboratory Data:  No results found for this or any previous visit (from the past 24 hour(s)). No results found for this or any previous visit (from the past 240 hour(s)). Creatinine: No results for input(s): CREATININE in the last 168 hours. Baseline Creatinine: unknown  Impression/Assessment:  61yo with bilateral renal calculi  Plan:  The risks/benefits/alternatives to bilateral ureteroscopy was explained to the patient and she understands and wishes to proceed with surgery  Nicolette Bang 10/29/2017, 10:49 AM

## 2017-10-29 NOTE — Discharge Instructions (Signed)
Ureteral Stent Implantation, Care After °Refer to this sheet in the next few weeks. These instructions provide you with information about caring for yourself after your procedure. Your health care provider may also give you more specific instructions. Your treatment has been planned according to current medical practices, but problems sometimes occur. Call your health care provider if you have any problems or questions after your procedure. °What can I expect after the procedure? °After the procedure, it is common to have: °· Nausea. °· Mild pain when you urinate. You may feel this pain in your lower back or lower abdomen. Pain should stop within a few minutes after you urinate. This may last for up to 1 week. °· A small amount of blood in your urine for several days. ° °Follow these instructions at home: ° °Medicines °· Take over-the-counter and prescription medicines only as told by your health care provider. °· If you were prescribed an antibiotic medicine, take it as told by your health care provider. Do not stop taking the antibiotic even if you start to feel better. °· Do not drive for 24 hours if you received a sedative. °· Do not drive or operate heavy machinery while taking prescription pain medicines. °Activity °· Return to your normal activities as told by your health care provider. Ask your health care provider what activities are safe for you. °· Do not lift anything that is heavier than 10 lb (4.5 kg). Follow this limit for 1 week after your procedure, or for as long as told by your health care provider. °General instructions °· Watch for any blood in your urine. Call your health care provider if the amount of blood in your urine increases. °· If you have a catheter: °? Follow instructions from your health care provider about taking care of your catheter and collection bag. °? Do not take baths, swim, or use a hot tub until your health care provider approves. °· Drink enough fluid to keep your urine  clear or pale yellow. °· Keep all follow-up visits as told by your health care provider. This is important. °Contact a health care provider if: °· You have pain that gets worse or does not get better with medicine, especially pain when you urinate. °· You have difficulty urinating. °· You feel nauseous or you vomit repeatedly during a period of more than 2 days after the procedure. °Get help right away if: °· Your urine is dark red or has blood clots in it. °· You are leaking urine (have incontinence). °· The end of the stent comes out of your urethra. °· You cannot urinate. °· You have sudden, sharp, or severe pain in your abdomen or lower back. °· You have a fever. °This information is not intended to replace advice given to you by your health care provider. Make sure you discuss any questions you have with your health care provider. °Document Released: 03/22/2013 Document Revised: 12/26/2015 Document Reviewed: 02/01/2015 °Elsevier Interactive Patient Education © 2018 [employer] ° ° °Post Anesthesia Home Care Instructions ° °Activity: °Get plenty of rest for the remainder of the day. A responsible individual must stay with you for 24 hours following the procedure.  °For the next 24 hours, DO NOT: °-Drive a car °-Operate machinery °-Drink alcoholic beverages °-Take any medication unless instructed by your physician °-Make any legal decisions or sign important papers. ° °Meals: °Start with liquid foods such as gelatin or soup. Progress to regular foods as tolerated. Avoid greasy, spicy, heavy foods. If nausea   and/or vomiting occur, drink only clear liquids until the nausea and/or vomiting subsides. Call your physician if vomiting continues. ° °Special Instructions/Symptoms: °Your throat may feel dry or sore from the anesthesia or the breathing tube placed in your throat during surgery. If this causes discomfort, gargle with warm salt water. The discomfort should disappear within 24 hours. ° °If you had a  scopolamine patch placed behind your ear for the management of post- operative nausea and/or vomiting: ° °1. The medication in the patch is effective for 72 hours, after which it should be removed.  Wrap patch in a tissue and discard in the trash. Wash hands thoroughly with soap and water. °2. You may remove the patch earlier than 72 hours if you experience unpleasant side effects which may include dry mouth, dizziness or visual disturbances. °3. Avoid touching the patch. Wash your hands with soap and water after contact with the patch. °  ° °

## 2017-11-01 ENCOUNTER — Encounter (HOSPITAL_BASED_OUTPATIENT_CLINIC_OR_DEPARTMENT_OTHER): Payer: Self-pay | Admitting: Urology

## 2018-01-21 ENCOUNTER — Other Ambulatory Visit: Payer: Self-pay | Admitting: Internal Medicine

## 2018-01-21 DIAGNOSIS — Z1231 Encounter for screening mammogram for malignant neoplasm of breast: Secondary | ICD-10-CM

## 2018-02-10 ENCOUNTER — Ambulatory Visit
Admission: RE | Admit: 2018-02-10 | Discharge: 2018-02-10 | Disposition: A | Discharge status: Home / Self Care | Payer: Medicare Other | Source: Ambulatory Visit | Attending: Internal Medicine | Admitting: Internal Medicine

## 2018-02-10 DIAGNOSIS — Z1231 Encounter for screening mammogram for malignant neoplasm of breast: Secondary | ICD-10-CM

## 2019-02-09 ENCOUNTER — Other Ambulatory Visit: Payer: Self-pay | Admitting: Surgery

## 2019-02-09 ENCOUNTER — Other Ambulatory Visit (HOSPITAL_COMMUNITY): Payer: Self-pay | Admitting: Surgery

## 2019-02-09 DIAGNOSIS — E210 Primary hyperparathyroidism: Secondary | ICD-10-CM

## 2019-02-09 DIAGNOSIS — E559 Vitamin D deficiency, unspecified: Secondary | ICD-10-CM

## 2019-02-24 ENCOUNTER — Ambulatory Visit (HOSPITAL_COMMUNITY)
Admission: RE | Admit: 2019-02-24 | Discharge: 2019-02-24 | Disposition: A | Discharge status: Home / Self Care | Payer: Medicare Other | Source: Ambulatory Visit | Attending: Surgery | Admitting: Surgery

## 2019-02-24 ENCOUNTER — Encounter (HOSPITAL_COMMUNITY): Payer: Self-pay

## 2019-02-24 ENCOUNTER — Other Ambulatory Visit: Payer: Self-pay

## 2019-02-24 DIAGNOSIS — E210 Primary hyperparathyroidism: Secondary | ICD-10-CM | POA: Insufficient documentation

## 2019-02-24 DIAGNOSIS — E559 Vitamin D deficiency, unspecified: Secondary | ICD-10-CM | POA: Insufficient documentation

## 2019-02-24 MED ORDER — TECHNETIUM TC 99M SESTAMIBI GENERIC - CARDIOLITE
25.0000 | Freq: Once | INTRAVENOUS | Status: AC | PRN
  Administered 2019-02-24: 11:00:00 25 via INTRAVENOUS

## 2019-03-06 ENCOUNTER — Other Ambulatory Visit: Payer: Self-pay | Admitting: Surgery

## 2019-03-06 DIAGNOSIS — E210 Primary hyperparathyroidism: Secondary | ICD-10-CM

## 2019-03-06 DIAGNOSIS — E559 Vitamin D deficiency, unspecified: Secondary | ICD-10-CM

## 2019-03-16 ENCOUNTER — Other Ambulatory Visit: Payer: Self-pay | Admitting: Surgery

## 2019-03-16 DIAGNOSIS — E210 Primary hyperparathyroidism: Secondary | ICD-10-CM

## 2019-03-23 ENCOUNTER — Other Ambulatory Visit: Payer: Self-pay | Admitting: Internal Medicine

## 2019-03-23 DIAGNOSIS — Z1231 Encounter for screening mammogram for malignant neoplasm of breast: Secondary | ICD-10-CM

## 2019-04-15 ENCOUNTER — Ambulatory Visit
Admission: RE | Admit: 2019-04-15 | Discharge: 2019-04-15 | Disposition: A | Discharge status: Home / Self Care | Payer: Medicare Other | Source: Ambulatory Visit | Attending: Surgery | Admitting: Surgery

## 2019-04-15 ENCOUNTER — Other Ambulatory Visit: Payer: Self-pay

## 2019-04-15 ENCOUNTER — Other Ambulatory Visit: Payer: Self-pay | Admitting: Surgery

## 2019-04-15 DIAGNOSIS — E210 Primary hyperparathyroidism: Secondary | ICD-10-CM

## 2019-05-03 ENCOUNTER — Ambulatory Visit: Payer: Self-pay | Admitting: Surgery

## 2019-05-05 NOTE — Progress Notes (Signed)
PCP - Shon Baton Cardiologist -   Chest x-ray -  EKG -  Stress Test -  ECHO - 09-2017 epic Cardiac Cath -   Sleep Study -  CPAP -   Fasting Blood Sugar -  Checks Blood Sugar _____ times a day  Blood Thinner Instructions: Aspirin Instructions: Last Dose:  Anesthesia review: creatine 2.84 , bun 41, improved  Patient denies shortness of breath, fever, cough and chest pain at PAT appointment   none   Patient verbalized understanding of instructions that were given to them at the PAT appointment. Patient was also instructed that they will need to review over the PAT instructions again at home before surgery.

## 2019-05-05 NOTE — Patient Instructions (Addendum)
DUE TO COVID-19 ONLY ONE VISITOR IS ALLOWED TO COME WITH YOU AND STAY IN THE WAITING ROOM ONLY DURING PRE OP AND PROCEDURE DAY OF SURGERY. THE 1 VISITOR MAY VISIT WITH YOU AFTER SURGERY IN YOUR PRIVATE ROOM DURING VISITING HOURS ONLY!  YOU NEED TO HAVE A COVID 19 TEST ON_______ @_______ , THIS TEST MUST BE DONE BEFORE SURGERY, COME  Sheila Leach , [id number].  ([place]) ONCE YOUR COVID TEST IS COMPLETED, PLEASE BEGIN THE QUARANTINE INSTRUCTIONS AS OUTLINED IN YOUR HANDOUT.                Sheila Leach  05/05/2019   Your procedure is scheduled on: 05-12-19   Report to [facility] Main  Entrance   Report to admitting at        0800 AM     Call this number if you have problems the morning of surgery [phone]    Remember: Do not eat food or drink liquids :After Midnight.  BRUSH YOUR TEETH MORNING OF SURGERY AND RINSE YOUR MOUTH OUT, NO CHEWING GUM CANDY OR MINTS.     Take these medicines the morning of surgery with A SIP OF WATER: levothyroxine, fenofibrate, zyrtec, inhaler and bring with you, tylenol if needed                                 You may not have any metal on your body including hair pins and              piercings  Do not wear jewelry, make-up, lotions, powders or perfumes, deodorant             Do not wear nail polish on your fingernails.  Do not shave  48 hours prior to surgery.            Do not bring valuables to the hospital. [place].  Contacts, dentures or bridgework may not be worn into surgery.      Patients discharged the day of surgery will not be allowed to drive home. IF YOU ARE HAVING SURGERY AND GOING HOME THE SAME DAY, YOU MUST HAVE AN ADULT TO DRIVE YOU HOME AND BE WITH YOU FOR 24 HOURS. YOU MAY GO HOME BY TAXI OR UBER OR ORTHERWISE, BUT AN ADULT MUST ACCOMPANY YOU HOME AND STAY WITH YOU FOR 24 HOURS.  Name and phone number of your driver:  Special Instructions:  N/A              Please read over the following fact sheets you were given: _____________________________________________________________________             [facility] - Preparing for Surgery Before surgery, you can play an important role.  Because skin is not sterile, your skin needs to be as free of germs as possible.  You can reduce the number of germs on your skin by washing with CHG (chlorahexidine gluconate) soap before surgery.  CHG is an antiseptic cleaner which kills germs and bonds with the skin to continue killing germs even after washing. Please DO NOT use if you have an allergy to CHG or antibacterial soaps.  If your skin becomes reddened/irritated stop using the CHG and inform your nurse when you arrive at Short Stay. Do not shave (including legs and underarms) for  at least 48 hours prior to the first CHG shower.  You may shave your face/neck. Please follow these instructions carefully:  1.  Shower with CHG Soap the night before surgery and the  morning of Surgery.  2.  If you choose to wash your hair, wash your hair first as usual with your  normal  shampoo.  3.  After you shampoo, rinse your hair and body thoroughly to remove the  shampoo.                           4.  Use CHG as you would any other liquid soap.  You can apply chg directly  to the skin and wash                       Gently with a scrungie or clean washcloth.  5.  Apply the CHG Soap to your body ONLY FROM THE NECK DOWN.   Do not use on face/ open                           Wound or open sores. Avoid contact with eyes, ears mouth and genitals (private parts).                       Wash face,  Genitals (private parts) with your normal soap.             6.  Wash thoroughly, paying special attention to the area where your surgery  will be performed.  7.  Thoroughly rinse your body with warm water from the neck down.  8.  DO NOT shower/wash with your normal soap after using and rinsing off  the CHG Soap.                 9.  Pat yourself dry with a clean towel.            10.  Wear clean pajamas.            11.  Place clean sheets on your bed the night of your first shower and do not  sleep with pets. Day of Surgery : Do not apply any lotions/deodorants the morning of surgery.  Please wear clean clothes to the hospital/surgery center.  FAILURE TO FOLLOW THESE INSTRUCTIONS MAY RESULT IN THE CANCELLATION OF YOUR SURGERY PATIENT SIGNATURE_________________________________  NURSE SIGNATURE__________________________________  ________________________________________________________________________

## 2019-05-07 ENCOUNTER — Encounter (HOSPITAL_COMMUNITY): Payer: Self-pay | Admitting: Surgery

## 2019-05-07 DIAGNOSIS — E210 Primary hyperparathyroidism: Secondary | ICD-10-CM | POA: Diagnosis present

## 2019-05-07 NOTE — H&P (Signed)
General Surgery [facility]  Sheila Leach DOB: 1955-11-13 Divorced / Language: Cleophus Molt / Race: White Female   History of Present Illness   The patient is a 63 year old female who presents with primary hyperparathyroidism.  CHIEF COMPLAINT: hypercalcemia, rule out primary hyperparathyroidism  Patient is referred by Dr. Shon Baton for surgical evaluation and management of hypercalcemia and suspected primary hyperparathyroidism. Patient's urologist is Dr. Nicolette Bang. Patient has a long-standing history of hypercalcemia dating back many years. She had undergone a nuclear medicine parathyroid scan in the fall of 2016 which localized a potential left inferior parathyroid adenoma. Apparently no further intervention was performed. Patient also has a history of nephrolithiasis. She has chronic kidney disease. She has osteopenia. She complains of chronic fatigue. Patient has a significant endocrine history having undergone thyroid surgery at the [place] many years ago. She also underwent treatment with radioactive iodine for hyperthyroidism. I do not have details related to those procedures. Patient now presents on referral from her primary care physician for evaluation of suspected primary hyperparathyroidism. Recent calcium level was markedly elevated at 13.2. Intact PTH level was elevated at 152. 25-hydroxy vitamin D level was low at 17.3. The patient has had no recent imaging studies. She presents today to review these results and to discuss further evaluation and management.   Allergies Iodine (Antiseptic) *ANTISEPTICS & DISINFECTANTS*  Allergies Reconciled   Medication History Levothyroxine Sodium (300MCG Tablet, Oral) Active. Multi Vitamin (Oral) Active. Medications Reconciled  Social History  Tobacco use  Never smoker.  Family History  Cerebrovascular Accident  Father. Heart Disease  Father.  Pregnancy  / Birth History Age of menopause  42-50 Maternal age  61-35  Other Problems Asthma  Back Pain   Review of Systems  HEENT Present- Seasonal Allergies. Not Present- Earache, Hearing Loss, Hoarseness, Nose Bleed, Oral Ulcers, Ringing in the Ears, Sinus Pain, Sore Throat, Visual Disturbances, Wears glasses/contact lenses and Yellow Eyes. Female Genitourinary Not Present- Frequency, Nocturia, Painful Urination, Pelvic Pain and Urgency.  Vitals  Weight: 283 lb Height: 65in Body Surface Area: 2.29 m Body Mass Index: 47.09 kg/m  Temp.: 98.74F  Pulse: 60 (Regular)  BP: 132/84(Sitting, Left Arm, Standard)   Physical Exam   See vital signs recorded above  GENERAL APPEARANCE Development: normal Nutritional status: normal Gross deformities: none  SKIN Rash, lesions, ulcers: none Induration, erythema: none Nodules: none palpable  EYES Conjunctiva and lids: normal Pupils: equal and reactive Iris: normal bilaterally  EARS, NOSE, MOUTH, THROAT External ears: no lesion or deformity External nose: no lesion or deformity Hearing: grossly normal  NECK Symmetric: yes Trachea: midline Thyroid: no palpable nodules in the thyroid bed Well-healed traditional Coker incision low anterior neck with mild keloid formation.  CHEST Respiratory effort: normal Retraction or accessory muscle use: no Breath sounds: normal bilaterally Rales, rhonchi, wheeze: none  CARDIOVASCULAR Auscultation: regular rhythm, normal rate Murmurs: none Pulses: carotid and radial pulse 2+ palpable Lower extremity edema: none Lower extremity varicosities: none  MUSCULOSKELETAL Station and gait: normal Digits and nails: no clubbing or cyanosis Muscle strength: grossly normal all extremities Range of motion: grossly normal all extremities Deformity: none  LYMPHATIC Cervical: none palpable Supraclavicular: none palpable  PSYCHIATRIC Oriented to person, place, and time: yes Mood and  affect: normal for situation Judgment and insight: appropriate for situation    Assessment & Plan  PRIMARY HYPERPARATHYROIDISM (E21.0)   Pt Education - Pamphlet Given - The Parathyroid Surgery Book: discussed with patient  and provided information.  VITAMIN D DEFICIENCY (E55.9)  Patient presents today on referral from her primary care physician for evaluation of hypercalcemia and suspected primary hyperparathyroidism. She is provided with written literature on parathyroid surgery to review at home.  Patient has biochemical evidence of primary hyperparathyroidism with a markedly elevated serum calcium level and elevated intact PTH level. However, she is vitamin D deficient. Patient has not had any recent imaging studies. Therefore I recommended proceeding with an ultrasound examination of the neck in hopes of identifying an enlarged parathyroid gland and also to define the current status of her thyroid gland given her previous history of thyroid surgery and radioactive iodine treatment. We will also obtain a nuclear medicine parathyroid scan to see if there is a hyperfunctioning parathyroid gland evident on this scan. If these studies localize evidence of a parathyroid adenoma, she may be a good candidate for minimally invasive outpatient surgery. If these studies are unrevealing, then we will proceed with a 4D CT scan of the neck in hopes of identifying a parathyroid adenoma.  Patient will undergo the above studies, we will contact her when the results are available.    ADDENDUM  Telephone call to patient with results of the MRI scan from April 16, 2019. This localizes a parathyroid adenoma measuring 1.6 cm to the left superior position.  We will plan minimally invasive outpatient surgery for parathyroidectomy. I discussed this at length with the patient. I answered all of her questions. She wishes to proceed in the near future.  The risks and benefits of the procedure have  been discussed at length with the patient. The patient understands the proposed procedure, potential alternative treatments, and the course of recovery to be expected. All of the patient's questions have been answered at this time. The patient wishes to proceed with surgery.   Armandina Gemma, [place] [facility] Office: [phone]

## 2019-05-08 ENCOUNTER — Ambulatory Visit
Admission: RE | Admit: 2019-05-08 | Discharge: 2019-05-08 | Disposition: A | Discharge status: Home / Self Care | Payer: Medicare Other | Source: Ambulatory Visit | Attending: Internal Medicine | Admitting: Internal Medicine

## 2019-05-08 ENCOUNTER — Encounter (HOSPITAL_COMMUNITY): Payer: Self-pay

## 2019-05-08 ENCOUNTER — Encounter (HOSPITAL_COMMUNITY)
Admission: RE | Admit: 2019-05-08 | Discharge: 2019-05-08 | Disposition: A | Discharge status: Home / Self Care | Payer: Medicare Other | Source: Ambulatory Visit | Attending: Surgery | Admitting: Surgery

## 2019-05-08 ENCOUNTER — Other Ambulatory Visit: Payer: Self-pay

## 2019-05-08 DIAGNOSIS — E210 Primary hyperparathyroidism: Secondary | ICD-10-CM | POA: Diagnosis not present

## 2019-05-08 DIAGNOSIS — R9431 Abnormal electrocardiogram [ECG] [EKG]: Secondary | ICD-10-CM | POA: Insufficient documentation

## 2019-05-08 DIAGNOSIS — R000 Tachycardia, unspecified: Secondary | ICD-10-CM | POA: Insufficient documentation

## 2019-05-08 DIAGNOSIS — Z1231 Encounter for screening mammogram for malignant neoplasm of breast: Secondary | ICD-10-CM

## 2019-05-08 DIAGNOSIS — Z01818 Encounter for other preprocedural examination: Secondary | ICD-10-CM | POA: Insufficient documentation

## 2019-05-08 HISTORY — DX: Personal history of urinary calculi: Z87.442

## 2019-05-08 HISTORY — DX: Other complications of anesthesia, initial encounter: T88.59XA

## 2019-05-08 LAB — CBC
HCT: 44.9 % (ref 36.0–46.0)
Hemoglobin: 14.2 g/dL (ref 12.0–15.0)
MCH: 29.9 pg (ref 26.0–34.0)
MCHC: 31.6 g/dL (ref 30.0–36.0)
MCV: 94.5 fL (ref 80.0–100.0)
Platelets: 318 10*3/uL (ref 150–400)
RBC: 4.75 MIL/uL (ref 3.87–5.11)
RDW: 12.7 % (ref 11.5–15.5)
WBC: 9.4 10*3/uL (ref 4.0–10.5)
nRBC: 0 % (ref 0.0–0.2)

## 2019-05-08 LAB — BASIC METABOLIC PANEL
Anion gap: 9 (ref 5–15)
BUN: 41 mg/dL — ABNORMAL HIGH (ref 8–23)
CO2: 25 mmol/L (ref 22–32)
Calcium: 11.9 mg/dL — ABNORMAL HIGH (ref 8.9–10.3)
Chloride: 108 mmol/L (ref 98–111)
Creatinine, Ser: 2.84 mg/dL — ABNORMAL HIGH (ref 0.44–1.00)
GFR calc Af Amer: 20 mL/min — ABNORMAL LOW (ref >60)
GFR calc non Af Amer: 17 mL/min — ABNORMAL LOW (ref >60)
Glucose, Bld: 132 mg/dL — ABNORMAL HIGH (ref 70–99)
Potassium: 4.1 mmol/L (ref 3.5–5.1)
Sodium: 142 mmol/L (ref 135–145)

## 2019-05-09 ENCOUNTER — Other Ambulatory Visit (HOSPITAL_COMMUNITY)
Admission: RE | Admit: 2019-05-09 | Discharge: 2019-05-09 | Disposition: A | Discharge status: Home / Self Care | Payer: Medicare Other | Source: Ambulatory Visit | Attending: Surgery | Admitting: Surgery

## 2019-05-09 DIAGNOSIS — Z01812 Encounter for preprocedural laboratory examination: Secondary | ICD-10-CM | POA: Insufficient documentation

## 2019-05-09 DIAGNOSIS — Z20828 Contact with and (suspected) exposure to other viral communicable diseases: Secondary | ICD-10-CM | POA: Insufficient documentation

## 2019-05-10 LAB — NOVEL CORONAVIRUS, NAA (HOSP ORDER, SEND-OUT TO REF LAB; TAT 18-24 HRS): SARS-CoV-2, NAA: NOT DETECTED

## 2019-05-11 MED ORDER — DEXTROSE 5 % IV SOLN
3.0000 g | INTRAVENOUS | Status: AC
  Administered 2019-05-12: 11:00:00 3 g via INTRAVENOUS
  Filled 2019-05-11: qty 3

## 2019-05-11 NOTE — Progress Notes (Signed)
LVM with patient with time change pt. To arrive to admitting at 0830 am

## 2019-05-12 ENCOUNTER — Telehealth (HOSPITAL_COMMUNITY): Payer: Self-pay | Admitting: *Deleted

## 2019-05-12 ENCOUNTER — Other Ambulatory Visit: Payer: Self-pay

## 2019-05-12 ENCOUNTER — Ambulatory Visit (HOSPITAL_COMMUNITY): Payer: Medicare Other | Admitting: Physician Assistant

## 2019-05-12 ENCOUNTER — Encounter (HOSPITAL_COMMUNITY): Payer: Self-pay | Admitting: *Deleted

## 2019-05-12 ENCOUNTER — Ambulatory Visit (HOSPITAL_COMMUNITY): Payer: Medicare Other | Admitting: Anesthesiology

## 2019-05-12 ENCOUNTER — Ambulatory Visit (HOSPITAL_COMMUNITY)
Admission: RE | Admit: 2019-05-12 | Discharge: 2019-05-12 | Disposition: A | Discharge status: Home / Self Care | Payer: Medicare Other | Attending: Surgery | Admitting: Surgery

## 2019-05-12 ENCOUNTER — Encounter (HOSPITAL_COMMUNITY)
Admission: RE | Disposition: A | Discharge status: Home / Self Care | Payer: Self-pay | Source: Home / Self Care | Attending: Surgery

## 2019-05-12 DIAGNOSIS — N189 Chronic kidney disease, unspecified: Secondary | ICD-10-CM | POA: Diagnosis not present

## 2019-05-12 DIAGNOSIS — E039 Hypothyroidism, unspecified: Secondary | ICD-10-CM | POA: Diagnosis not present

## 2019-05-12 DIAGNOSIS — D351 Benign neoplasm of parathyroid gland: Secondary | ICD-10-CM | POA: Diagnosis not present

## 2019-05-12 DIAGNOSIS — E210 Primary hyperparathyroidism: Secondary | ICD-10-CM | POA: Diagnosis not present

## 2019-05-12 DIAGNOSIS — I129 Hypertensive chronic kidney disease with stage 1 through stage 4 chronic kidney disease, or unspecified chronic kidney disease: Secondary | ICD-10-CM | POA: Insufficient documentation

## 2019-05-12 DIAGNOSIS — Z6841 Body Mass Index (BMI) 40.0 and over, adult: Secondary | ICD-10-CM | POA: Insufficient documentation

## 2019-05-12 DIAGNOSIS — Z79890 Hormone replacement therapy (postmenopausal): Secondary | ICD-10-CM | POA: Insufficient documentation

## 2019-05-12 DIAGNOSIS — E559 Vitamin D deficiency, unspecified: Secondary | ICD-10-CM | POA: Insufficient documentation

## 2019-05-12 HISTORY — PX: PARATHYROIDECTOMY: SHX19

## 2019-05-12 SURGERY — PARATHYROIDECTOMY
Anesthesia: General | Site: Neck | Laterality: Left

## 2019-05-12 MED ORDER — DEXAMETHASONE SODIUM PHOSPHATE 10 MG/ML IJ SOLN
INTRAMUSCULAR | Status: DC | PRN
  Administered 2019-05-12: 5 mg via INTRAVENOUS

## 2019-05-12 MED ORDER — TRAMADOL HCL 50 MG PO TABS: 50.0000 mg | ORAL_TABLET | Freq: Four times a day (QID) | ORAL | 0 refills | Status: DC | PRN

## 2019-05-12 MED ORDER — CHLORHEXIDINE GLUCONATE CLOTH 2 % EX PADS: 6.0000 | MEDICATED_PAD | Freq: Once | CUTANEOUS | Status: DC

## 2019-05-12 MED ORDER — LIDOCAINE 2% (20 MG/ML) 5 ML SYRINGE
INTRAMUSCULAR | Status: DC | PRN
  Administered 2019-05-12: 60 mg via INTRAVENOUS

## 2019-05-12 MED ORDER — PROPOFOL 10 MG/ML IV BOLUS
INTRAVENOUS | Status: DC | PRN
  Administered 2019-05-12: 140 mg via INTRAVENOUS

## 2019-05-12 MED ORDER — DEXAMETHASONE SODIUM PHOSPHATE 10 MG/ML IJ SOLN
INTRAMUSCULAR | Status: AC
  Filled 2019-05-12: qty 1

## 2019-05-12 MED ORDER — LIDOCAINE 2% (20 MG/ML) 5 ML SYRINGE
INTRAMUSCULAR | Status: AC
  Filled 2019-05-12: qty 5

## 2019-05-12 MED ORDER — PROPOFOL 10 MG/ML IV BOLUS
INTRAVENOUS | Status: AC
  Filled 2019-05-12: qty 20

## 2019-05-12 MED ORDER — ROCURONIUM BROMIDE 100 MG/10ML IV SOLN
INTRAVENOUS | Status: DC | PRN
  Administered 2019-05-12: 40 mg via INTRAVENOUS

## 2019-05-12 MED ORDER — HYDROMORPHONE HCL 1 MG/ML IJ SOLN
INTRAMUSCULAR | Status: AC
  Filled 2019-05-12: qty 1

## 2019-05-12 MED ORDER — PHENYLEPHRINE 40 MCG/ML (10ML) SYRINGE FOR IV PUSH (FOR BLOOD PRESSURE SUPPORT)
PREFILLED_SYRINGE | INTRAVENOUS | Status: DC | PRN
  Administered 2019-05-12: 80 ug via INTRAVENOUS
  Administered 2019-05-12: 120 ug via INTRAVENOUS

## 2019-05-12 MED ORDER — ONDANSETRON HCL 4 MG/2ML IJ SOLN
INTRAMUSCULAR | Status: DC | PRN
  Administered 2019-05-12: 4 mg via INTRAVENOUS

## 2019-05-12 MED ORDER — ONDANSETRON HCL 4 MG/2ML IJ SOLN
INTRAMUSCULAR | Status: AC
  Filled 2019-05-12: qty 2

## 2019-05-12 MED ORDER — SUGAMMADEX SODIUM 500 MG/5ML IV SOLN
INTRAVENOUS | Status: AC
  Filled 2019-05-12: qty 5

## 2019-05-12 MED ORDER — LACTATED RINGERS IV SOLN
INTRAVENOUS | Status: DC
  Administered 2019-05-12: 09:00:00 via INTRAVENOUS

## 2019-05-12 MED ORDER — 0.9 % SODIUM CHLORIDE (POUR BTL) OPTIME
TOPICAL | Status: DC | PRN
  Administered 2019-05-12: 1000 mL

## 2019-05-12 MED ORDER — PROMETHAZINE HCL 25 MG/ML IJ SOLN
3.5000 mg | Freq: Once | INTRAMUSCULAR | Status: AC
  Administered 2019-05-12: 3.5 mg via INTRAVENOUS

## 2019-05-12 MED ORDER — FENTANYL CITRATE (PF) 250 MCG/5ML IJ SOLN
INTRAMUSCULAR | Status: AC
  Filled 2019-05-12: qty 5

## 2019-05-12 MED ORDER — PROMETHAZINE HCL 25 MG/ML IJ SOLN
INTRAMUSCULAR | Status: AC
  Filled 2019-05-12: qty 1

## 2019-05-12 MED ORDER — SUCCINYLCHOLINE CHLORIDE 200 MG/10ML IV SOSY
PREFILLED_SYRINGE | INTRAVENOUS | Status: AC
  Filled 2019-05-12: qty 10

## 2019-05-12 MED ORDER — ROCURONIUM BROMIDE 10 MG/ML (PF) SYRINGE
PREFILLED_SYRINGE | INTRAVENOUS | Status: AC
  Filled 2019-05-12: qty 10

## 2019-05-12 MED ORDER — ACETAMINOPHEN 500 MG PO TABS
1000.0000 mg | ORAL_TABLET | Freq: Once | ORAL | Status: AC
  Administered 2019-05-12: 10:00:00 1000 mg via ORAL
  Filled 2019-05-12: qty 2

## 2019-05-12 MED ORDER — BUPIVACAINE HCL (PF) 0.25 % IJ SOLN
INTRAMUSCULAR | Status: AC
  Filled 2019-05-12: qty 30

## 2019-05-12 MED ORDER — FENTANYL CITRATE (PF) 100 MCG/2ML IJ SOLN
INTRAMUSCULAR | Status: DC | PRN
  Administered 2019-05-12: 100 ug via INTRAVENOUS
  Administered 2019-05-12 (×3): 50 ug via INTRAVENOUS

## 2019-05-12 MED ORDER — HYDROMORPHONE HCL 1 MG/ML IJ SOLN: 0.2500 mg | INTRAMUSCULAR | Status: DC | PRN

## 2019-05-12 MED ORDER — HYDROMORPHONE HCL 1 MG/ML IJ SOLN
0.2500 mg | INTRAMUSCULAR | Status: DC | PRN
  Administered 2019-05-12 (×3): 0.5 mg via INTRAVENOUS

## 2019-05-12 MED ORDER — SUCCINYLCHOLINE CHLORIDE 200 MG/10ML IV SOSY
PREFILLED_SYRINGE | INTRAVENOUS | Status: DC | PRN
  Administered 2019-05-12: 100 mg via INTRAVENOUS

## 2019-05-12 MED ORDER — BUPIVACAINE HCL 0.25 % IJ SOLN
INTRAMUSCULAR | Status: DC | PRN
  Administered 2019-05-12: 17 mL

## 2019-05-12 MED ORDER — SUGAMMADEX SODIUM 200 MG/2ML IV SOLN
INTRAVENOUS | Status: DC | PRN
  Administered 2019-05-12: 300 mg via INTRAVENOUS

## 2019-05-12 SURGICAL SUPPLY — 33 items
ADH SKN CLS APL DERMABOND .7 (GAUZE/BANDAGES/DRESSINGS) ×1
APL PRP STRL LF DISP 70% ISPRP (MISCELLANEOUS) ×1
ATTRACTOMAT 16X20 MAGNETIC DRP (DRAPES) ×3 IMPLANT
BLADE SURG 15 STRL LF DISP TIS (BLADE) ×1 IMPLANT
BLADE SURG 15 STRL SS (BLADE) ×3
CHLORAPREP W/TINT 26 (MISCELLANEOUS) ×3 IMPLANT
CLIP VESOCCLUDE MED 6/CT (CLIP) ×6 IMPLANT
CLIP VESOCCLUDE SM WIDE 6/CT (CLIP) ×6 IMPLANT
COVER SURGICAL LIGHT HANDLE (MISCELLANEOUS) ×3 IMPLANT
COVER WAND RF STERILE (DRAPES) ×3 IMPLANT
DERMABOND ADVANCED (GAUZE/BANDAGES/DRESSINGS) ×2
DERMABOND ADVANCED .7 DNX12 (GAUZE/BANDAGES/DRESSINGS) IMPLANT
DRAPE LAPAROTOMY T 98X78 PEDS (DRAPES) ×3 IMPLANT
ELECT PENCIL ROCKER SW 15FT (MISCELLANEOUS) ×3 IMPLANT
ELECT REM PT RETURN 15FT ADLT (MISCELLANEOUS) ×3 IMPLANT
GAUZE 4X4 16PLY RFD (DISPOSABLE) ×3 IMPLANT
GLOVE SURG ORTHO 8.0 STRL STRW (GLOVE) ×3 IMPLANT
GOWN STRL REUS W/TWL XL LVL3 (GOWN DISPOSABLE) ×9 IMPLANT
HEMOSTAT SURGICEL 2X4 FIBR (HEMOSTASIS) IMPLANT
ILLUMINATOR WAVEGUIDE N/F (MISCELLANEOUS) IMPLANT
KIT BASIN OR (CUSTOM PROCEDURE TRAY) ×3 IMPLANT
KIT TURNOVER KIT A (KITS) IMPLANT
NDL HYPO 25X1 1.5 SAFETY (NEEDLE) ×1 IMPLANT
NEEDLE HYPO 25X1 1.5 SAFETY (NEEDLE) ×3 IMPLANT
PACK BASIC VI WITH GOWN DISP (CUSTOM PROCEDURE TRAY) ×3 IMPLANT
SUT MNCRL AB 4-0 PS2 18 (SUTURE) ×3 IMPLANT
SUT VIC AB 3-0 SH 18 (SUTURE) ×3 IMPLANT
SYR BULB IRRIGATION 50ML (SYRINGE) ×3 IMPLANT
SYR CONTROL 10ML LL (SYRINGE) ×3 IMPLANT
TOWEL OR 17X26 10 PK STRL BLUE (TOWEL DISPOSABLE) ×3 IMPLANT
TOWEL OR NON WOVEN STRL DISP B (DISPOSABLE) ×3 IMPLANT
TUBING CONNECTING 10 (TUBING) ×2 IMPLANT
TUBING CONNECTING 10' (TUBING) ×1

## 2019-05-12 NOTE — Anesthesia Procedure Notes (Signed)
Procedure Name: Intubation Date/Time: 05/12/2019 10:40 AM Performed by: Anne Fu, CRNA Pre-anesthesia Checklist: Patient identified, Emergency Drugs available, Suction available and Patient being monitored Patient Re-evaluated:Patient Re-evaluated prior to induction Oxygen Delivery Method: Circle system utilized Preoxygenation: Pre-oxygenation with 100% oxygen Induction Type: IV induction Ventilation: Mask ventilation without difficulty Laryngoscope Size: Mac and 4 Grade View: Grade II Tube type: Oral Tube size: 7.0 mm Number of attempts: 1 Airway Equipment and Method: Stylet Placement Confirmation: ETT inserted through vocal cords under direct vision,  positive ETCO2 and breath sounds checked- equal and bilateral Secured at: 21 cm Tube secured with: Tape Dental Injury: Teeth and Oropharynx as per pre-operative assessment  Comments: Loanne Drilling, SRNA intubated, AOI

## 2019-05-12 NOTE — Anesthesia Preprocedure Evaluation (Addendum)
Anesthesia Evaluation  Patient identified by MRN, date of birth, ID band Patient awake    Reviewed: Allergy & Precautions, H&P , NPO status , Patient's Chart, lab work & pertinent test results  Airway Mallampati: III  TM Distance: >3 FB Neck ROM: Full    Dental no notable dental hx. (+) Teeth Intact, Dental Advisory Given   Pulmonary asthma ,    Pulmonary exam normal breath sounds clear to auscultation       Cardiovascular hypertension, Pt. on medications  Rhythm:Regular Rate:Normal     Neuro/Psych negative neurological ROS  negative psych ROS   GI/Hepatic negative GI ROS, Neg liver ROS,   Endo/Other  Hypothyroidism Morbid obesity  Renal/GU Renal InsufficiencyRenal disease  negative genitourinary   Musculoskeletal  (+) Arthritis , Osteoarthritis,    Abdominal   Peds  Hematology negative hematology ROS (+)   Anesthesia Other Findings   Reproductive/Obstetrics negative OB ROS                            Anesthesia Physical Anesthesia Plan  ASA: III  Anesthesia Plan: General   Post-op Pain Management:    Induction: Intravenous  PONV Risk Score and Plan: 4 or greater and Ondansetron, Dexamethasone and Midazolam  Airway Management Planned: Oral ETT  Additional Equipment:   Intra-op Plan:   Post-operative Plan: Extubation in OR  Informed Consent: I have reviewed the patients History and Physical, chart, labs and discussed the procedure including the risks, benefits and alternatives for the proposed anesthesia with the patient or authorized representative who has indicated his/her understanding and acceptance.     Dental advisory given  Plan Discussed with: CRNA  Anesthesia Plan Comments:         Anesthesia Quick Evaluation

## 2019-05-12 NOTE — Transfer of Care (Signed)
Immediate Anesthesia Transfer of Care Note  Patient: Sheila Leach  Procedure(s) Performed: Procedure(s): LEFT SUPERIOR PARATHYROIDECTOMY (Left)  Patient Location: PACU  Anesthesia Type:General  Level of Consciousness:  sedated, patient cooperative and responds to stimulation  Airway & Oxygen Therapy:Patient Spontanous Breathing and Patient connected to face mask oxgen  Post-op Assessment:  Report given to PACU RN and Post -op Vital signs reviewed and stable  Post vital signs:  Reviewed and stable  Last Vitals:  Vitals:   05/12/19 0905  BP: (!) 171/102  Pulse: (!) 106  Resp: 18  Temp: 37 C  SpO2: 16%    Complications: No apparent anesthesia complications

## 2019-05-12 NOTE — Anesthesia Postprocedure Evaluation (Signed)
Anesthesia Post Note  Patient: Sheila Leach  Procedure(s) Performed: LEFT SUPERIOR PARATHYROIDECTOMY (Left Neck)     Patient location during evaluation: PACU Anesthesia Type: General Level of consciousness: awake and alert Pain management: pain level controlled Vital Signs Assessment: post-procedure vital signs reviewed and stable Respiratory status: spontaneous breathing, nonlabored ventilation and respiratory function stable Cardiovascular status: blood pressure returned to baseline and stable Postop Assessment: no apparent nausea or vomiting Anesthetic complications: no    Last Vitals:  Vitals:   05/12/19 1230 05/12/19 1239  BP: (!) 134/105 (!) 131/92  Pulse: 96 96  Resp: 18 15  Temp:    SpO2: 97% 96%    Last Pain:  Vitals:   05/12/19 1230  TempSrc:   PainSc: 5                  Smokey Melott,W. EDMOND

## 2019-05-12 NOTE — Interval H&P Note (Signed)
History and Physical Interval Note:  05/12/2019 10:14 AM  Sheila Leach  has presented today for surgery, with the diagnosis of PRIMARY HYPERPRARATHYROIDISM.  The various methods of treatment have been discussed with the patient and family. After consideration of risks, benefits and other options for treatment, the patient has consented to    Procedure(s): LEFT SUPERIOR PARATHYROIDECTOMY (Left) as a surgical intervention.    The patient's history has been reviewed, patient examined, no change in status, stable for surgery.  I have reviewed the patient's chart and labs.  Questions were answered to the patient's satisfaction.    Armandina Gemma, [place] [facility] Office: [place]

## 2019-05-12 NOTE — Op Note (Signed)
OPERATIVE REPORT - PARATHYROIDECTOMY  Preoperative diagnosis: Primary hyperparathyroidism  Postop diagnosis: Same  Procedure: Left superior minimally invasive parathyroidectomy  Surgeon:  Armandina Gemma, MD  Anesthesia: General endotracheal  Estimated blood loss: Minimal  Preparation: ChloraPrep  Indications: Patient is referred by Dr. Shon Baton for surgical evaluation and management of hypercalcemia and suspected primary hyperparathyroidism. Patient's urologist is Dr. Nicolette Bang. Patient has a long-standing history of hypercalcemia dating back many years. She had undergone a nuclear medicine parathyroid scan in the fall of 2016 which localized a potential left inferior parathyroid adenoma. Apparently no further intervention was performed. Patient also has a history of nephrolithiasis. She has chronic kidney disease. She has osteopenia. She complains of chronic fatigue. Patient has a significant endocrine history having undergone thyroid surgery at the [facility] many years ago. She also underwent treatment with radioactive iodine for hyperthyroidism. I do not have details related to those procedures. Patient now presents on referral from her primary care physician for evaluation of suspected primary hyperparathyroidism. Recent calcium level was markedly elevated at 13.2. Intact PTH level was elevated at 152. Sestamibi and MRI localize an enlarged parathyroid gland to the left superior position.  The thyroid is surgically absent.  Patient now comes to surgery for parathyroidectomy.  Procedure: The patient was prepared in the pre-operative holding area. The patient was brought to the operating room and placed in a supine position on the operating room table. Following administration of general anesthesia, the patient was positioned and then prepped and draped in the usual strict aseptic fashion. After ascertaining that an adequate level of anesthesia  been achieved, a neck incision was made with a #15 blade. Dissection was carried through subcutaneous tissues and platysma. Hemostasis was obtained with the electrocautery. Skin flaps were developed circumferentially and a Weitlander retractor was placed for exposure.  Strap muscles were incised in the midline. Strap muscles were reflected laterally. With gentle blunt dissection the strap muscles were mobilized.  Dissection was carried through adipose tissue and the carotid artery was indentified. With palpation, an enlarged parathyroid gland was identified. It was gently mobilized. Vascular structures were divided between small ligaclips. Care was taken to avoid the recurrent laryngeal nerve and the esophagus. The parathyroid gland was completely excised. It was submitted to pathology where frozen section confirmed parathyroid tissue consistent with adenoma.  Neck was irrigated with warm saline and good hemostasis was noted. Fibrillar was placed in the operative field. Strap muscles were approximated in the midline with interrupted 3-0 Vicryl sutures. Platysma was closed with interrupted 3-0 Vicryl sutures. Marcaine was infiltrated circumferentially. Skin was closed with a running 4-0 Monocryl subcuticular suture. Wound was washed and dried and Dermabond was applied. Patient was awakened from anesthesia and brought to the recovery room. The patient tolerated the procedure well.   Armandina Gemma, MD [facility] Office: [phone]

## 2019-05-13 ENCOUNTER — Encounter (HOSPITAL_COMMUNITY): Payer: Self-pay | Admitting: Surgery

## 2019-05-15 LAB — SURGICAL PATHOLOGY

## 2019-10-05 ENCOUNTER — Other Ambulatory Visit (HOSPITAL_COMMUNITY): Payer: Self-pay | Admitting: Registered Nurse

## 2019-10-05 DIAGNOSIS — I10 Essential (primary) hypertension: Secondary | ICD-10-CM

## 2019-10-05 DIAGNOSIS — I517 Cardiomegaly: Secondary | ICD-10-CM

## 2019-10-05 DIAGNOSIS — R0602 Shortness of breath: Secondary | ICD-10-CM

## 2019-10-09 ENCOUNTER — Telehealth (HOSPITAL_COMMUNITY): Payer: Self-pay | Admitting: Internal Medicine

## 2019-10-09 NOTE — Telephone Encounter (Signed)
Patient called the office this am after returning a couple of calls from me and declined to schedule Echocardigram that [employer] scheduled. She will follow up with her dr at next appoinment and if he wants her to have she will, she did not feel that PA knew her history very well.

## 2019-10-20 ENCOUNTER — Other Ambulatory Visit: Payer: Medicare Other

## 2019-10-20 ENCOUNTER — Other Ambulatory Visit: Payer: Self-pay | Admitting: Internal Medicine

## 2019-10-20 ENCOUNTER — Emergency Department (HOSPITAL_COMMUNITY): Payer: Medicare Other

## 2019-10-20 ENCOUNTER — Other Ambulatory Visit: Payer: Self-pay

## 2019-10-20 ENCOUNTER — Emergency Department (HOSPITAL_COMMUNITY)
Admission: EM | Admit: 2019-10-20 | Discharge: 2019-10-20 | Disposition: A | Discharge status: Home / Self Care | Payer: Medicare Other | Attending: Emergency Medicine | Admitting: Emergency Medicine

## 2019-10-20 ENCOUNTER — Encounter (HOSPITAL_COMMUNITY): Payer: Self-pay

## 2019-10-20 DIAGNOSIS — J45909 Unspecified asthma, uncomplicated: Secondary | ICD-10-CM | POA: Insufficient documentation

## 2019-10-20 DIAGNOSIS — Z79899 Other long term (current) drug therapy: Secondary | ICD-10-CM | POA: Insufficient documentation

## 2019-10-20 DIAGNOSIS — R0602 Shortness of breath: Secondary | ICD-10-CM | POA: Diagnosis not present

## 2019-10-20 DIAGNOSIS — R609 Edema, unspecified: Secondary | ICD-10-CM | POA: Insufficient documentation

## 2019-10-20 DIAGNOSIS — R339 Retention of urine, unspecified: Secondary | ICD-10-CM | POA: Diagnosis present

## 2019-10-20 DIAGNOSIS — I10 Essential (primary) hypertension: Secondary | ICD-10-CM | POA: Diagnosis not present

## 2019-10-20 DIAGNOSIS — N3000 Acute cystitis without hematuria: Secondary | ICD-10-CM

## 2019-10-20 DIAGNOSIS — E039 Hypothyroidism, unspecified: Secondary | ICD-10-CM | POA: Insufficient documentation

## 2019-10-20 DIAGNOSIS — R188 Other ascites: Secondary | ICD-10-CM

## 2019-10-20 DIAGNOSIS — R1010 Upper abdominal pain, unspecified: Secondary | ICD-10-CM

## 2019-10-20 DIAGNOSIS — R140 Abdominal distension (gaseous): Secondary | ICD-10-CM

## 2019-10-20 DIAGNOSIS — R110 Nausea: Secondary | ICD-10-CM

## 2019-10-20 LAB — CBC WITH DIFFERENTIAL/PLATELET
Abs Immature Granulocytes: 0.03 10*3/uL (ref 0.00–0.07)
Basophils Absolute: 0 10*3/uL (ref 0.0–0.1)
Basophils Relative: 0 %
Eosinophils Absolute: 0.1 10*3/uL (ref 0.0–0.5)
Eosinophils Relative: 2 %
HCT: 47 % — ABNORMAL HIGH (ref 36.0–46.0)
Hemoglobin: 13.8 g/dL (ref 12.0–15.0)
Immature Granulocytes: 0 %
Lymphocytes Relative: 17 %
Lymphs Abs: 1.2 10*3/uL (ref 0.7–4.0)
MCH: 27.1 pg (ref 26.0–34.0)
MCHC: 29.4 g/dL — ABNORMAL LOW (ref 30.0–36.0)
MCV: 92.2 fL (ref 80.0–100.0)
Monocytes Absolute: 0.5 10*3/uL (ref 0.1–1.0)
Monocytes Relative: 7 %
Neutro Abs: 5.3 10*3/uL (ref 1.7–7.7)
Neutrophils Relative %: 74 %
Platelets: 283 10*3/uL (ref 150–400)
RBC: 5.1 MIL/uL (ref 3.87–5.11)
RDW: 14.2 % (ref 11.5–15.5)
WBC: 7.2 10*3/uL (ref 4.0–10.5)
nRBC: 0 % (ref 0.0–0.2)

## 2019-10-20 LAB — PROTIME-INR
INR: 1.2 (ref 0.8–1.2)
Prothrombin Time: 14.7 seconds (ref 11.4–15.2)

## 2019-10-20 LAB — URINALYSIS, ROUTINE W REFLEX MICROSCOPIC
Bilirubin Urine: NEGATIVE
Glucose, UA: NEGATIVE mg/dL
Hgb urine dipstick: NEGATIVE
Ketones, ur: NEGATIVE mg/dL
Nitrite: NEGATIVE
Protein, ur: >=300 mg/dL — AB
Specific Gravity, Urine: 1.014 (ref 1.005–1.030)
WBC, UA: >50 WBC/hpf — ABNORMAL HIGH (ref 0–5)
pH: 6 (ref 5.0–8.0)

## 2019-10-20 LAB — LIPASE, BLOOD: Lipase: 40 U/L (ref 11–51)

## 2019-10-20 LAB — COMPREHENSIVE METABOLIC PANEL
ALT: 16 U/L (ref 0–44)
AST: 25 U/L (ref 15–41)
Albumin: 4.4 g/dL (ref 3.5–5.0)
Alkaline Phosphatase: 33 U/L — ABNORMAL LOW (ref 38–126)
Anion gap: 13 (ref 5–15)
BUN: 32 mg/dL — ABNORMAL HIGH (ref 8–23)
CO2: 24 mmol/L (ref 22–32)
Calcium: 9.2 mg/dL (ref 8.9–10.3)
Chloride: 106 mmol/L (ref 98–111)
Creatinine, Ser: 2.66 mg/dL — ABNORMAL HIGH (ref 0.44–1.00)
GFR calc Af Amer: 21 mL/min — ABNORMAL LOW (ref >60)
GFR calc non Af Amer: 18 mL/min — ABNORMAL LOW (ref >60)
Glucose, Bld: 129 mg/dL — ABNORMAL HIGH (ref 70–99)
Potassium: 3.7 mmol/L (ref 3.5–5.1)
Sodium: 143 mmol/L (ref 135–145)
Total Bilirubin: 1 mg/dL (ref 0.3–1.2)
Total Protein: 7.1 g/dL (ref 6.5–8.1)

## 2019-10-20 LAB — T4, FREE: Free T4: 1.83 ng/dL — ABNORMAL HIGH (ref 0.61–1.12)

## 2019-10-20 LAB — LACTIC ACID, PLASMA
Lactic Acid, Venous: 2.5 mmol/L (ref 0.5–1.9)
Lactic Acid, Venous: 2 mmol/L (ref 0.5–1.9)

## 2019-10-20 LAB — CORTISOL: Cortisol, Plasma: 35 ug/dL

## 2019-10-20 LAB — APTT: aPTT: 28 seconds (ref 24–36)

## 2019-10-20 LAB — TSH: TSH: 24.305 u[IU]/mL — ABNORMAL HIGH (ref 0.350–4.500)

## 2019-10-20 MED ORDER — CEPHALEXIN 500 MG PO CAPS: 500.0000 mg | ORAL_CAPSULE | Freq: Four times a day (QID) | ORAL | 0 refills | Status: DC

## 2019-10-20 MED ORDER — ONDANSETRON 4 MG PO TBDP: 4.0000 mg | ORAL_TABLET | Freq: Three times a day (TID) | ORAL | 0 refills | Status: AC | PRN

## 2019-10-20 MED ORDER — SODIUM CHLORIDE 0.9 % IV SOLN
2.0000 g | Freq: Once | INTRAVENOUS | Status: AC
  Administered 2019-10-20: 2 g via INTRAVENOUS
  Filled 2019-10-20: qty 20

## 2019-10-20 MED ORDER — ONDANSETRON HCL 4 MG/2ML IJ SOLN
4.0000 mg | Freq: Once | INTRAMUSCULAR | Status: AC
  Administered 2019-10-20: 4 mg via INTRAVENOUS
  Filled 2019-10-20: qty 2

## 2019-10-20 MED ORDER — SODIUM CHLORIDE 0.9 % IV SOLN: 1.0000 g | Freq: Once | INTRAVENOUS | Status: DC

## 2019-10-20 MED ORDER — SODIUM CHLORIDE 0.9 % IV SOLN: Freq: Once | INTRAVENOUS | Status: AC

## 2019-10-20 MED ORDER — HYDRALAZINE HCL 10 MG PO TABS
10.0000 mg | ORAL_TABLET | Freq: Once | ORAL | Status: AC
  Administered 2019-10-20: 10 mg via ORAL
  Filled 2019-10-20: qty 1

## 2019-10-20 NOTE — ED Triage Notes (Addendum)
Patient reports that she has had fluid retention x 3 weeks. Patient states breathing worse when she lies down, increased swelling to bilateral lower extremities.  Patient states she has been to her PCP twice in the past 3 weeks for the same. Patient states she has gained 24 lbs in the past 3 weeks.

## 2019-10-20 NOTE — ED Provider Notes (Addendum)
Sheila Leach   CSN: [id number] Arrival date & time: 10/20/19  9379     History Chief Complaint  Patient presents with  . fluid retention  . Bloated    Sheila Leach is a 64 y.o. female.  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  Patient has retained nephrostomy tube on the left side and retained ureteral stent on the right, asthma, hypertension, hyperlipidemia, hyperparathyroidism status post surgical removal.  S/p thyroid and parathyroidectomy  Patient is presented today with complaints of diffuse lower extremity and abdominal swelling, shortness of breath when reclined or lying down, swelling in her abdomen and 24 pounds of weight gain in the past 3 weeks which was unintentional.  Patient states that she was seen by her PCP recently who was sent to ED for further evaluation.  Patient states that prior to 3 weeks ago she was diagnosed with bronchitis and noticing swelling after she was beginning to improve.  Patient states that she is urinating as normal with no hematuria, frequency urgency or dysuria.  She states that she is drinking plenty of water and urinates at least every 2 hours.  She has no history of diabetes.  Patient has a history of parathyroidectomy had entire thyroid removed at that time is on 325 mcg of levothyroxine replacement.  She states that she takes her medication daily.  Has not taken her medications today including her blood pressure medication.  She denies any chest pain, headache, lightheadedness, dizziness, abdominal pain.  Patient denies any headaches, vision changes, blurry vision, double vision, mood swings.    Past Medical History:  Diagnosis Date  . Asthma    inhaler prn  . Bilateral ureteral calculi    10-21-2017 currently has retained right ureteral stent and left nephrostomy tube in place  . Cancer [facility])     uterine tumor  . Complication of anesthesia    trouble breathing after anesthesia didnt know had asthma at the time  . DDD (degenerative disc disease), lumbar    pt adenies at preop  . History of acute renal failure 09/19/2017   hospital admission --- hydronephrosis ,  ureteral stones  . 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 History of kidney stones   . Hx of radiation therapy 6/6/, 6/21, 6/24, 02/07/2009   post operative vaginal cuff branii therapy  . Hyperlipidemia   . Hypertension    10-21-2017  per pt was on lisinopril prior to hospital admission 02/ 2019 she become hypotensive so lisinopril was discontinued  . Hypothyroidism, postsurgical FOLLOWED BY PCP   10-21-2017 per pt dx thyroid goiter at age 27 (age 32 s/p  partial thyroidectomy), age 43 thryoid greatly increased in size felt to be pre-cancerous (s/p  total thyroidectomy)  no radiation or chemo,  no recurrence  . Left sided sciatica    10-21-2017  per pt nerve injury during hysterectomy surgery 04/ 2010  ,  left leg weaker than right but gait issue  pt. denies  . Osteoporosis   . Personal history of colonic adenomas 01/24/2013  . Renal insufficiency    recent aucte renal  failure due to ureteral obstructive secondary to kidney stones/ hyronephrosis 09-19-2017   . Seasonal allergies     Patient Active Problem List   Diagnosis Date Noted  . Hyperparathyroidism, primary ([place]) 05/07/2019  . AKI (acute kidney injury) ([place]) 09/19/2017  . HTN (hypertension) 09/19/2017  . Hydronephrosis with renal and ureteral calculus obstruction 09/19/2017  . Hematuria 09/19/2017  . Acute lower UTI 09/19/2017  . UTI (urinary  tract infection) 09/19/2017  . Endometrial cancer ([place])   . Personal history of colonic adenomas 01/24/2013  . Malignant neoplasm of corpus uteri, except isthmus ([place]) 03/02/2012    Past Surgical History:  Procedure Laterality Date  . CESAREAN SECTION  1988  . CYSTOSCOPY W/ URETERAL STENT PLACEMENT Bilateral 09/19/2017   Procedure: CYSTOSCOPY WITH BILATERAL RETROGRADE PYELOGRAM/RIGHT URETERAL STENT PLACEMENT;  Surgeon: Cleon Gustin, MD;  Location: [facility];  Service: Urology;  Laterality: Bilateral;  . CYSTOSCOPY WITH RETROGRADE PYELOGRAM, URETEROSCOPY AND STENT PLACEMENT Bilateral 10/29/2017   Procedure: CYSTOSCOPY WITH RETROGRADE PYELOGRAM, URETEROSCOPY AND STENT PLACEMENT, REMOVAL LEFT NEPHROSTOMY TUBE;  Surgeon: Cleon Gustin, MD;  Location: [facility];  Service: Urology;  Laterality: Bilateral;  . DILATION AND CURETTAGE OF UTERUS  1996   w/ resectoscopic resection fibroids and Dx Laparoscopy  . HOLMIUM LASER APPLICATION Bilateral 3/71/6967   Procedure: HOLMIUM LASER APPLICATION;  Surgeon: Cleon Gustin, MD;  Location: [facility];  Service: Urology;  Laterality: Bilateral;  . IR NEPHROSTOMY PLACEMENT LEFT  09/20/2017  . PARATHYROIDECTOMY Left 05/12/2019   Procedure: LEFT SUPERIOR PARATHYROIDECTOMY;  Surgeon: Armandina Gemma, MD;  Location: [facility];  Service: General;  Laterality: Left;  . THYROIDECTOMY, PARTIAL  age 55  . TONSILLECTOMY  age 37  . TOTAL ABDOMINAL HYSTERECTOMY W/ BILATERAL SALPINGOOPHORECTOMY  11-27-2008   dr Alycia Rossetti  [facility]   w/ Bilateral Pelvic Lymph Node Dissection's   . TOTAL THYROIDECTOMY  age 48   pre -cancerous  . TRANSTHORACIC ECHOCARDIOGRAM  09/21/2017   mild concentric LVH, ef 50-55%/  trivial MR and TR     OB History   No obstetric history on file.     Family History  Problem Relation Age of Onset  . Heart disease Mother   . Diabetes Mother   . Kidney disease Mother   . Heart disease Father   . Thyroid cancer  Sister   . Esophageal cancer Brother   . Diabetes Brother   . Liver disease Brother   . Breast cancer Other   . Colon cancer Neg Hx   . Rectal cancer Neg Hx   . Stomach cancer Neg Hx     Social History   Tobacco Use  . Smoking status: Never Smoker  . Smokeless tobacco: Never Used  Substance Use Topics  . Alcohol use: No  . Drug use: No    Home Medications Prior to Admission medications   Medication Sig Start Date End Date Taking? Authorizing Provider  acetaminophen (TYLENOL) 500 MG tablet Take 1,000 mg by mouth every 6 (six) hours as needed for moderate pain.   Yes [provider]  albuterol (PROVENTIL HFA;VENTOLIN HFA) 108 (90 BASE) MCG/ACT inhaler Inhale 2 puffs into the lungs every 6 (six) hours as needed for wheezing or shortness of breath.   Yes [provider]  cetirizine (ZYRTEC) 10 MG tablet Take 10 mg by mouth every morning. 24 hour tab   Yes [provider]  fenofibrate 160 MG tablet Take 160 mg by mouth every morning.  09/15/17  Yes  [provider]  levothyroxine (SYNTHROID) 175 MCG tablet Take 350 mcg by mouth daily before breakfast.   Yes [provider]  Multiple Vitamins-Minerals (MULTIVITAMIN WITH MINERALS) tablet Take 1 tablet by mouth daily.   Yes [provider]  polyethylene glycol (MIRALAX / GLYCOLAX) packet Take 17 g by mouth daily as needed for moderate constipation. 09/26/17  Yes Oswald Hillock, MD  sodium bicarbonate 650 MG tablet Take 1,300 mg by mouth daily.   Yes [provider]  cephALEXin (KEFLEX) 500 MG capsule Take 1 capsule (500 mg total) by mouth 4 (four) times daily. 10/20/19   Tedd Sias, PA  HYDROcodone-acetaminophen (NORCO/VICODIN) 5-325 MG tablet Take 1 tablet by mouth every 4 (four) hours as needed for moderate pain. Patient not taking: Reported on 05/05/2019 10/29/17   Cleon Gustin, MD  ondansetron (ZOFRAN ODT) 4 MG disintegrating tablet Take 1 tablet (4 mg total) by mouth  every 8 (eight) hours as needed for nausea or vomiting. 10/20/19   Tedd Sias, PA  traMADol (ULTRAM) 50 MG tablet Take 1-2 tablets (50-100 mg total) by mouth every 6 (six) hours as needed. Patient not taking: Reported on 10/20/2019 05/12/19   Armandina Gemma, MD    Allergies    Betadine [povidone iodine], Contrast media [iodinated diagnostic agents], and Adhesive [tape]  Review of Systems   Review of Systems  Constitutional: Positive for fatigue and unexpected weight change. Negative for chills and fever.  HENT: Negative for congestion.   Eyes: Negative for pain.  Respiratory: Positive for shortness of breath. Negative for cough.   Cardiovascular: Negative for chest pain and leg swelling.  Gastrointestinal: Positive for nausea. Negative for abdominal pain and vomiting.  Genitourinary: Negative for dysuria, menstrual problem and vaginal pain.  Musculoskeletal: Negative for myalgias.  Skin: Negative for rash.  Neurological: Negative for dizziness and headaches.    Physical Exam Updated Vital Signs BP (!) 163/98 (BP Location: Left Arm)   Pulse (!) 102   Temp 98 F (36.7 C) (Oral)   Resp 18   Ht 5\' 7"  (1.702 m)   Wt 135.6 kg   SpO2 99%   BMI 46.83 kg/m   Physical Exam Vitals and nursing Leach reviewed.  Constitutional:      General: She is not in acute distress.    Appearance: She is obese.     Comments: Morbidly obese 64 year old female in no acute distress but appears somewhat fatigued.   HENT:     Head: Normocephalic and atraumatic.     Nose: Nose normal.     Mouth/Throat:     Mouth: Mucous membranes are dry.  Eyes:     General: No scleral icterus.    Comments: Visual fields tested in bilateral eyes with no visual field cuts.  EOMI, PERRLA.  Swinging light test negative.  Cardiovascular:     Rate and Rhythm: Regular rhythm. Tachycardia present.     Pulses: Normal pulses.     Heart sounds: Normal heart sounds.     Comments: Heart rate 114 Pulses bilaterally 3+ and  symmetric: Radial, DP, PT Pulmonary:     Effort: Pulmonary effort is normal. No respiratory distress.     Breath sounds: No wheezing.  Abdominal:     Palpations: Abdomen is soft.     Tenderness: There is no abdominal tenderness. There is no right CVA tenderness, left CVA tenderness, guarding or rebound.     Comments: Morbidly obese abdomen with large pannus.  No tenderness to palpation.  Intertriginous area below pannus with white powder present.  Musculoskeletal:     Cervical back: Normal range of motion.     Right lower leg: Edema present.     Left lower leg: Edema present.     Comments: Bilateral lower extremity edema to the level of the shin.  Skin:    General: Skin is warm and dry.     Capillary Refill: Capillary refill takes less than 2 seconds.  Neurological:     Mental Status: She is alert. Mental status is at baseline.  Psychiatric:        Mood and Affect: Mood normal.        Behavior: Behavior normal.     ED Results / Procedures / Treatments   Labs (all labs ordered are listed, but only abnormal results are displayed) Labs Reviewed  CBC WITH DIFFERENTIAL/PLATELET - Abnormal; Notable for the following components:      Result Value   HCT 47.0 (*)    MCHC 29.4 (*)    All other components within normal limits  TSH - Abnormal; Notable for the following components:   TSH 24.305 (*)    All other components within normal limits  LACTIC ACID, PLASMA - Abnormal; Notable for the following components:   Lactic Acid, Venous 2.5 (*)    All other components within normal limits  LACTIC ACID, PLASMA - Abnormal; Notable for the following components:   Lactic Acid, Venous 2.0 (*)    All other components within normal limits  COMPREHENSIVE METABOLIC PANEL - Abnormal; Notable for the following components:   Glucose, Bld 129 (*)    BUN 32 (*)    Creatinine, Ser 2.66 (*)    Alkaline Phosphatase 33 (*)    GFR calc non Af Amer 18 (*)    GFR calc Af Amer 21 (*)    All other  components within normal limits  URINALYSIS, ROUTINE W REFLEX MICROSCOPIC - Abnormal; Notable for the following components:   APPearance HAZY (*)    Protein, ur >=300 (*)    Leukocytes,Ua LARGE (*)    WBC, UA >50 (*)    Bacteria, UA RARE (*)    All other components within normal limits  T4, FREE - Abnormal; Notable for the following components:   Free T4 1.83 (*)    All other components within normal limits  CULTURE, BLOOD (ROUTINE X 2)  CULTURE, BLOOD (ROUTINE X 2)  URINE CULTURE  SARS CORONAVIRUS 2 (TAT 6-24 HRS)  CORTISOL  LIPASE, BLOOD  APTT  PROTIME-INR    EKG EKG Interpretation  Date/Time:  Friday October 20 2019 10:38:50 EDT Ventricular Rate:  109 PR Interval:    QRS Duration: 74 QT Interval:  335 QTC Calculation: 452 R Axis:   -37 Text Interpretation: Sinus tachycardia Sinus pause Left axis deviation Low voltage, precordial leads Consider anterior infarct Nonspecific T abnormalities, lateral leads No significant change since 10/20 Confirmed by Aletta Edouard [phone]) on 10/20/2019 10:45:33 AM   Radiology CT ABDOMEN PELVIS WO CONTRAST  Addendum Date: 10/20/2019   ADDENDUM REPORT: 10/20/2019 14:57 ADDENDUM: Correction to impression needs to be made. The 4 mm calculus is at the left ureteropelvic junction, not the right. These results were discussed by telephone at the time of interpretation on 10/20/2019 at 2:57 pm with provider [facility] , who verbally acknowledged these results. Electronically Signed   By: Marijo Conception M.D.   On: 10/20/2019 14:57   Result Date: 10/20/2019 CLINICAL DATA:  Difficulty breathing, abdominal distention.  EXAM: CT CHEST, ABDOMEN AND PELVIS WITHOUT CONTRAST TECHNIQUE: Multidetector CT imaging of the chest, abdomen and pelvis was performed following the standard protocol without IV contrast. COMPARISON:  September 19, 2017. FINDINGS: CT CHEST FINDINGS Cardiovascular: Atherosclerosis of thoracic aorta is noted without aneurysmal dilatation. Mild  cardiomegaly is noted. Small pericardial effusion is noted. Mediastinum/Nodes: No enlarged mediastinal, hilar, or axillary lymph nodes. Thyroid gland, trachea, and esophagus demonstrate no significant findings. Lungs/Pleura: No pneumothorax is noted. Small left pleural effusion is noted. Right lung is clear. Musculoskeletal: No chest wall mass or suspicious bone lesions identified. CT ABDOMEN PELVIS FINDINGS Hepatobiliary: Mild cholelithiasis is noted. No biliary dilatation is noted. No focal abnormality is noted in the liver on these unenhanced images. Possible nodular hepatic contours are noted suggesting cirrhosis. Pancreas: Unremarkable. No pancreatic ductal dilatation or surrounding inflammatory changes. Spleen: Normal in size without focal abnormality. Adrenals/Urinary Tract: Adrenal glands appear normal. Nonobstructive right nephrolithiasis is noted. Severe left renal atrophy is noted with severe left hydronephrosis due to 4 mm calculus at right ureteropelvic junction. Nonobstructive left renal calculi are noted as well. Urinary bladder is unremarkable. Stomach/Bowel: The stomach appears normal. There is no evidence of bowel obstruction or inflammation. The appendix is not visualized. Vascular/Lymphatic: Aortic atherosclerosis. No enlarged abdominal or pelvic lymph nodes. Reproductive: Status post hysterectomy. No adnexal masses. Other: Mild ascites is noted around the liver and spleen and in the pelvis. Mild anasarca is noted. No definite hernia is noted. Musculoskeletal: No acute or significant osseous findings. IMPRESSION: 1. Severe left renal atrophy is noted with severe left hydronephrosis due to 4 mm calculus at right ureteropelvic junction. Bilateral nephrolithiasis is noted. 2. Mild ascites is noted around the liver and spleen and in the pelvis. Mild anasarca is noted. 3. Possible nodular hepatic contours are noted suggesting cirrhosis. 4. Small pericardial effusion. 5. Mild cholelithiasis. Aortic  Atherosclerosis (ICD10-I70.0). Electronically Signed: By: Marijo Conception M.D. On: 10/20/2019 14:24   CT Chest Wo Contrast  Addendum Date: 10/20/2019   ADDENDUM REPORT: 10/20/2019 14:57 ADDENDUM: Correction to impression needs to be made. The 4 mm calculus is at the left ureteropelvic junction, not the right. These results were discussed by telephone at the time of interpretation on 10/20/2019 at 2:57 pm with provider [facility] , who verbally acknowledged these results. Electronically Signed   By: Marijo Conception M.D.   On: 10/20/2019 14:57   Result Date: 10/20/2019 CLINICAL DATA:  Difficulty breathing, abdominal distention. EXAM: CT CHEST, ABDOMEN AND PELVIS WITHOUT CONTRAST TECHNIQUE: Multidetector CT imaging of the chest, abdomen and pelvis was performed following the standard protocol without IV contrast. COMPARISON:  September 19, 2017. FINDINGS: CT CHEST FINDINGS Cardiovascular: Atherosclerosis of thoracic aorta is noted without aneurysmal dilatation. Mild cardiomegaly is noted. Small pericardial effusion is noted. Mediastinum/Nodes: No enlarged mediastinal, hilar, or axillary lymph nodes. Thyroid gland, trachea, and esophagus demonstrate no significant findings. Lungs/Pleura: No pneumothorax is noted. Small left pleural effusion is noted. Right lung is clear. Musculoskeletal: No chest wall mass or suspicious bone lesions identified. CT ABDOMEN PELVIS FINDINGS Hepatobiliary: Mild cholelithiasis is noted. No biliary dilatation is noted. No focal abnormality is noted in the liver on these unenhanced images. Possible nodular hepatic contours are noted suggesting cirrhosis. Pancreas: Unremarkable. No pancreatic ductal dilatation or surrounding inflammatory changes. Spleen: Normal in size without focal abnormality. Adrenals/Urinary Tract: Adrenal glands appear normal. Nonobstructive right nephrolithiasis is noted. Severe left renal atrophy is noted with severe left hydronephrosis due to 4 mm calculus at  right  ureteropelvic junction. Nonobstructive left renal calculi are noted as well. Urinary bladder is unremarkable. Stomach/Bowel: The stomach appears normal. There is no evidence of bowel obstruction or inflammation. The appendix is not visualized. Vascular/Lymphatic: Aortic atherosclerosis. No enlarged abdominal or pelvic lymph nodes. Reproductive: Status post hysterectomy. No adnexal masses. Other: Mild ascites is noted around the liver and spleen and in the pelvis. Mild anasarca is noted. No definite hernia is noted. Musculoskeletal: No acute or significant osseous findings. IMPRESSION: 1. Severe left renal atrophy is noted with severe left hydronephrosis due to 4 mm calculus at right ureteropelvic junction. Bilateral nephrolithiasis is noted. 2. Mild ascites is noted around the liver and spleen and in the pelvis. Mild anasarca is noted. 3. Possible nodular hepatic contours are noted suggesting cirrhosis. 4. Small pericardial effusion. 5. Mild cholelithiasis. Aortic Atherosclerosis (ICD10-I70.0). Electronically Signed: By: Marijo Conception M.D. On: 10/20/2019 14:24   DG Chest Portable 1 View  Result Date: 10/20/2019 CLINICAL DATA:  Shortness of breath EXAM: PORTABLE CHEST 1 VIEW COMPARISON:  10/12/2017 FINDINGS: Low lung volumes with accentuation of the cardiac silhouette. Hazy bibasilar opacities. No large pleural effusion. No pneumothorax. Overlapping soft tissue structures partially obscure the lung apices. Advanced degenerative changes of the left shoulder. IMPRESSION: Low lung volumes with hazy bibasilar opacities, likely atelectasis. Electronically Signed   By: Davina Poke D.O.   On: 10/20/2019 10:57    Procedures .Critical Care Performed by: Tedd Sias, PA Authorized by: Tedd Sias, PA   Critical care provider statement:    Critical care time (minutes):  35   Critical care time was exclusive of:  Separately billable procedures and treating other patients and teaching time    Critical care was necessary to treat or prevent imminent or life-threatening deterioration of the following conditions: Tachycardia, severe hypertension, lactic acidosis, continuing renal failure.   Critical care was time spent personally by me on the following activities:  Discussions with consultants, evaluation of patient's response to treatment, examination of patient, review of old charts, re-evaluation of patient's condition, pulse oximetry, ordering and review of radiographic studies, ordering and review of laboratory studies, ordering and performing treatments and interventions and discussions with primary provider   I assumed direction of critical care for this patient from another provider in my specialty: no   Comments:     Discussed case with primary care provider x2   (including critical care time)  Medications Ordered in ED Medications  ondansetron (ZOFRAN) injection 4 mg (4 mg Intravenous Given 10/20/19 1121)  0.9 %  sodium chloride infusion ( Intravenous Stopped 10/20/19 1641)  cefTRIAXone (ROCEPHIN) 2 g in sodium chloride 0.9 % 100 mL IVPB (0 g Intravenous Stopped 10/20/19 1641)  hydrALAZINE (APRESOLINE) tablet 10 mg (10 mg Oral Given 10/20/19 1641)    ED Course  I have reviewed the triage vital signs and the nursing notes.  Pertinent labs & imaging results that were available during my care of the patient were reviewed by me and considered in my medical decision making (see chart for details).  Patient is a 64 year old female with a history of obstructive uropathy secondary to bilateral ureteral stones which required stenting.  Patient has retained nephrostomy tube on the left side and retained ureteral stent on the right, asthma, hypertension, hyperlipidemia, hyperparathyroidism status post surgical removal.  S/p thyroid and parathyroidectomy.  She is presented today for fatigue, shortness of breath, nausea, unexpected weight gain of 24 pounds in 3 weeks.  She is seen  regularly by PCP  who states that she has been progressively worsening in appearance over the past month.  PCP plan to obtain CT imaging of abdomen however as patient is not tolerating a supine position due to severe nausea and shortness of breath disposition she was sent to emergency department for imaging.  Given patient's ill appearance abdominal distention with diffuse anasarca and lower extremity peripheral edema primary concerns are for liver disease, kidney disease such as some nephropathy or glomerulonephritis/nephritic/nephrotic syndrome, low suspicion for but similarly concern for CHF no S3 or JVD on PE.  Also some concern for cancerous mass in abdomen or chest may be related to patient's anasarca and may be some vascular obstruction secondary to tumor.  Clinical Course as of Oct 19 2121  Fri Oct 20, 4659  [id number] 64 year old female here with increased peripheral edema abdominal distention over the course of 3 weeks.  Tachycardic here but afebrile.  Morbidly obese.  Pending labs urinalysis and some imaging.  Likely will need to be admitted for further work-up.   [MB]    Clinical Course User Index [MB] Hayden Rasmussen, MD   MDM Rules/Calculators/A&P                       I discussed this case with my attending physician who cosigned this Leach including patient's presenting symptoms, physical exam, and planned diagnostics and interventions. Attending physician stated agreement with plan or made changes to plan which were implemented.   Attending physician assessed patient at bedside. Recommended admission.   Discussed with Dr. Wynelle Cleveland of internal medicine service-appreciate her consultation.  I discussed the lab results, patient presentation, CT imaging and patient's history and physical exam.  Dr. Wynelle Cleveland recommends discharge with close follow-up with PCP.  Recommends outpatient p.o. antibiotics in the form of Keflex.  Patient not currently hypoxic recommended PCP follow-up for initiation of  diuretic such as Lasix.  I discussed with patient need for follow-up with PCP to review imaging studies and lab work done today as well as to initiate diuretic and follow-up on thyroid studies which are pending at time of discharge.  Patient does have resulted TSH which is elevated consistent with hypothyroidism pending free T4.  Patient is already on significant thyroid dose she is tachycardic at this time she does not appear overly lethargic and her presentation is not consistent with myxedema coma.  I called Dr. Virgina Jock which is patient's PCP who has been available and helpful during entirety of patient's evaluation and work-up.  He states that he will see patient in close follow-up.  States that he will have his office call patient Monday morning to arrange this.  I offered admission the patient she states that she prefer to be discharged this time.  She agrees to return to ED if she has any new or concerning symptoms.  Otherwise she will follow closely with her PCP Dr. Virgina Jock.   -------------------------------- The medical records were personally reviewed by myself. I personally reviewed all lab results and interpreted all imaging studies and either concurred with their official read or contacted radiology for clarification.   This patient appears reasonably screened and I doubt any other medical condition requiring further workup, evaluation, or treatment in the ED at this time prior to discharge.   Patient's vitals are WNL apart from vital sign abnormalities discussed above, patient is in NAD, and able to ambulate in the ED at their baseline and able to tolerate PO.  Pain has been managed or a plan  has been made for home management and has no complaints prior to discharge. Patient is comfortable with above plan and for discharge at this time. All questions were answered prior to disposition. Results from the ER workup discussed with the patient face to face and all questions answered to the best  of my ability. The patient is safe for discharge with strict return precautions. Patient appears safe for discharge with appropriate follow-up. Conveyed my impression with the patient and they voiced understanding and are agreeable to plan.   An After Visit Summary was printed and given to the patient.  Portions of this Leach were generated with [employer]. Dictation errors may occur despite best attempts at proofreading.    Final Clinical Impression(s) / ED Diagnoses Final diagnoses:  Acute cystitis without hematuria  Nausea  Abdominal bloating  Other ascites  Peripheral edema    Rx / DC Orders ED Discharge Orders         Ordered    ondansetron (ZOFRAN ODT) 4 MG disintegrating tablet  Every 8 hours PRN     10/20/19 1617    cephALEXin (KEFLEX) 500 MG capsule  4 times daily     10/20/19 1617           Tedd Sias, [place] 10/20/19 2147    Tedd Sias, [place] 10/20/19 2149    Hayden Rasmussen, MD 10/21/19 1029

## 2019-10-20 NOTE — Progress Notes (Signed)
I spoke with Pati Gallo, PA about this patient for admission in regards to dyspnea, fluid overload, mild tachycardia, Lactic acid of 2.5 and UTI. CT chest, abd/pelvis w/o contrast reveals: 1. Severe left renal atrophy is noted with severe left hydronephrosis due to 4 mm calculus at right ureteropelvic junction. Bilateral nephrolithiasis is noted. 2. Mild ascites is noted around the liver and spleen and in the pelvis. Mild anasarca is noted. 3. Possible nodular hepatic contours are noted suggesting cirrhosis. 4. Small pericardial effusion. 5. Mild cholelithiasis.   I recommended obtaining a  f/u Lactic acid, starting an oral antibiotic for the UTI which is not renally excreted and close f/u with PCP. In regards to fluid overload, she is currently not hypoxic and has cirrhosis and CKD 4, both of which may be causing the edema. I do recommend an outpatient ECHO for further work up and eventual start of a diurectic. She also has and elevated TSH of 24.305 and will need her Synthroid dose adjusted.   Debbe Odea, MD

## 2019-10-20 NOTE — ED Notes (Signed)
Sheila Leach, son, [phone], wants an update on his mother.

## 2019-10-20 NOTE — ED Notes (Deleted)
Unable to get blood cultures prior to starting antibiotics.

## 2019-10-20 NOTE — ED Notes (Signed)
RN unable to get blood samples. Tech will try.

## 2019-10-20 NOTE — ED Notes (Signed)
EMT AND RN TRIED TOGETHER TO GET BLADDER SCAN RESULTS UNABLE TO OBTAIN DUE TO PATIENT'S STOMACH SIZE

## 2019-10-20 NOTE — Discharge Instructions (Addendum)
Please take antibiotics as prescribed.  Please follow-up with your primary care doctor.  Please call Monday to make an appointment.  Please use Zofran for nausea.  Please return to ED for any new or concerning symptoms.

## 2019-10-22 LAB — URINE CULTURE: Culture: >=100000 — AB

## 2019-10-23 ENCOUNTER — Telehealth: Payer: Self-pay | Admitting: Emergency Medicine

## 2019-10-23 NOTE — Progress Notes (Signed)
ED Antimicrobial Stewardship Positive Culture Follow Up   Sheila Leach is an 64 y.o. female who presented to [facility] on 10/20/2019 with a chief complaint of  Chief Complaint  Patient presents with  . fluid retention  . Bloated    Recent Results (from the past 720 hour(s))  Urine culture     Status: Abnormal   Collection Time: 10/20/19 12:16 PM   Specimen: In/Out Cath Urine  Result Value Ref Range Status   Specimen Description   Final    IN/OUT CATH URINE Performed at [facility] [address], [place], [place] [address]    Special Requests   Final    NONE Performed at [facility], [place] [address], [place], [place] [address]    Culture >=100,000 COLONIES/mL STAPHYLOCOCCUS EPIDERMIDIS (A)  Final   Report Status 10/22/2019 FINAL  Final   Organism ID, Bacteria STAPHYLOCOCCUS EPIDERMIDIS (A)  Final      Susceptibility   Staphylococcus epidermidis - MIC*    CIPROFLOXACIN >=8 RESISTANT Resistant     GENTAMICIN <=0.5 SENSITIVE Sensitive     NITROFURANTOIN <=16 SENSITIVE Sensitive     OXACILLIN >=4 RESISTANT Resistant     TETRACYCLINE <=1 SENSITIVE Sensitive     VANCOMYCIN 1 SENSITIVE Sensitive     TRIMETH/SULFA <=10 SENSITIVE Sensitive     CLINDAMYCIN <=0.25 SENSITIVE Sensitive     RIFAMPIN <=0.5 SENSITIVE Sensitive     Inducible Clindamycin NEGATIVE Sensitive     * >=100,000 COLONIES/mL STAPHYLOCOCCUS EPIDERMIDIS  Blood Culture (routine x 2)     Status: None (Preliminary result)   Collection Time: 10/20/19  2:50 PM   Specimen: BLOOD LEFT HAND  Result Value Ref Range Status   Specimen Description   Final    BLOOD LEFT HAND Performed at [facility] [address] [place], [place] [address]    Special Requests   Final    BOTTLES DRAWN AEROBIC ONLY Blood Culture results may not be optimal due to an inadequate volume of blood received in culture bottles Performed at [facility] [address], [place], [place] [address]    Culture   Final    NO GROWTH 2 DAYS Performed at [facility] [address] [place], [place] [address]    Report Status PENDING  Incomplete  Blood Culture (routine x 2)     Status: None (Preliminary result)   Collection Time: 10/20/19  2:57 PM   Specimen: BLOOD RIGHT HAND  Result Value Ref Range Status   Specimen Description   Final    BLOOD RIGHT HAND Performed at [facility] [address], [place], [place] [address]    Special Requests   Final    BOTTLES DRAWN AEROBIC ONLY Blood Culture adequate volume Performed at [facility] [address], [place], [place] [address]    Culture   Final    NO GROWTH 2 DAYS Performed at [facility] [address] [place], [place] [address]    Report Status PENDING  Incomplete    [x]  Treated with cephalexin, organism not tested to prescribed antimicrobial []  Patient discharged originally without antimicrobial agent and treatment is now indicated  New antibiotic prescription: Bactrim DS 1 tab PO BID x 7 days  ED Provider: Delia Heady, PA    Royetta Asal, PharmD, BCPS 10/23/2019 11:44 AM

## 2019-10-23 NOTE — Telephone Encounter (Signed)
Post ED Visit - Positive Culture Follow-up: Successful Patient Follow-Up  Culture assessed and recommendations reviewed by:  []  Elenor Quinones, Pharm.D. []  Heide Guile, Pharm.D., BCPS AQ-ID []  Parks Neptune, Pharm.D., BCPS []  Alycia Rossetti, Pharm.D., BCPS []  [place], Pharm.D., BCPS, AAHIVP []  Legrand Como, Pharm.D., BCPS, AAHIVP []  Salome Arnt, PharmD, BCPS []  Johnnette Gourd, PharmD, BCPS []  Hughes Better, PharmD, BCPS []  Leeroy Cha, PharmD Maryjean Ka PharmD  Positive urineculture  []  Patient discharged without antimicrobial prescription and treatment is now indicated [x]  Organism is resistant to prescribed ED discharge antimicrobial []  Patient with positive blood cultures  Changes discussed with ED provider: Delia Heady PA New antibiotic prescription stop Cephalexin, start bactrim DS 1 tab po bid x 2 days  Attempting to contact patient   Hazle Nordmann 10/23/2019, 12:09 PM

## 2019-10-25 LAB — CULTURE, BLOOD (ROUTINE X 2)
Culture: NO GROWTH
Culture: NO GROWTH
Special Requests: ADEQUATE

## 2019-10-27 ENCOUNTER — Other Ambulatory Visit: Payer: Self-pay

## 2019-10-27 ENCOUNTER — Inpatient Hospital Stay (HOSPITAL_COMMUNITY)
Admission: EM | Admit: 2019-10-27 | Discharge: 2019-10-31 | DRG: 291 | Disposition: A | Discharge status: Home Health | Payer: Medicare Other | Attending: Internal Medicine | Admitting: Internal Medicine

## 2019-10-27 ENCOUNTER — Emergency Department (HOSPITAL_COMMUNITY): Payer: Medicare Other

## 2019-10-27 ENCOUNTER — Encounter (HOSPITAL_COMMUNITY): Payer: Self-pay

## 2019-10-27 DIAGNOSIS — N184 Chronic kidney disease, stage 4 (severe): Secondary | ICD-10-CM | POA: Diagnosis present

## 2019-10-27 DIAGNOSIS — E039 Hypothyroidism, unspecified: Secondary | ICD-10-CM | POA: Diagnosis present

## 2019-10-27 DIAGNOSIS — Z803 Family history of malignant neoplasm of breast: Secondary | ICD-10-CM

## 2019-10-27 DIAGNOSIS — E872 Acidosis: Secondary | ICD-10-CM | POA: Diagnosis present

## 2019-10-27 DIAGNOSIS — Z8249 Family history of ischemic heart disease and other diseases of the circulatory system: Secondary | ICD-10-CM | POA: Diagnosis not present

## 2019-10-27 DIAGNOSIS — J9601 Acute respiratory failure with hypoxia: Secondary | ICD-10-CM | POA: Diagnosis present

## 2019-10-27 DIAGNOSIS — Z8542 Personal history of malignant neoplasm of other parts of uterus: Secondary | ICD-10-CM | POA: Diagnosis not present

## 2019-10-27 DIAGNOSIS — E1122 Type 2 diabetes mellitus with diabetic chronic kidney disease: Secondary | ICD-10-CM | POA: Diagnosis present

## 2019-10-27 DIAGNOSIS — E890 Postprocedural hypothyroidism: Secondary | ICD-10-CM | POA: Diagnosis present

## 2019-10-27 DIAGNOSIS — Z20822 Contact with and (suspected) exposure to covid-19: Secondary | ICD-10-CM | POA: Diagnosis present

## 2019-10-27 DIAGNOSIS — Z923 Personal history of irradiation: Secondary | ICD-10-CM

## 2019-10-27 DIAGNOSIS — Z808 Family history of malignant neoplasm of other organs or systems: Secondary | ICD-10-CM

## 2019-10-27 DIAGNOSIS — E66813 Obesity, class 3: Secondary | ICD-10-CM | POA: Diagnosis present

## 2019-10-27 DIAGNOSIS — E662 Morbid (severe) obesity with alveolar hypoventilation: Secondary | ICD-10-CM | POA: Diagnosis present

## 2019-10-27 DIAGNOSIS — Z841 Family history of disorders of kidney and ureter: Secondary | ICD-10-CM | POA: Diagnosis not present

## 2019-10-27 DIAGNOSIS — Z800 Family history of malignant neoplasm of digestive organs: Secondary | ICD-10-CM | POA: Diagnosis not present

## 2019-10-27 DIAGNOSIS — E785 Hyperlipidemia, unspecified: Secondary | ICD-10-CM | POA: Diagnosis present

## 2019-10-27 DIAGNOSIS — Z79890 Hormone replacement therapy (postmenopausal): Secondary | ICD-10-CM | POA: Diagnosis not present

## 2019-10-27 DIAGNOSIS — I509 Heart failure, unspecified: Secondary | ICD-10-CM

## 2019-10-27 DIAGNOSIS — I10 Essential (primary) hypertension: Secondary | ICD-10-CM | POA: Diagnosis not present

## 2019-10-27 DIAGNOSIS — M810 Age-related osteoporosis without current pathological fracture: Secondary | ICD-10-CM | POA: Diagnosis present

## 2019-10-27 DIAGNOSIS — Z6841 Body Mass Index (BMI) 40.0 and over, adult: Secondary | ICD-10-CM

## 2019-10-27 DIAGNOSIS — Z91041 Radiographic dye allergy status: Secondary | ICD-10-CM

## 2019-10-27 DIAGNOSIS — Z23 Encounter for immunization: Secondary | ICD-10-CM

## 2019-10-27 DIAGNOSIS — I5043 Acute on chronic combined systolic (congestive) and diastolic (congestive) heart failure: Secondary | ICD-10-CM | POA: Diagnosis present

## 2019-10-27 DIAGNOSIS — L03115 Cellulitis of right lower limb: Secondary | ICD-10-CM | POA: Diagnosis present

## 2019-10-27 DIAGNOSIS — Z833 Family history of diabetes mellitus: Secondary | ICD-10-CM

## 2019-10-27 DIAGNOSIS — I272 Pulmonary hypertension, unspecified: Secondary | ICD-10-CM | POA: Diagnosis present

## 2019-10-27 DIAGNOSIS — M5136 Other intervertebral disc degeneration, lumbar region: Secondary | ICD-10-CM | POA: Diagnosis present

## 2019-10-27 DIAGNOSIS — S90821A Blister (nonthermal), right foot, initial encounter: Secondary | ICD-10-CM | POA: Diagnosis not present

## 2019-10-27 DIAGNOSIS — Z91048 Other nonmedicinal substance allergy status: Secondary | ICD-10-CM

## 2019-10-27 DIAGNOSIS — I5033 Acute on chronic diastolic (congestive) heart failure: Secondary | ICD-10-CM | POA: Diagnosis not present

## 2019-10-27 DIAGNOSIS — K5900 Constipation, unspecified: Secondary | ICD-10-CM | POA: Diagnosis not present

## 2019-10-27 DIAGNOSIS — I130 Hypertensive heart and chronic kidney disease with heart failure and stage 1 through stage 4 chronic kidney disease, or unspecified chronic kidney disease: Principal | ICD-10-CM | POA: Diagnosis present

## 2019-10-27 DIAGNOSIS — M7989 Other specified soft tissue disorders: Secondary | ICD-10-CM | POA: Diagnosis not present

## 2019-10-27 DIAGNOSIS — Z888 Allergy status to other drugs, medicaments and biological substances status: Secondary | ICD-10-CM

## 2019-10-27 DIAGNOSIS — J45909 Unspecified asthma, uncomplicated: Secondary | ICD-10-CM | POA: Diagnosis present

## 2019-10-27 DIAGNOSIS — N183 Chronic kidney disease, stage 3 unspecified: Secondary | ICD-10-CM | POA: Diagnosis present

## 2019-10-27 LAB — CBC WITH DIFFERENTIAL/PLATELET
Abs Immature Granulocytes: 0.03 10*3/uL (ref 0.00–0.07)
Basophils Absolute: 0 10*3/uL (ref 0.0–0.1)
Basophils Relative: 0 %
Eosinophils Absolute: 0.1 10*3/uL (ref 0.0–0.5)
Eosinophils Relative: 1 %
HCT: 46.6 % — ABNORMAL HIGH (ref 36.0–46.0)
Hemoglobin: 13.9 g/dL (ref 12.0–15.0)
Immature Granulocytes: 0 %
Lymphocytes Relative: 20 %
Lymphs Abs: 1.7 10*3/uL (ref 0.7–4.0)
MCH: 26.6 pg (ref 26.0–34.0)
MCHC: 29.8 g/dL — ABNORMAL LOW (ref 30.0–36.0)
MCV: 89.1 fL (ref 80.0–100.0)
Monocytes Absolute: 0.7 10*3/uL (ref 0.1–1.0)
Monocytes Relative: 8 %
Neutro Abs: 6.1 10*3/uL (ref 1.7–7.7)
Neutrophils Relative %: 71 %
Platelets: 283 10*3/uL (ref 150–400)
RBC: 5.23 MIL/uL — ABNORMAL HIGH (ref 3.87–5.11)
RDW: 14.6 % (ref 11.5–15.5)
WBC: 8.6 10*3/uL (ref 4.0–10.5)
nRBC: 0 % (ref 0.0–0.2)

## 2019-10-27 LAB — COMPREHENSIVE METABOLIC PANEL
ALT: 17 U/L (ref 0–44)
AST: 24 U/L (ref 15–41)
Albumin: 4.4 g/dL (ref 3.5–5.0)
Alkaline Phosphatase: 29 U/L — ABNORMAL LOW (ref 38–126)
Anion gap: 13 (ref 5–15)
BUN: 34 mg/dL — ABNORMAL HIGH (ref 8–23)
CO2: 25 mmol/L (ref 22–32)
Calcium: 9.2 mg/dL (ref 8.9–10.3)
Chloride: 105 mmol/L (ref 98–111)
Creatinine, Ser: 2.54 mg/dL — ABNORMAL HIGH (ref 0.44–1.00)
GFR calc Af Amer: 22 mL/min — ABNORMAL LOW (ref >60)
GFR calc non Af Amer: 19 mL/min — ABNORMAL LOW (ref >60)
Glucose, Bld: 123 mg/dL — ABNORMAL HIGH (ref 70–99)
Potassium: 4.2 mmol/L (ref 3.5–5.1)
Sodium: 143 mmol/L (ref 135–145)
Total Bilirubin: 0.9 mg/dL (ref 0.3–1.2)
Total Protein: 6.9 g/dL (ref 6.5–8.1)

## 2019-10-27 LAB — URINALYSIS, ROUTINE W REFLEX MICROSCOPIC
Bilirubin Urine: NEGATIVE
Glucose, UA: NEGATIVE mg/dL
Hgb urine dipstick: NEGATIVE
Ketones, ur: NEGATIVE mg/dL
Nitrite: NEGATIVE
Protein, ur: 100 mg/dL — AB
Specific Gravity, Urine: 1.013 (ref 1.005–1.030)
pH: 6 (ref 5.0–8.0)

## 2019-10-27 LAB — BRAIN NATRIURETIC PEPTIDE: B Natriuretic Peptide: 925.2 pg/mL — ABNORMAL HIGH (ref 0.0–100.0)

## 2019-10-27 LAB — HIV ANTIBODY (ROUTINE TESTING W REFLEX): HIV Screen 4th Generation wRfx: NONREACTIVE

## 2019-10-27 LAB — TROPONIN I (HIGH SENSITIVITY)
Troponin I (High Sensitivity): 50 ng/L — ABNORMAL HIGH (ref <18)
Troponin I (High Sensitivity): 46 ng/L — ABNORMAL HIGH (ref <18)

## 2019-10-27 LAB — SARS CORONAVIRUS 2 (TAT 6-24 HRS): SARS Coronavirus 2: NEGATIVE

## 2019-10-27 MED ORDER — ACETAMINOPHEN 500 MG PO TABS
1000.0000 mg | ORAL_TABLET | Freq: Four times a day (QID) | ORAL | Status: DC | PRN
  Administered 2019-10-27 – 2019-10-31 (×3): 1000 mg via ORAL
  Filled 2019-10-27 (×3): qty 2

## 2019-10-27 MED ORDER — LORATADINE 10 MG PO TABS
10.0000 mg | ORAL_TABLET | Freq: Every day | ORAL | Status: DC
  Administered 2019-10-28 – 2019-10-31 (×4): 10 mg via ORAL
  Filled 2019-10-27 (×4): qty 1

## 2019-10-27 MED ORDER — SODIUM CHLORIDE 0.9% FLUSH: 3.0000 mL | INTRAVENOUS | Status: DC | PRN

## 2019-10-27 MED ORDER — ONDANSETRON HCL 4 MG/2ML IJ SOLN
4.0000 mg | Freq: Four times a day (QID) | INTRAMUSCULAR | Status: DC | PRN
  Administered 2019-10-29: 4 mg via INTRAVENOUS
  Filled 2019-10-27: qty 2

## 2019-10-27 MED ORDER — POLYETHYLENE GLYCOL 3350 17 G PO PACK
17.0000 g | PACK | Freq: Every day | ORAL | Status: DC | PRN
  Administered 2019-10-29 (×2): 17 g via ORAL
  Filled 2019-10-27 (×2): qty 1

## 2019-10-27 MED ORDER — FENOFIBRATE 160 MG PO TABS
160.0000 mg | ORAL_TABLET | Freq: Every morning | ORAL | Status: DC
  Administered 2019-10-28 – 2019-10-31 (×4): 160 mg via ORAL
  Filled 2019-10-27 (×4): qty 1

## 2019-10-27 MED ORDER — METOPROLOL SUCCINATE ER 50 MG PO TB24
50.0000 mg | ORAL_TABLET | Freq: Every day | ORAL | Status: DC
  Administered 2019-10-27 – 2019-10-31 (×5): 50 mg via ORAL
  Filled 2019-10-27 (×5): qty 1

## 2019-10-27 MED ORDER — FUROSEMIDE 10 MG/ML IJ SOLN
40.0000 mg | Freq: Once | INTRAMUSCULAR | Status: AC
  Administered 2019-10-27: 16:00:00 40 mg via INTRAVENOUS
  Filled 2019-10-27: qty 4

## 2019-10-27 MED ORDER — INFLUENZA VAC SPLIT QUAD 0.5 ML IM SUSY
0.5000 mL | PREFILLED_SYRINGE | INTRAMUSCULAR | Status: AC
  Administered 2019-10-28: 14:00:00 0.5 mL via INTRAMUSCULAR
  Filled 2019-10-27: qty 0.5

## 2019-10-27 MED ORDER — SODIUM CHLORIDE 0.9 % IV SOLN: 250.0000 mL | INTRAVENOUS | Status: DC | PRN

## 2019-10-27 MED ORDER — FUROSEMIDE 10 MG/ML IJ SOLN
40.0000 mg | Freq: Two times a day (BID) | INTRAMUSCULAR | Status: DC
  Administered 2019-10-28: 40 mg via INTRAVENOUS
  Filled 2019-10-27: qty 4

## 2019-10-27 MED ORDER — SODIUM CHLORIDE 0.9% FLUSH
3.0000 mL | Freq: Two times a day (BID) | INTRAVENOUS | Status: DC
  Administered 2019-10-27 – 2019-10-31 (×6): 3 mL via INTRAVENOUS

## 2019-10-27 MED ORDER — SODIUM BICARBONATE 650 MG PO TABS
1300.0000 mg | ORAL_TABLET | Freq: Every day | ORAL | Status: DC
  Administered 2019-10-28 – 2019-10-31 (×4): 1300 mg via ORAL
  Filled 2019-10-27 (×4): qty 2

## 2019-10-27 MED ORDER — NITROGLYCERIN 2 % TD OINT
0.5000 [in_us] | TOPICAL_OINTMENT | Freq: Four times a day (QID) | TRANSDERMAL | Status: DC
  Administered 2019-10-27 – 2019-10-28 (×3): 0.5 [in_us] via TOPICAL
  Filled 2019-10-27: qty 30

## 2019-10-27 MED ORDER — HYDROCODONE-ACETAMINOPHEN 5-325 MG PO TABS
1.0000 | ORAL_TABLET | Freq: Once | ORAL | Status: AC
  Administered 2019-10-28: 1 via ORAL
  Filled 2019-10-27: qty 1

## 2019-10-27 MED ORDER — LEVOTHYROXINE SODIUM 50 MCG PO TABS
350.0000 ug | ORAL_TABLET | Freq: Every day | ORAL | Status: DC
  Administered 2019-10-28 – 2019-10-31 (×4): 350 ug via ORAL
  Filled 2019-10-27 (×4): qty 3

## 2019-10-27 MED ORDER — HEPARIN SODIUM (PORCINE) 5000 UNIT/ML IJ SOLN
5000.0000 [IU] | Freq: Three times a day (TID) | INTRAMUSCULAR | Status: DC
  Administered 2019-10-27 – 2019-10-31 (×11): 5000 [IU] via SUBCUTANEOUS
  Filled 2019-10-27 (×11): qty 1

## 2019-10-27 NOTE — H&P (Addendum)
History and Physical    Sheila Leach HKV:[id number] DOB: [birth year] DOA: 10/27/2019  PCP: Shon Baton, MD   Patient coming from: Home  I have personally briefly reviewed patient's old medical records in [facility]  Chief Complaint: Shortness of breath  HPI: Sheila Leach is a 64 y.o. female with medical history significant for  HTN, nephrolithiasis,  hyperlipidemia, hypothyroidism, obesity,  stage III chronic kidney disease who presented to the ED with a chief complaint of generalized swelling associated with shortness of breath which initially was with exertion now she is short of breath at rest.  She also has bilateral lower extremity swelling.  Patient was seen and evaluated 1 week ago for similar symptoms including dysuria. She was placed on antibiotics, finished the course this morning but continues to have dysuria.  States that she has gained about 50 pounds since the start of the year due to increased fluid in her bilateral lower extremities and abdomen.  She was seen and evaluated by PCP today and sent back to the ED for admission, IV diuretics, and monitoring of renal function.  States that she does not have a history of CHF and has never had this issue in the past.  She has not been on a diuretic before.  She was told that she had a fatty liver at her visit last week.  She reports history of kidney stones that have required stent and nephrostomy tube placement approximately 2 years ago. She denies having any chest pain, cough, fever, dizziness or lightheadedness, nausea, vomiting or abdominal pain   ED Course: 64 year old female with a past medical history of hypertension, hyperlipidemia, hypothyroidism and obesity presenting to the ED with a chief complaint of swelling.  Evaluated 1 week ago for similar symptoms.  She admits that the symptoms have been going on for the past month, noticed progressive worsening swelling in her lower extremities and abdomen.  She was seen and evaluated by  PCP today, completed her course of antibiotics for possible UTI but continues to be symptomatic.  She has never been told that she has a history of CHF and last echo done 2 years ago showed EF of 50 to 55%.  She was seen by PCP today for concerns for worsening CHF.  Lab work today shows creatinine of 2.5 which is similar to her baseline based on her CKD.  Urinalysis with with some leukocytes and bacteria.  Chest x-ray continues to show vascular congestion, pleural effusions.  Her BNP is elevated to the 900s.  Her Covid test is pending.  Her PCP strongly urged admission for this patient for IV diuresis due to her ongoing symptoms, repeat echo as well as monitoring her renal function.  I feel this is reasonable as she is symptomatic from this with significant weight gain and accumulation of fluid in her lower extremities.  Patient is comfortable with admission.  Will call hospitalist for admission.   Review of Systems: As per HPI otherwise 10 point review of systems negative.    Past Medical History:  Diagnosis Date  . Asthma    inhaler prn  . Bilateral ureteral calculi    10-21-2017 currently has retained right ureteral stent and left nephrostomy tube in place  . Cancer [facility])    uterine tumor  . Complication of anesthesia    trouble breathing after anesthesia didnt know had asthma at the time  . DDD (degenerative disc disease), lumbar    pt adenies at preop  . History of acute renal  failure 09/19/2017   hospital admission --- hydronephrosis ,  ureteral stones  . 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 History of kidney stones   . Hx of radiation therapy 6/6/, 6/21, 6/24,  02/07/2009   post operative vaginal cuff branii therapy  . Hyperlipidemia   . Hypertension    10-21-2017  per pt was on lisinopril prior to hospital admission 02/ 2019 she become hypotensive so lisinopril was discontinued  . Hypothyroidism, postsurgical FOLLOWED BY PCP   10-21-2017 per pt dx thyroid goiter at age 49 (age 1 s/p  partial thyroidectomy), age 66 thryoid greatly increased in size felt to be pre-cancerous (s/p  total thyroidectomy)  no radiation or chemo,  no recurrence  . Left sided sciatica    10-21-2017  per pt nerve injury during hysterectomy surgery 04/ 2010  ,  left leg weaker than right but gait issue  pt. denies  . Osteoporosis   . Personal history of colonic adenomas 01/24/2013  . Renal insufficiency    recent aucte renal failure due to ureteral obstructive secondary to kidney stones/ hyronephrosis 09-19-2017   . Seasonal allergies     Past Surgical History:  Procedure Laterality Date  . CESAREAN SECTION  1988  . CYSTOSCOPY W/ URETERAL STENT PLACEMENT Bilateral 09/19/2017   Procedure: CYSTOSCOPY WITH BILATERAL RETROGRADE PYELOGRAM/RIGHT URETERAL STENT PLACEMENT;  Surgeon: Cleon Gustin, MD;  Location: [facility];  Service: Urology;  Laterality: Bilateral;  . CYSTOSCOPY WITH RETROGRADE PYELOGRAM, URETEROSCOPY AND STENT PLACEMENT Bilateral 10/29/2017   Procedure: CYSTOSCOPY WITH RETROGRADE PYELOGRAM, URETEROSCOPY AND STENT PLACEMENT, REMOVAL LEFT NEPHROSTOMY TUBE;  Surgeon: Cleon Gustin, MD;  Location: [facility];  Service: Urology;  Laterality: Bilateral;  . DILATION AND CURETTAGE OF UTERUS  1996   w/ resectoscopic resection fibroids and Dx Laparoscopy  . HOLMIUM LASER APPLICATION Bilateral 1/82/9937   Procedure: HOLMIUM LASER APPLICATION;  Surgeon: Cleon Gustin, MD;  Location: [facility];  Service: Urology;  Laterality: Bilateral;  . IR NEPHROSTOMY PLACEMENT LEFT  09/20/2017  . PARATHYROIDECTOMY Left 05/12/2019   Procedure:  LEFT SUPERIOR PARATHYROIDECTOMY;  Surgeon: Armandina Gemma, MD;  Location: [facility];  Service: General;  Laterality: Left;  . THYROIDECTOMY, PARTIAL  age 73  . TONSILLECTOMY  age 41  . TOTAL ABDOMINAL HYSTERECTOMY W/ BILATERAL SALPINGOOPHORECTOMY  11-27-2008   dr Alycia Rossetti  [facility]   w/ Bilateral Pelvic Lymph Node Dissection's   . TOTAL THYROIDECTOMY  age 31   pre -cancerous  . TRANSTHORACIC ECHOCARDIOGRAM  09/21/2017   mild concentric LVH, ef 50-55%/  trivial MR and TR     reports that she has never smoked. She has never used smokeless tobacco. She reports that she does not drink alcohol or use drugs.  Allergies  Allergen Reactions  . Betadine [Povidone Iodine] Anaphylaxis, Shortness Of Breath and Swelling    Face and lips  . Contrast Media [Iodinated Diagnostic Agents] Anaphylaxis, Shortness Of Breath and Swelling    Face and lips  . Other Swelling    Butterscotch candy, causes swelling of lips  . Adhesive [Tape] Rash    Family History  Problem Relation Age of Onset  . Heart disease Mother   . Diabetes Mother   .  Kidney disease Mother   . Heart disease Father   . Thyroid cancer Sister   . Esophageal cancer Brother   . Diabetes Brother   . Liver disease Brother   . Breast cancer Other   . Colon cancer Neg Hx   . Rectal cancer Neg Hx   . Stomach cancer Neg Hx      Prior to Admission medications   Medication Sig Start Date End Date Taking? Authorizing Provider  acetaminophen (TYLENOL) 500 MG tablet Take 1,000 mg by mouth every 6 (six) hours as needed for moderate pain.   Yes [provider]  albuterol (PROVENTIL HFA;VENTOLIN HFA) 108 (90 BASE) MCG/ACT inhaler Inhale 2 puffs into the lungs every 6 (six) hours as needed for wheezing or shortness of breath.   Yes [provider]  cetirizine (ZYRTEC) 10 MG tablet Take 10 mg by mouth in the morning.    Yes [provider]  fenofibrate 160 MG tablet Take 160 mg by mouth every morning.  09/15/17  Yes [provider]  levothyroxine (SYNTHROID) 175 MCG tablet Take 350 mcg by mouth daily before breakfast.    Yes [provider]  Multiple Vitamins-Minerals (MULTIVITAMIN WITH MINERALS) tablet Take 1 tablet by mouth daily.   Yes [provider]  ondansetron (ZOFRAN ODT) 4 MG disintegrating tablet Take 1 tablet (4 mg total) by mouth every 8 (eight) hours as needed for nausea or vomiting. 10/20/19  Yes [place], Wylder S, PA  polyethylene glycol (MIRALAX / GLYCOLAX) packet Take 17 g by mouth daily as needed for moderate constipation. 09/26/17  Yes Oswald Hillock, MD  sodium bicarbonate 650 MG tablet Take 1,300 mg by mouth daily.   Yes [provider]  cephALEXin (KEFLEX) 500 MG capsule Take 1 capsule (500 mg total) by mouth 4 (four) times daily. Patient not taking: Reported on 10/27/2019 10/20/19   Tedd Sias, PA  HYDROcodone-acetaminophen (NORCO/VICODIN) 5-325 MG tablet Take 1 tablet by mouth every 4 (four) hours as needed for moderate pain. Patient not taking: Reported on 05/05/2019 10/29/17   Cleon Gustin, MD  traMADol (ULTRAM) 50 MG tablet Take 1-2 tablets (50-100 mg total) by mouth every 6 (six) hours as needed. Patient not taking: Reported on 10/27/2019 05/12/19   Armandina Gemma, MD    Physical Exam: Vitals:   10/27/19 1200 10/27/19 1300 10/27/19 1400 10/27/19 1500  BP: (!) 151/99 140/84 (!) 139/92 (!) 144/109  Pulse: (!) 106 (!) 104 (!) 106 (!) 103  Resp: (!) 26 (!) 28 (!) 22 (!) 21  Temp:      TempSrc:      SpO2: 100% 100% 99% 99%  Weight:      Height:         Vitals:   10/27/19 1200 10/27/19 1300 10/27/19 1400 10/27/19 1500  BP: (!) 151/99 140/84 (!) 139/92 (!) 144/109  Pulse: (!) 106 (!) 104 (!) 106 (!) 103  Resp: (!) 26 (!) 28 (!) 22 (!) 21  Temp:      TempSrc:      SpO2: 100% 100% 99% 99%  Weight:      Height:        Constitutional: NAD, alert and oriented x 3.  She has conversational dyspnea Eyes: PERRL, lids and conjunctivae normal ENMT:  Mucous membranes are moist.  Neck: normal, supple, no masses, no thyromegaly Respiratory: Rales at the bases, no wheezing, no crackles. Normal respiratory effort. No accessory muscle use.  Cardiovascular: Tachycardic, no murmurs / rubs /  gallops. 2+ extremity edema. 2+ pedal pulses. No carotid bruits.  Abdomen: no tenderness, no masses palpated. No hepatosplenomegaly. Bowel sounds positive.  Musculoskeletal: no clubbing / cyanosis. No joint deformity upper and lower extremities.  Skin: no rashes, lesions, ulcers, redness over dorsum of left foot, no differential warmth Neurologic: No gross focal neurologic deficit. Psychiatric: Normal mood and affect.   Labs on Admission: I have personally reviewed following labs and imaging studies  CBC: Recent Labs  Lab 10/27/19 1219  WBC 8.6  NEUTROABS 6.1  HGB 13.9  HCT 46.6*  MCV 89.1  PLT 093   Basic Metabolic Panel: Recent Labs  Lab 10/27/19 1219  NA 143  K 4.2  CL 105  CO2 25  GLUCOSE 123*  BUN 34*  CREATININE 2.54*  CALCIUM 9.2   GFR: Estimated Creatinine Clearance: 33.4 mL/min (A) (by C-G formula based on SCr of 2.54 mg/dL (H)). Liver Function Tests: Recent Labs  Lab 10/27/19 1219  AST 24  ALT 17  ALKPHOS 29*  BILITOT 0.9  PROT 6.9  ALBUMIN 4.4   No results for input(s): LIPASE, AMYLASE in the last 168 hours. No results for input(s): AMMONIA in the last 168 hours. Coagulation Profile: No results for input(s): INR, PROTIME in the last 168 hours. Cardiac Enzymes: No results for input(s): CKTOTAL, CKMB, CKMBINDEX, TROPONINI in the last 168 hours. BNP (last 3 results) No results for input(s): PROBNP in the last 8760 hours. HbA1C: No results for input(s): HGBA1C in the last 72 hours. CBG: No results for input(s): GLUCAP in the last 168 hours. Lipid Profile: No results for input(s): CHOL, HDL, LDLCALC, TRIG, CHOLHDL, LDLDIRECT in the last 72 hours. Thyroid Function Tests: No results for input(s): TSH, T4TOTAL,  FREET4, T3FREE, THYROIDAB in the last 72 hours. Anemia Panel: No results for input(s): VITAMINB12, FOLATE, FERRITIN, TIBC, IRON, RETICCTPCT in the last 72 hours. Urine analysis:    Component Value Date/Time   COLORURINE YELLOW 10/27/2019 1033   APPEARANCEUR CLEAR 10/27/2019 1033   LABSPEC 1.013 10/27/2019 1033   LABSPEC 1.020 09/25/2010 1606   PHURINE 6.0 10/27/2019 1033   GLUCOSEU NEGATIVE 10/27/2019 1033   HGBUR NEGATIVE 10/27/2019 1033   BILIRUBINUR NEGATIVE 10/27/2019 1033   BILIRUBINUR Negative 09/25/2010 1606   KETONESUR NEGATIVE 10/27/2019 1033   PROTEINUR 100 (A) 10/27/2019 1033   UROBILINOGEN 0.2 06/24/2014 0010   NITRITE NEGATIVE 10/27/2019 1033   LEUKOCYTESUR MODERATE (A) 10/27/2019 1033   LEUKOCYTESUR Small 09/25/2010 1606    Radiological Exams on Admission: DG Chest 2 View  Result Date: 10/27/2019 CLINICAL DATA:  Bilateral lower extremity edema, short of breath EXAM: CHEST - 2 VIEW COMPARISON:  10/20/2019 FINDINGS: Frontal and lateral views of the chest are obtained. The cardiac silhouette is enlarged but stable. There is a small left pleural effusion. Chronic central vascular congestion. No airspace disease or pneumothorax. No acute bony abnormalities. IMPRESSION: 1. Stable vascular congestion and left pleural effusion. Electronically Signed   By: Randa Ngo M.D.   On: 10/27/2019 11:25    EKG: Independently reviewed.  Sinus tachycardia.  Low voltage EKG  Assessment/Plan Principal Problem:   Acute on chronic diastolic CHF (congestive heart failure) (HCC) Active Problems:   HTN (hypertension)   Hypothyroidism   Type 2 diabetes mellitus with stage 3 chronic kidney disease (HCC)   Obesity, Class III, BMI 40-49.9 (morbid obesity) ([place])    Acute on chronic diastolic dysfunction CHF Patient presents for evaluation of shortness breath with exertion , lower extremity swelling and significant weight gain  BNP is elevated and chest x-ray shows stable vascular  congestion and left pleural effusion Will place patient on Lasix 40 mg IV every 12 Maintain low-sodium diet Obtain 2D echocardiogram to assess LVEF Optimize blood pressure control Obtain serial cardiac enzymes to rule out an acute coronary syndrome   Hypertension Start patient on metoprolol Add nitrates to optimize blood pressure control   Hypothyroidism Continue Synthroid   Stage III chronic kidney disease Secondary to hypertension Monitor renal function closely    Morbid obesity (BMI 18.4 kg/m2) Complicates overall prognosis and care  DVT prophylaxis: Heparin Code Status: Full Family Communication: Plan of care was discussed with patient in detail. She verbalizes understanding and agrees with the plan Disposition Plan: Back to previous home environment Consults called: Cardiology    Dareen Gutzwiller MD [facility]     10/27/2019, 3:55 PM

## 2019-10-27 NOTE — ED Provider Notes (Signed)
[place] DEPT Provider Note   CSN: [id number] Arrival date & time: 10/27/19  1017     History Chief Complaint  Patient presents with  . Leg Swelling  . Dysuria    Sheila Leach is a 64 y.o. female with a past medical history of HTN, nephrolithiasis, hypertension, hyperlipidemia, hypothyroidism, obesity, presenting to the ED with a chief complaint of swelling. Patient was seen and evaluated 1 week ago for similar symptoms including dysuria.  Was placed on antibiotics, finish the course this morning but continues to have dysuria.  States that she has gained about 30 pounds since the start of the year due to increased fluid in her bilateral lower extremities and abdomen.  She was seen evaluated by PCP today and sent back to the ED for admission, IV diuretics, and monitoring of renal function.  States that she does not have a history of CHF and has never had this issue in the past.  She has not been on a diuretic before.  She was told that she had a fatty liver at her visit last week.  She reports history of kidney stones that have required stent and nephrostomy tube placement approximately 2 years ago. She reports shortness of breath. Denies any chest pain, cough, fever, unilateral leg swelling, history of DVT or PE.  HPI     Past Medical History:  Diagnosis Date  . Asthma    inhaler prn  . Bilateral ureteral calculi    10-21-2017 currently has retained right ureteral stent and left nephrostomy tube in place  . Cancer [facility])    uterine tumor  . Complication of anesthesia    trouble breathing after anesthesia didnt know had asthma at the time  . DDD (degenerative disc disease), lumbar    pt adenies at preop  . History of acute renal failure 09/19/2017   hospital admission --- hydronephrosis ,  ureteral stones  . 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 History of kidney stones   . Hx of radiation therapy 6/6/, 6/21, 6/24, 02/07/2009   post operative vaginal cuff branii therapy  . Hyperlipidemia   . Hypertension    10-21-2017  per pt was on lisinopril prior to hospital admission 02/ 2019 she become hypotensive so lisinopril was discontinued  . Hypothyroidism, postsurgical FOLLOWED BY PCP   10-21-2017 per pt dx thyroid goiter at age 7 (age 67 s/p  partial thyroidectomy), age 59 thryoid greatly increased in size felt to be pre-cancerous (s/p  total thyroidectomy)  no radiation or chemo,  no recurrence  . Left sided sciatica    10-21-2017  per pt nerve injury during hysterectomy surgery 04/ 2010  ,  left leg weaker than right but gait issue  pt. denies  . Osteoporosis   . Personal history of colonic adenomas 01/24/2013  . Renal insufficiency    recent aucte renal failure due to ureteral obstructive secondary to kidney stones/ hyronephrosis 09-19-2017   . Seasonal allergies     Patient Active Problem List   Diagnosis Date Noted  . Hyperparathyroidism, primary ([place]) 05/07/2019  . AKI (acute kidney injury) ([place]) 09/19/2017  . HTN (hypertension) 09/19/2017  . Hydronephrosis with renal and ureteral calculus  obstruction 09/19/2017  . Hematuria 09/19/2017  . Acute lower UTI 09/19/2017  . UTI (urinary tract infection) 09/19/2017  . Endometrial cancer ([place])   . Personal history of colonic adenomas 01/24/2013  . Malignant neoplasm of corpus uteri, except isthmus ([place]) 03/02/2012    Past Surgical History:  Procedure Laterality Date  . CESAREAN SECTION  1988  . CYSTOSCOPY W/ URETERAL STENT PLACEMENT Bilateral 09/19/2017   Procedure: CYSTOSCOPY WITH BILATERAL RETROGRADE PYELOGRAM/RIGHT URETERAL STENT PLACEMENT;  Surgeon: Cleon Gustin, MD;  Location: [facility];  Service: Urology;  Laterality: Bilateral;  . CYSTOSCOPY WITH RETROGRADE PYELOGRAM, URETEROSCOPY AND STENT PLACEMENT Bilateral 10/29/2017   Procedure: CYSTOSCOPY WITH RETROGRADE PYELOGRAM, URETEROSCOPY AND STENT PLACEMENT, REMOVAL LEFT NEPHROSTOMY TUBE;  Surgeon: Cleon Gustin, MD;  Location: [facility];  Service: Urology;  Laterality: Bilateral;  . DILATION AND CURETTAGE OF UTERUS  1996   w/ resectoscopic resection fibroids and Dx Laparoscopy  . HOLMIUM LASER APPLICATION Bilateral 1/69/6789   Procedure: HOLMIUM LASER APPLICATION;  Surgeon: Cleon Gustin, MD;  Location: [facility];  Service: Urology;  Laterality: Bilateral;  . IR NEPHROSTOMY PLACEMENT LEFT  09/20/2017  . PARATHYROIDECTOMY Left 05/12/2019   Procedure: LEFT SUPERIOR PARATHYROIDECTOMY;  Surgeon: Armandina Gemma, MD;  Location: [facility];  Service: General;  Laterality: Left;  . THYROIDECTOMY, PARTIAL  age 79  . TONSILLECTOMY  age 14  . TOTAL ABDOMINAL HYSTERECTOMY W/ BILATERAL SALPINGOOPHORECTOMY  11-27-2008   dr Alycia Rossetti  [facility]   w/ Bilateral Pelvic Lymph Node Dissection's   . TOTAL THYROIDECTOMY  age 68   pre -cancerous  . TRANSTHORACIC ECHOCARDIOGRAM  09/21/2017   mild concentric LVH, ef 50-55%/  trivial MR and TR     OB History   No obstetric history on file.     Family History  Problem Relation Age of Onset  . Heart disease Mother   . Diabetes Mother   . Kidney disease Mother   . Heart disease Father   . Thyroid cancer Sister   . Esophageal cancer Brother   . Diabetes Brother   . Liver disease Brother   . Breast cancer Other   . Colon cancer Neg Hx   . Rectal cancer Neg Hx   . Stomach cancer Neg Hx     Social History   Tobacco Use  . Smoking status: Never Smoker  . Smokeless tobacco: Never Used  Substance Use Topics  . Alcohol use: No  . Drug use: No    Home Medications Prior to Admission medications   Medication Sig Start Date End  Date Taking? Authorizing Provider  acetaminophen (TYLENOL) 500 MG tablet Take 1,000 mg by mouth every 6 (six) hours as needed for moderate pain.   Yes [provider]  albuterol (PROVENTIL HFA;VENTOLIN HFA) 108 (90 BASE) MCG/ACT inhaler Inhale 2 puffs into the lungs every 6 (six) hours as needed for wheezing or shortness of breath.   Yes [provider]  cetirizine (ZYRTEC) 10 MG tablet Take 10 mg by mouth in the morning.    Yes [provider]  fenofibrate 160 MG tablet Take 160 mg by mouth every morning.  09/15/17  Yes [provider]  levothyroxine (SYNTHROID) 175 MCG tablet Take 350 mcg by mouth daily before breakfast.    Yes [provider]  Multiple Vitamins-Minerals (MULTIVITAMIN WITH MINERALS) tablet Take 1 tablet by mouth daily.   Yes [provider]  ondansetron (ZOFRAN ODT) 4 MG disintegrating tablet Take 1 tablet (4 mg total)  by mouth every 8 (eight) hours as needed for nausea or vomiting. 10/20/19  Yes Fondaw, Wylder S, PA  polyethylene glycol (MIRALAX / GLYCOLAX) packet Take 17 g by mouth daily as needed for moderate constipation. 09/26/17  Yes Oswald Hillock, MD  sodium bicarbonate 650 MG tablet Take 1,300 mg by mouth daily.   Yes [provider]  cephALEXin (KEFLEX) 500 MG capsule Take 1 capsule (500 mg total) by mouth 4 (four) times daily. Patient not taking: Reported on 10/27/2019 10/20/19   Tedd Sias, PA  HYDROcodone-acetaminophen (NORCO/VICODIN) 5-325 MG tablet Take 1 tablet by mouth every 4 (four) hours as needed for moderate pain. Patient not taking: Reported on 05/05/2019 10/29/17   Cleon Gustin, MD  traMADol (ULTRAM) 50 MG tablet Take 1-2 tablets (50-100 mg total) by mouth every 6 (six) hours as needed. Patient not taking: Reported on 10/20/2019 05/12/19   Armandina Gemma, MD    Allergies    Betadine [povidone iodine], Contrast media [iodinated diagnostic agents], Other, and Adhesive [tape]  Review of Systems    Review of Systems  Constitutional: Negative for appetite change, chills and fever.  HENT: Negative for ear pain, rhinorrhea, sneezing and sore throat.   Eyes: Negative for photophobia and visual disturbance.  Respiratory: Positive for shortness of breath. Negative for cough, chest tightness and wheezing.   Cardiovascular: Positive for leg swelling. Negative for chest pain and palpitations.  Gastrointestinal: Positive for abdominal distention. Negative for abdominal pain, blood in stool, constipation, diarrhea, nausea and vomiting.  Genitourinary: Positive for dysuria. Negative for hematuria and urgency.  Musculoskeletal: Negative for myalgias.  Skin: Negative for rash.  Neurological: Negative for dizziness, weakness and light-headedness.    Physical Exam Updated Vital Signs BP (!) 139/92   Pulse (!) 106   Temp 98.2 F (36.8 C) (Oral)   Resp (!) 22   Ht 5\' 7"  (1.702 m)   Wt (!) 144.2 kg   SpO2 99%   BMI 49.81 kg/m   Physical Exam Vitals and nursing note reviewed.  Constitutional:      General: She is not in acute distress.    Appearance: She is well-developed.  HENT:     Head: Normocephalic and atraumatic.     Nose: Nose normal.  Eyes:     General: No scleral icterus.       Left eye: No discharge.     Conjunctiva/sclera: Conjunctivae normal.  Cardiovascular:     Rate and Rhythm: Regular rhythm. Tachycardia present.     Heart sounds: Normal heart sounds. No murmur. No friction rub. No gallop.   Pulmonary:     Effort: Pulmonary effort is normal. No respiratory distress.     Breath sounds: Normal breath sounds.  Abdominal:     General: Bowel sounds are normal. There is distension.     Palpations: Abdomen is soft.     Tenderness: There is no abdominal tenderness. There is no guarding.  Musculoskeletal:        General: Normal range of motion.     Cervical back: Normal range of motion and neck supple.     Right lower leg: Edema present.     Left lower leg: Edema  present.     Comments: 2+ pitting edema in bilateral lower extremities.  Skin:    General: Skin is warm and dry.     Findings: No rash.  Neurological:     Mental Status: She is alert.     Motor: No abnormal muscle tone.  Coordination: Coordination normal.     ED Results / Procedures / Treatments   Labs (all labs ordered are listed, but only abnormal results are displayed) Labs Reviewed  URINALYSIS, ROUTINE W REFLEX MICROSCOPIC - Abnormal; Notable for the following components:      Result Value   Protein, ur 100 (*)    Leukocytes,Ua MODERATE (*)    Bacteria, UA RARE (*)    All other components within normal limits  COMPREHENSIVE METABOLIC PANEL - Abnormal; Notable for the following components:   Glucose, Bld 123 (*)    BUN 34 (*)    Creatinine, Ser 2.54 (*)    Alkaline Phosphatase 29 (*)    GFR calc non Af Amer 19 (*)    GFR calc Af Amer 22 (*)    All other components within normal limits  CBC WITH DIFFERENTIAL/PLATELET - Abnormal; Notable for the following components:   RBC 5.23 (*)    HCT 46.6 (*)    MCHC 29.8 (*)    All other components within normal limits  BRAIN NATRIURETIC PEPTIDE - Abnormal; Notable for the following components:   B Natriuretic Peptide 925.2 (*)    All other components within normal limits  URINE CULTURE  SARS CORONAVIRUS 2 (TAT 6-24 HRS)    EKG None  Radiology DG Chest 2 View  Result Date: 10/27/2019 CLINICAL DATA:  Bilateral lower extremity edema, short of breath EXAM: CHEST - 2 VIEW COMPARISON:  10/20/2019 FINDINGS: Frontal and lateral views of the chest are obtained. The cardiac silhouette is enlarged but stable. There is a small left pleural effusion. Chronic central vascular congestion. No airspace disease or pneumothorax. No acute bony abnormalities. IMPRESSION: 1. Stable vascular congestion and left pleural effusion. Electronically Signed   By: Randa Ngo M.D.   On: 10/27/2019 11:25    Procedures Procedures (including critical  care time)  Medications Ordered in ED Medications  furosemide (LASIX) injection 40 mg (has no administration in time range)    ED Course  I have reviewed the triage vital signs and the nursing notes.  Pertinent labs & imaging results that were available during my care of the patient were reviewed by me and considered in my medical decision making (see chart for details).    MDM Rules/Calculators/A&P                      64 year old female with a past medical history of hypertension, hyperlipidemia, hypothyroidism and obesity presenting to the ED with a chief complaint of swelling.  Evaluated 1 week ago for similar symptoms.  She admits that the symptoms have been going on for the past month, noticed progressive worsening swelling in her lower extremities and abdomen.  She was seen and evaluated by PCP today, completed her course of antibiotics for possible UTI but continues to be symptomatic.  She has never been told that she has a history of CHF and last echo done 2 years ago showed EF of 50 to 55%.  She was seen by PCP today for concerns for worsening CHF.  Lab work today shows creatinine of 2.5 which is similar to her baseline based on her CKD.  Urinalysis with with some leukocytes and bacteria.  Chest x-ray continues to show vascular congestion, pleural effusions.  Her BNP is elevated to the 900s.  Her Covid test is pending.  Her PCP messaged me and strongly urged admission for this patient for IV diuresis due to her ongoing symptoms, repeat echo as well as  monitoring her renal function.  I feel this is reasonable as she is symptomatic from this with significant weight gain and accumulation of fluid in her lower extremities.  Patient is comfortable with admission.  Will call hospitalist for admission.  Final Clinical Impression(s) / ED Diagnoses Final diagnoses:  Acute congestive heart failure, unspecified heart failure type ([place])    Rx / DC Orders ED Discharge Orders    None     I  personally viewed and interpreted all imaging and labwork at today's visit, as well as reviewing PCP notes.  Portions of this note were generated with [employer]. Dictation errors may occur despite best attempts at proofreading.    Delia Heady, PA-C 10/27/19 1449    Sherwood Gambler, MD 10/31/19 [phone]

## 2019-10-27 NOTE — ED Notes (Signed)
Tried to draw blood but was unsuccessful

## 2019-10-27 NOTE — ED Triage Notes (Signed)
Patient states she was given antibiotics a week ago for a UTI and states she is still having dysuria when she urinates.   Patient  also c/o abdominal and bilateral leg swelling. patient went to her PCP today and was told she needed IV diuretics.

## 2019-10-28 ENCOUNTER — Inpatient Hospital Stay (HOSPITAL_COMMUNITY): Payer: Medicare Other

## 2019-10-28 DIAGNOSIS — I5033 Acute on chronic diastolic (congestive) heart failure: Secondary | ICD-10-CM

## 2019-10-28 LAB — ECHOCARDIOGRAM COMPLETE
Height: 67 in
Weight: 5088 oz

## 2019-10-28 LAB — BASIC METABOLIC PANEL
Anion gap: 16 — ABNORMAL HIGH (ref 5–15)
BUN: 32 mg/dL — ABNORMAL HIGH (ref 8–23)
CO2: 22 mmol/L (ref 22–32)
Calcium: 9.6 mg/dL (ref 8.9–10.3)
Chloride: 106 mmol/L (ref 98–111)
Creatinine, Ser: 2.6 mg/dL — ABNORMAL HIGH (ref 0.44–1.00)
GFR calc Af Amer: 22 mL/min — ABNORMAL LOW (ref >60)
GFR calc non Af Amer: 19 mL/min — ABNORMAL LOW (ref >60)
Glucose, Bld: 122 mg/dL — ABNORMAL HIGH (ref 70–99)
Potassium: 4.6 mmol/L (ref 3.5–5.1)
Sodium: 144 mmol/L (ref 135–145)

## 2019-10-28 LAB — URINE CULTURE: Culture: NO GROWTH

## 2019-10-28 MED ORDER — HYDRALAZINE HCL 25 MG PO TABS
25.0000 mg | ORAL_TABLET | Freq: Three times a day (TID) | ORAL | Status: DC
  Administered 2019-10-28 – 2019-10-31 (×10): 25 mg via ORAL
  Filled 2019-10-28 (×10): qty 1

## 2019-10-28 MED ORDER — ISOSORBIDE MONONITRATE ER 30 MG PO TB24
30.0000 mg | ORAL_TABLET | Freq: Every day | ORAL | Status: DC
  Administered 2019-10-28 – 2019-10-31 (×4): 30 mg via ORAL
  Filled 2019-10-28 (×4): qty 1

## 2019-10-28 MED ORDER — FUROSEMIDE 10 MG/ML IJ SOLN
40.0000 mg | Freq: Once | INTRAMUSCULAR | Status: DC
  Filled 2019-10-28 (×2): qty 4

## 2019-10-28 MED ORDER — FUROSEMIDE 10 MG/ML IJ SOLN
5.0000 mg/h | INTRAVENOUS | Status: DC
  Administered 2019-10-28 – 2019-10-31 (×3): 5 mg/h via INTRAVENOUS
  Filled 2019-10-28: qty 20
  Filled 2019-10-28 (×2): qty 25

## 2019-10-28 NOTE — Consult Note (Signed)
Cardiology Consultation:   Patient ID: Sheila Leach MRN: [id number]; DOB: February 12, 1956  Admit date: 10/27/2019 Date of Consult: 10/28/2019  Primary Care Provider: Shon Baton, MD Primary Cardiologist:  New, Alandria Butkiewicz  Primary Electrophysiologist:  None    Patient Profile:   Sheila Leach is a 64 y.o. female with a hx of hypertension,, hyperlipidemia, obesity, CKD who is being seen today for the evaluation of CHF  at the request of  Dr. Pietro Cassis. .  History of Present Illness:   [name]  Is a 64 yo with hx of HTN, morbid obesity, hypothyroidism    She has had progressive lower ext. Edema .      Primary MD is Dr. Virgina Jock.  2 month hx of progressive shortness of breath and swelling.  She does not eat much salt according to her.  She has never had a history of hypertension at.  She does have a family history of coronary artery disease.  She does not describe any chest pain herself.  She does not have any fever, cough + PND and orthopnea  She was sent to the ER last week for CHF .   TSH was found to be 24.   Found also to have another kidney stone.  Has gained 30 lbs over the past several months   Echo from Feb. 2019 revealed normal left ventricular systolic function with an ejection fraction of 55%.  She has trivial mitral regurgitation.  Pulmonary artery pressures were estimated at 33 mmHg.  She does not get any exercise.    Past Medical History:  Diagnosis Date  . Asthma    inhaler prn  . Bilateral ureteral calculi    10-21-2017 currently has retained right ureteral stent and left nephrostomy tube in place  . Cancer [facility])    uterine tumor  . Complication of anesthesia    trouble breathing after anesthesia didnt know had asthma at the time  . DDD (degenerative disc disease), lumbar    pt adenies at preop  . History of acute renal failure 09/19/2017   hospital admission --- hydronephrosis ,  ureteral stones  . 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 History of kidney stones   . Hx of radiation therapy 6/6/, 6/21, 6/24, 02/07/2009   post operative vaginal cuff branii therapy  . Hyperlipidemia   . Hypertension    10-21-2017  per pt was on lisinopril prior to hospital admission 02/ 2019 she become hypotensive so lisinopril was discontinued  . Hypothyroidism, postsurgical FOLLOWED BY PCP   10-21-2017 per pt dx thyroid goiter at age 66 (age 23 s/p  partial thyroidectomy), age 27 thryoid greatly increased in size felt to be pre-cancerous (s/p  total thyroidectomy)  no radiation or chemo,  no recurrence  . Left sided sciatica    10-21-2017  per pt nerve injury during hysterectomy surgery 04/ 2010  ,  left leg weaker than right but gait issue  pt. denies  . Osteoporosis   . Personal history of colonic adenomas 01/24/2013  . Renal insufficiency    recent aucte renal failure due to ureteral obstructive secondary to kidney stones/ hyronephrosis 09-19-2017   . Seasonal allergies     Past Surgical History:  Procedure Laterality Date  . CESAREAN SECTION  1988  . CYSTOSCOPY W/ URETERAL STENT PLACEMENT Bilateral 09/19/2017   Procedure: CYSTOSCOPY WITH BILATERAL RETROGRADE PYELOGRAM/RIGHT URETERAL STENT PLACEMENT;  Surgeon: Cleon Gustin, MD;  Location: [facility];  Service: Urology;  Laterality: Bilateral;  . CYSTOSCOPY WITH RETROGRADE PYELOGRAM, URETEROSCOPY AND STENT PLACEMENT Bilateral 10/29/2017   Procedure: CYSTOSCOPY WITH RETROGRADE PYELOGRAM, URETEROSCOPY AND STENT PLACEMENT, REMOVAL LEFT NEPHROSTOMY TUBE;  Surgeon: Cleon Gustin, MD;  Location: [facility];  Service: Urology;  Laterality: Bilateral;  . DILATION AND CURETTAGE OF UTERUS  1996    w/ resectoscopic resection fibroids and Dx Laparoscopy  . HOLMIUM LASER APPLICATION Bilateral 2/59/5638   Procedure: HOLMIUM LASER APPLICATION;  Surgeon: Cleon Gustin, MD;  Location: [facility];  Service: Urology;  Laterality: Bilateral;  . IR NEPHROSTOMY PLACEMENT LEFT  09/20/2017  . PARATHYROIDECTOMY Left 05/12/2019   Procedure: LEFT SUPERIOR PARATHYROIDECTOMY;  Surgeon: Armandina Gemma, MD;  Location: [facility];  Service: General;  Laterality: Left;  . THYROIDECTOMY, PARTIAL  age 2  . TONSILLECTOMY  age 29  . TOTAL ABDOMINAL HYSTERECTOMY W/ BILATERAL SALPINGOOPHORECTOMY  11-27-2008   dr Alycia Rossetti  [facility]   w/ Bilateral Pelvic Lymph Node Dissection's   . TOTAL THYROIDECTOMY  age 38   pre -cancerous  . TRANSTHORACIC ECHOCARDIOGRAM  09/21/2017   mild concentric LVH, ef 50-55%/  trivial MR and TR     Home Medications:  Prior to Admission medications   Medication Sig Start Date End Date Taking? Authorizing Provider  acetaminophen (TYLENOL) 500 MG tablet Take 1,000 mg by mouth every 6 (six) hours as needed for moderate pain.   Yes [provider]  albuterol (PROVENTIL HFA;VENTOLIN HFA) 108 (90 BASE) MCG/ACT inhaler Inhale 2 puffs into the lungs every 6 (six) hours as needed for wheezing or shortness of breath.   Yes [provider]  cetirizine (ZYRTEC) 10 MG tablet Take 10 mg by mouth in the morning.    Yes [provider]  fenofibrate 160 MG tablet Take 160 mg by mouth every morning.  09/15/17  Yes [provider]  levothyroxine (SYNTHROID) 175 MCG tablet Take 350 mcg by mouth daily before breakfast.    Yes [provider]  Multiple Vitamins-Minerals (MULTIVITAMIN WITH MINERALS) tablet Take 1 tablet by mouth daily.   Yes [provider]  ondansetron (ZOFRAN ODT) 4 MG disintegrating tablet Take 1 tablet (4 mg total) by mouth every 8 (eight) hours as needed for nausea or vomiting. 10/20/19  Yes Fondaw, Wylder S, PA  polyethylene  glycol (MIRALAX / GLYCOLAX) packet Take 17 g by mouth daily as needed for moderate constipation. 09/26/17  Yes Oswald Hillock, MD  sodium bicarbonate 650 MG tablet Take 1,300 mg by mouth daily.   Yes [provider]  cephALEXin (KEFLEX) 500 MG capsule Take 1 capsule (500 mg total) by mouth 4 (four) times daily. Patient not taking: Reported on 10/27/2019 10/20/19   Tedd Sias, PA  HYDROcodone-acetaminophen (NORCO/VICODIN) 5-325 MG tablet Take 1 tablet by mouth every 4 (four) hours as needed for moderate pain. Patient not taking: Reported on 05/05/2019 10/29/17   Cleon Gustin, MD  traMADol (ULTRAM) 50 MG tablet Take 1-2 tablets (50-100 mg total) by mouth every 6 (six) hours as needed. Patient not taking: Reported on 10/27/2019 05/12/19   Armandina Gemma, MD    Inpatient Medications: Scheduled Meds: . fenofibrate  160 mg Oral q morning - 10a  . furosemide  40 mg Intravenous  Once  . heparin  5,000 Units Subcutaneous Q8H  . influenza vac split quadrivalent PF  0.5 mL Intramuscular Tomorrow-1000  . levothyroxine  350 mcg Oral Q0600  . loratadine  10 mg Oral Daily  . metoprolol succinate  50 mg Oral Daily  . nitroGLYCERIN  0.5 inch Topical Q6H  . sodium bicarbonate  1,300 mg Oral Daily  . sodium chloride flush  3 mL Intravenous Q12H   Continuous Infusions: . sodium chloride    . furosemide (LASIX) infusion     PRN Meds: sodium chloride, acetaminophen, ondansetron (ZOFRAN) IV, polyethylene glycol, sodium chloride flush  Allergies:    Allergies  Allergen Reactions  . Betadine [Povidone Iodine] Anaphylaxis, Shortness Of Breath and Swelling    Face and lips  . Contrast Media [Iodinated Diagnostic Agents] Anaphylaxis, Shortness Of Breath and Swelling    Face and lips  . Other Swelling    Butterscotch candy, causes swelling of lips  . Adhesive [Tape] Rash    Social History:   Social History   Socioeconomic History  . Marital status: Divorced    Spouse name: Not on file    . Number of children: Not on file  . Years of education: Not on file  . Highest education level: Not on file  Occupational History  . Not on file  Tobacco Use  . Smoking status: Never Smoker  . Smokeless tobacco: Never Used  Substance and Sexual Activity  . Alcohol use: No  . Drug use: No  . Sexual activity: Not Currently  Other Topics Concern  . Not on file  Social History Narrative  . Not on file   Social Determinants of Health   Financial Resource Strain:   . Difficulty of Paying Living Expenses:   Food Insecurity:   . Worried About Charity fundraiser in the Last Year:   . Arboriculturist in the Last Year:   Transportation Needs:   . Film/video editor (Medical):   Marland Kitchen Lack of Transportation (Non-Medical):   Physical Activity:   . Days of Exercise per Week:   . Minutes of Exercise per Session:   Stress:   . Feeling of Stress :   Social Connections:   . Frequency of Communication with Friends and Family:   . Frequency of Social Gatherings with Friends and Family:   . Attends Religious Services:   . Active Member of Clubs or Organizations:   . Attends Archivist Meetings:   Marland Kitchen Marital Status:   Intimate Partner Violence:   . Fear of Current or Ex-Partner:   . Emotionally Abused:   Marland Kitchen Physically Abused:   . Sexually Abused:     Family History:    Family History  Problem Relation Age of Onset  . Heart disease Mother   . Diabetes Mother   . Kidney disease Mother   . Heart disease Father   . Thyroid cancer Sister   . Esophageal cancer Brother   . Diabetes Brother   . Liver disease Brother   . Breast cancer Other   . Colon cancer Neg Hx   . Rectal cancer Neg Hx   . Stomach cancer Neg Hx      ROS:  Please see the history of present illness.   All other ROS reviewed and negative.     Physical Exam/Data:   Vitals:   10/27/19 1907 10/27/19 2220 10/28/19 0044 10/28/19 0500  BP: (!) 163/109 (!) 136/93 (!) 144/97   Pulse: 96 100  95   Resp:  20 19 18  (!) 24  Temp: 97.8 F (36.6 C) 98.7 F (37.1 C) 98.6 F (37 C)   TempSrc: Oral Oral Oral   SpO2: 98% 100% 97%   Weight:      Height:        Intake/Output Summary (Last 24 hours) at 10/28/2019 0831 Last data filed at 10/27/2019 2000 Gross per 24 hour  Intake 120 ml  Output --  Net 120 ml   Last 3 Weights 10/27/2019 10/20/2019 05/12/2019  Weight (lbs) 318 lb 299 lb 275 lb  Weight (kg) 144.244 kg 135.626 kg 124.739 kg     Body mass index is 49.81 kg/m.  General:  Morbidly obese female,  Sitting up in chair  HEENT: normal Lymph: no adenopathy Neck: no JVD Endocrine:  No thryomegaly Vascular: No carotid bruits; FA pulses 2+ bilaterally without bruits  Cardiac:  normal S1, S2; RRR; no murmur  Lungs:  clear to auscultation bilaterally, no wheezing, rhonchi or rales  Abd: soft, nontender, no hepatomegaly  Ext: no edema Musculoskeletal:  No deformities, BUE and BLE strength normal and equal Skin: warm and dry  Neuro:  CNs 2-12 intact, no focal abnormalities noted Psych:  Normal affect   EKG:     Sinus tachycardia 110 beats a minute.  Low-grade tachycardia. Telemetry:  Sinus tach   Relevant CV Studies:   Laboratory Data:  High Sensitivity Troponin:   Recent Labs  Lab 10/27/19 1921 10/27/19 2108  TROPONINIHS 50* 46*     Chemistry Recent Labs  Lab 10/27/19 1219 10/28/19 0600  NA 143 144  K 4.2 4.6  CL 105 106  CO2 25 22  GLUCOSE 123* 122*  BUN 34* 32*  CREATININE 2.54* 2.60*  CALCIUM 9.2 9.6  GFRNONAA 19* 19*  GFRAA 22* 22*  ANIONGAP 13 16*    Recent Labs  Lab 10/27/19 1219  PROT 6.9  ALBUMIN 4.4  AST 24  ALT 17  ALKPHOS 29*  BILITOT 0.9   Hematology Recent Labs  Lab 10/27/19 1219  WBC 8.6  RBC 5.23*  HGB 13.9  HCT 46.6*  MCV 89.1  MCH 26.6  MCHC 29.8*  RDW 14.6  PLT 283   BNP Recent Labs  Lab 10/27/19 1255  BNP 925.2*    DDimer No results for input(s): DDIMER in the last 168 hours.   Radiology/Studies:  DG Chest 2  View  Result Date: 10/27/2019 CLINICAL DATA:  Bilateral lower extremity edema, short of breath EXAM: CHEST - 2 VIEW COMPARISON:  10/20/2019 FINDINGS: Frontal and lateral views of the chest are obtained. The cardiac silhouette is enlarged but stable. There is a small left pleural effusion. Chronic central vascular congestion. No airspace disease or pneumothorax. No acute bony abnormalities. IMPRESSION: 1. Stable vascular congestion and left pleural effusion. Electronically Signed   By: Randa Ngo M.D.   On: 10/27/2019 11:25      Assessment and Plan:   1.   Acute CHF:   Presumed diastolic CHF .  We will get an echocardiogram for further assessment.  LV function.  She had normal left ventricular systolic function by echo in 2019.  She has an allergy to IV contrast and develops angioedema with contrast.      IV lasix drip has been ordered.  Will start hydralazine and add Imdur  ( DC NTG paste)  2.  HTN:   Cont lasix ,  toprol XL ,   Have added hydralazine and Imdur   3.  Morbid obesity :  Will cont to encourage diet , exercise, weight loss as OP   4.  Hypothyroidism:   Synthroid has been increased   5.  CKD:  Further plans per IM team .        For questions or updates, please contact [place] Please consult [email or web address] for contact info under     Signed, Mertie Moores, MD  10/28/2019 8:31 AM

## 2019-10-28 NOTE — Progress Notes (Signed)
RN notified by tele of 6beat run of vtach vs noted/stable Pt asymptomatic reported to on call.

## 2019-10-28 NOTE — Progress Notes (Signed)
  Echocardiogram 2D Echocardiogram has been performed.  Sheila Leach 10/28/2019, 10:17 AM

## 2019-10-28 NOTE — Progress Notes (Signed)
PROGRESS NOTE  Sheila Leach  DOB: 1955-10-24  PCP: Shon Baton, MD VWP:[id number]  DOA: 10/27/2019 Admitted From: Home  LOS: 1 day   Chief Complaint  Patient presents with  . Leg Swelling  . Dysuria   Brief narrative: Sheila Leach  Is a 64 yo with hx of morbid obesity, HTN, HLD, CKD 3, hypothyroidism, nephrolithiasis HTN, morbid obesity, hypothyroidism. Patient presented to the ED on 3/26 with complaint of progressively worsening leg swelling associated with shortness of breath, now even at rest.  Started in January and has been progressive. >40 lbs weight gain 3 months. 3/19, patient had come to the ED with dyspnea. She was found to have lactic acidosis to 2.5 but no acute source of infection.  She was discharged on oral antibiotic and asked to follow-up with PCP.  Patient completed the course of oral antibiotic but did not feel better.  He went to her PCPs office on 3/26 and was subsequently sent to the ED for IV diuresis.   In the ED, patient was afebrile, tachycardic, blood pressure elevated to 170s.  Blood work showed creatinine elevated to 2.54, which is her baseline. Urinalysis with with some leukocytes and bacteria.  Chest x-ray showed vascular congestion, pleural effusions.  Her BNP was elevated to the 900s.  Patient was admitted to hospitalist service for further evaluation management  Subjective: Patient was seen and examined this morning.  Morbidly obese Caucasian female.  Sitting up in chair.  Not in pain.  On low-flow oxygen.  Assessment/Plan: Acute on chronic diastolic dysfunction CHF -Patient presents for evaluation of shortness breath with exertion , lower extremity swelling and significant weight gain. -BNP was elevated and chest x-ray showed stable vascular congestion and left pleural effusion -Patient was started on IV Lasix.  I have switched her to Lasix drip this morning.  -Monitor intake output, electrolytes, renal function, blood pressure. -Low-sodium  diet -Repeat echocardiogram ordered. -Cardiology consult appreciated.  Hydralazine and Imdur started.  Elevated troponin -Troponin mildly elevated likely because of congestive heart failure.  Accelerated hypertension -Blood pressure elevated up to 170s.  Continue metoprolol.  Hydralazine and Imdur added by cardiology.    CKD stage III -Creatinine 2.6 today.  Remains at baseline.  Monitoring on Lasix drip.  Hypothyroidism Continue Synthroid  History of bilateral nephrolithiasis -reports history of kidney stones that have required stent and nephrostomy tube placement approximately 2 years ago. -3/19, CT abdomen showed severe left renal atrophy with severe hydronephrosis due to a 4 mm calculus at left UPJ.  This finding was present in previous CT scan obtained on February 2019 as well. -Patient states she follows up with the urologist at [facility] urology.  Morbid obesity -Body mass index is 49.18 kg/m. Patient has been advised to make an attempt to improve diet and exercise patterns to aid in weight loss.  Code Status:  Full code DVT prophylaxis:  Heparin subcu Antimicrobials:  None Fluid: None Diet: Low-sodium diet Mobility: Encourage ambulation Family Communication:  Not at bedside Discharge plan:  Anticipated date and disposition: Anticipate 2-3 more days of hospital stay Barriers: Needs further IV diuresis  Consultants:  Cardiology  Antimicrobials: Anti-infectives (From admission, onward)   None        Code Status: Full Code   Diet Order            Diet 2 gram sodium Room service appropriate? Yes; Fluid consistency: Thin  Diet effective now              Infusions:  .  sodium chloride    . furosemide (LASIX) infusion 5 mg/hr (10/28/19 0910)    Scheduled Meds: . fenofibrate  160 mg Oral q morning - 10a  . furosemide  40 mg Intravenous Once  . heparin  5,000 Units Subcutaneous Q8H  . hydrALAZINE  25 mg Oral Q8H  . influenza vac split quadrivalent PF   0.5 mL Intramuscular Tomorrow-1000  . isosorbide mononitrate  30 mg Oral Daily  . levothyroxine  350 mcg Oral Q0600  . loratadine  10 mg Oral Daily  . metoprolol succinate  50 mg Oral Daily  . sodium bicarbonate  1,300 mg Oral Daily  . sodium chloride flush  3 mL Intravenous Q12H    PRN meds: sodium chloride, acetaminophen, ondansetron (ZOFRAN) IV, polyethylene glycol, sodium chloride flush   Objective: Vitals:   10/28/19 0500 10/28/19 0924  BP:  (!) 146/99  Pulse:  97  Resp: (!) 24 18  Temp:  97.9 F (36.6 C)  SpO2:  98%    Intake/Output Summary (Last 24 hours) at 10/28/2019 1340 Last data filed at 10/28/2019 0909 Gross per 24 hour  Intake 123 ml  Output --  Net 123 ml   Filed Weights   10/27/19 1028  Weight: (!) 144.2 kg   Weight change:  Body mass index is 49.81 kg/m.   Physical Exam: General exam: Appears calm and comfortable.  Morbidly obese female Skin: No rashes, lesions or ulcers. HEENT: Atraumatic, normocephalic, supple neck, no obvious bleeding Lungs: Clear to auscultation bilaterally CVS: Regular rate and rhythm, no murmur GI/Abd soft, distended from obesity, nontender, bowel sound present CNS: Alert, awake, oriented x3 Psychiatry: Mood appropriate Extremities: 1+ bilateral pedal edema  Data Review: I have personally reviewed the laboratory data and studies available.  Recent Labs  Lab 10/27/19 1219  WBC 8.6  NEUTROABS 6.1  HGB 13.9  HCT 46.6*  MCV 89.1  PLT 283   Recent Labs  Lab 10/27/19 1219 10/28/19 0600  NA 143 144  K 4.2 4.6  CL 105 106  CO2 25 22  GLUCOSE 123* 122*  BUN 34* 32*  CREATININE 2.54* 2.60*  CALCIUM 9.2 9.6    Signed, Terrilee Croak, MD [facility] Pager: [phone] (Secure Chat preferred). 10/28/2019

## 2019-10-29 DIAGNOSIS — I10 Essential (primary) hypertension: Secondary | ICD-10-CM

## 2019-10-29 LAB — BASIC METABOLIC PANEL
Anion gap: 11 (ref 5–15)
BUN: 37 mg/dL — ABNORMAL HIGH (ref 8–23)
CO2: 28 mmol/L (ref 22–32)
Calcium: 9.4 mg/dL (ref 8.9–10.3)
Chloride: 104 mmol/L (ref 98–111)
Creatinine, Ser: 2.74 mg/dL — ABNORMAL HIGH (ref 0.44–1.00)
GFR calc Af Amer: 20 mL/min — ABNORMAL LOW (ref >60)
GFR calc non Af Amer: 18 mL/min — ABNORMAL LOW (ref >60)
Glucose, Bld: 126 mg/dL — ABNORMAL HIGH (ref 70–99)
Potassium: 3.9 mmol/L (ref 3.5–5.1)
Sodium: 143 mmol/L (ref 135–145)

## 2019-10-29 MED ORDER — BISACODYL 5 MG PO TBEC
10.0000 mg | DELAYED_RELEASE_TABLET | Freq: Once | ORAL | Status: AC
  Administered 2019-10-29: 10 mg via ORAL
  Filled 2019-10-29: qty 2

## 2019-10-29 MED ORDER — FLEET ENEMA 7-19 GM/118ML RE ENEM: 1.0000 | ENEMA | Freq: Every day | RECTAL | Status: DC | PRN

## 2019-10-29 NOTE — Progress Notes (Addendum)
PROGRESS NOTE  Sheila Leach  DOB: 01-Apr-1956  PCP: Shon Baton, MD JYN:[id number]  DOA: 10/27/2019 Admitted From: Home  LOS: 2 days   Chief Complaint  Patient presents with  . Leg Swelling  . Dysuria   Brief narrative: [name]  Is a 64 yo with hx of morbid obesity, HTN, HLD, CKD 3, hypothyroidism, nephrolithiasis HTN, morbid obesity, hypothyroidism. Patient presented to the ED on 3/26 with complaint of progressively worsening leg swelling associated with shortness of breath, now even at rest.  Started in January and has been progressive. >40 lbs weight gain 3 months. 3/19, patient had come to the ED with dyspnea. She was found to have lactic acidosis to 2.5 but no acute source of infection.  She was discharged on oral antibiotic and asked to follow-up with PCP.  Patient completed the course of oral antibiotic but did not feel better.  He went to her PCPs office on 3/26 and was subsequently sent to the ED for IV diuresis.   In the ED, patient was afebrile, tachycardic, blood pressure elevated to 170s.  Blood work showed creatinine elevated to 2.54, which is her baseline. Urinalysis with with some leukocytes and bacteria.  Chest x-ray showed vascular congestion, pleural effusions.  Her BNP was elevated to the 900s.  Patient was admitted to hospitalist service for further evaluation management  Subjective: Patient was seen and examined this morning.  Cardiologist Dr. Katharina Caper was at the room at the same time Sitting up in bed.   Apparently her Lasix drip ran out sometime in the night. Has not had good diuresis in last 24 hours. But not on supplemental oxygen this morning. Her biggest complaint is constipation today Creatinine not improving.  Assessment/Plan: Acute on chronic diastolic dysfunction CHF Essential hypertension -Patient presents for evaluation of shortness breath with exertion , lower extremity swelling and significant weight gain. -BNP was elevated and chest x-ray  showed stable vascular congestion and left pleural effusion -Patient was started on IV Lasix drip at 5 mg/h but apparently her drip ran out sometimes during the night and was not resumed for reasons unclear to me. -Patient did not have adequate diuresis in last 24 hours.  But not on supplemental oxygen this morning. -Creatinine keeps rising, 2.74 today. -Cardiology consult appreciated.  Continue metoprolol, hydralazine and nitrate. -Cardiogram 3/27 shows reduced EF to 35 to 40% also elevated RVSP to 41. -For now, we will continue diuresis with IV Lasix drip, continue to monitor urine output, blood pressure and renal function.  Acute respiratory failure with hypoxia -Patient required supplemental oxygen on admission.  Currently requiring none while sitting upright -However, she says she gets dyspneic on lying flat.  She may also have contributory sleep apnea.  But she has not had sleep study as an outpatient.  Elevated troponin -Troponin mildly elevated likely because of congestive heart failure. -Ultimately may need LHC and RHC, however limited by her poor creatinine clearance and allergy to contrast dye  CKD stage 4 -Creatinine at baseline less than 3.  Remains at baseline.  Continue to monitor on Lasix drip.  Constipation -No use with MiraLAX.  Give Dulcolax oral today.  If no output, try Fleet enema later.  Hypothyroidism -Born with goiter. -had a precancerous lesion and required total thyroidectomy at the age of 25.  She has been on thyroid replacement since then.  At home patient is on high dose 350 mcg of Synthroid.  However, TSH elevated to 24 on 10/20/19.  She follows up with an  endocrinologist as an outpatient.  History of bilateral nephrolithiasis -reports history of kidney stones that have required stent and nephrostomy tube placement approximately 2 years ago. -3/19, CT abdomen showed severe left renal atrophy with severe hydronephrosis due to a 4 mm calculus at left UPJ.   This finding was present in previous CT scan obtained on February 2019 as well. -Patient states she follows up with the urologist at [facility] u[facility].  Morbid obesity -Body mass index is 49.3 kg/m. Patient has been advised to make an attempt to improve diet and exercise patterns to aid in weight loss.  Probably has coexisting sleep apnea.  Needs sleep study as an outpatient.  Code Status:  Full code DVT prophylaxis:  Heparin subcu Antimicrobials:  None Fluid: None Diet: Low-sodium diet Mobility: Encourage ambulation Family Communication:  Not at bedside Discharge plan:  Anticipated date and disposition: Anticipate 2-3 more days of hospital stay Barriers: Needs further IV diuresis  Consultants:  Cardiology  Antimicrobials: Anti-infectives (From admission, onward)   None        Code Status: Full Code   Diet Order            Diet 2 gram sodium Room service appropriate? Yes; Fluid consistency: Thin  Diet effective now              Infusions:  . sodium chloride    . furosemide (LASIX) infusion 5 mg/hr (10/29/19 0908)    Scheduled Meds: . bisacodyl  10 mg Oral Once  . fenofibrate  160 mg Oral q morning - 10a  . furosemide  40 mg Intravenous Once  . heparin  5,000 Units Subcutaneous Q8H  . hydrALAZINE  25 mg Oral Q8H  . isosorbide mononitrate  30 mg Oral Daily  . levothyroxine  350 mcg Oral Q0600  . loratadine  10 mg Oral Daily  . metoprolol succinate  50 mg Oral Daily  . sodium bicarbonate  1,300 mg Oral Daily  . sodium chloride flush  3 mL Intravenous Q12H    PRN meds: sodium chloride, acetaminophen, ondansetron (ZOFRAN) IV, polyethylene glycol, sodium chloride flush, sodium phosphate   Objective: Vitals:   10/29/19 0904 10/29/19 1109  BP: 108/69 101/83  Pulse: 94 84  Resp:  20  Temp:  (!) 97.5 F (36.4 C)  SpO2: 96% 93%    Intake/Output Summary (Last 24 hours) at 10/29/2019 1125 Last data filed at 10/29/2019 0731 Gross per 24 hour  Intake 717 ml   Output 600 ml  Net 117 ml   Filed Weights   10/27/19 1028 10/28/19 1030 10/29/19 0500  Weight: (!) 144.2 kg (!) 142.4 kg (!) 142.8 kg   Weight change: -1.814 kg Body mass index is 49.3 kg/m.   Physical Exam: General exam: Appears calm and comfortable.  Morbidly obese female.  Not on supplemental oxygen Skin: No rashes, lesions or ulcers. HEENT: Atraumatic, normocephalic, supple neck, no obvious bleeding Lungs: Clear to auscultation bilaterally.  No crackles or wheezing CVS: Regular rate and rhythm, no murmur GI/Abd soft, distended from obesity, nontender, bowel sound present CNS: Alert, awake, oriented x3 Psychiatry: Mood appropriate Extremities: 1+ bilateral pedal edema  Data Review: I have personally reviewed the laboratory data and studies available.  Recent Labs  Lab 10/27/19 1219  WBC 8.6  NEUTROABS 6.1  HGB 13.9  HCT 46.6*  MCV 89.1  PLT 283   Recent Labs  Lab 10/27/19 1219 10/28/19 0600 10/29/19 0514  NA 143 144 143  K 4.2 4.6 3.9  CL 105  106 104  CO2 25 22 28   GLUCOSE 123* 122* 126*  BUN 34* 32* 37*  CREATININE 2.54* 2.60* 2.74*  CALCIUM 9.2 9.6 9.4    Signed, Terrilee Croak, MD [facility] Pager: [phone] (Secure Chat preferred). 10/29/2019

## 2019-10-29 NOTE — Progress Notes (Signed)
Progress Note  Patient Name: Sheila Leach Date of Encounter: 10/29/2019  Primary Cardiologist:   Maat Kafer   Subjective   64 year old female with a history of hypertension, hyperlipidemia, morbid obesity, CKD who I saw yesterday in consultation with congestive heart failure.  She was found to be profoundly hypothyroid with a TSH of 24. Echocardiogram in 2019 revealed normal left ventricular systolic function with an ejection fraction of 55%.  Echocardiogram yesterday revealed moderate the reduced left ventricular systolic function with an ejection fraction of 35 to 40%.   She had moderate pulmonary hypertension with an estimated PA pressure of 41 mmHg.  She had minimal diuresis because the continuous Lasix drip ran out at some point.  Inpatient Medications    Scheduled Meds: . fenofibrate  160 mg Oral q morning - 10a  . furosemide  40 mg Intravenous Once  . heparin  5,000 Units Subcutaneous Q8H  . hydrALAZINE  25 mg Oral Q8H  . isosorbide mononitrate  30 mg Oral Daily  . levothyroxine  350 mcg Oral Q0600  . loratadine  10 mg Oral Daily  . metoprolol succinate  50 mg Oral Daily  . sodium bicarbonate  1,300 mg Oral Daily  . sodium chloride flush  3 mL Intravenous Q12H   Continuous Infusions: . sodium chloride    . furosemide (LASIX) infusion 5 mg/hr (10/29/19 0908)   PRN Meds: sodium chloride, acetaminophen, ondansetron (ZOFRAN) IV, polyethylene glycol, sodium chloride flush   Vital Signs    Vitals:   10/28/19 2119 10/29/19 0458 10/29/19 0500 10/29/19 0904  BP: 117/90 127/88  108/69  Pulse: 87 87  94  Resp: 19 18    Temp: 97.8 F (36.6 C) 98.2 F (36.8 C)    TempSrc: Oral Oral    SpO2: 96% 99%  96%  Weight:   (!) 142.8 kg   Height:        Intake/Output Summary (Last 24 hours) at 10/29/2019 0915 Last data filed at 10/29/2019 0731 Gross per 24 hour  Intake 717 ml  Output 800 ml  Net -83 ml   Last 3 Weights 10/29/2019 10/28/2019 10/27/2019  Weight (lbs) 314 lb 12.8 oz  314 lb 318 lb  Weight (kg) 142.792 kg 142.429 kg 144.244 kg      Telemetry     NSR - Personally Reviewed  ECG     NSR  - Personally Reviewed  Physical Exam   GEN:  morbidly obese    Neck: No JVD Cardiac: RRR, no murmurs, rubs, or gallops.  Respiratory: Clear to auscultation bilaterally. GI: Soft, nontender, non-distended , very obese  MS: legs - 2 + pitting edema. , very obese  Neuro:  Nonfocal  Psych: Normal affect   Labs    High Sensitivity Troponin:   Recent Labs  Lab 10/27/19 1921 10/27/19 2108  TROPONINIHS 50* 46*      Chemistry Recent Labs  Lab 10/27/19 1219 10/28/19 0600 10/29/19 0514  NA 143 144 143  K 4.2 4.6 3.9  CL 105 106 104  CO2 25 22 28   GLUCOSE 123* 122* 126*  BUN 34* 32* 37*  CREATININE 2.54* 2.60* 2.74*  CALCIUM 9.2 9.6 9.4  PROT 6.9  --   --   ALBUMIN 4.4  --   --   AST 24  --   --   ALT 17  --   --   ALKPHOS 29*  --   --   BILITOT 0.9  --   --   GFRNONAA 19*  19* 18*  GFRAA 22* 22* 20*  ANIONGAP 13 16* 11     Hematology Recent Labs  Lab 10/27/19 1219  WBC 8.6  RBC 5.23*  HGB 13.9  HCT 46.6*  MCV 89.1  MCH 26.6  MCHC 29.8*  RDW 14.6  PLT 283    BNP Recent Labs  Lab 10/27/19 1255  BNP 925.2*     DDimer No results for input(s): DDIMER in the last 168 hours.   Radiology    DG Chest 2 View  Result Date: 10/27/2019 CLINICAL DATA:  Bilateral lower extremity edema, short of breath EXAM: CHEST - 2 VIEW COMPARISON:  10/20/2019 FINDINGS: Frontal and lateral views of the chest are obtained. The cardiac silhouette is enlarged but stable. There is a small left pleural effusion. Chronic central vascular congestion. No airspace disease or pneumothorax. No acute bony abnormalities. IMPRESSION: 1. Stable vascular congestion and left pleural effusion. Electronically Signed   By: Randa Ngo M.D.   On: 10/27/2019 11:25   ECHOCARDIOGRAM COMPLETE  Result Date: 10/28/2019    ECHOCARDIOGRAM REPORT   Patient Name:   Sheila Leach  Date of Exam: 10/28/2019 Medical Rec #:  [id number]  Height:       67.0 in Accession #:    [id number] Weight:       318.0 lb Date of Birth:  10/25/1955  BSA:          2.463 m Patient Age:    82 years   BP:           146/99 mmHg Patient Gender: F          HR:           99 bpm. Exam Location:  Inpatient Procedure: 2D Echo Indications:    428.31 CHF  History:        Patient has prior history of Echocardiogram examinations, most                 recent 09/21/2017. Risk Factors:Hypertension and Dyslipidemia.  Sonographer:    Jannett Celestine RDCS (AE) Referring Phys: [id number] TO[place] AGBATA  Sonographer Comments: Patient is morbidly obese. Image acquisition challenging due to patient body habitus, Image acquisition challenging due to respiratory motion and restricted mobility. IMPRESSIONS  1. Left ventricular ejection fraction, by estimation, is 35 to 40%. The left ventricle has moderately decreased function. The left ventricle demonstrates global hypokinesis. The left ventricular internal cavity size was mildly dilated. Left ventricular diastolic parameters are indeterminate.  2. Right ventricular systolic function was not well visualized. The right ventricular size is not well visualized. There is moderately elevated pulmonary artery systolic pressure.  3. The mitral valve is grossly normal. Mild mitral valve regurgitation. No evidence of mitral stenosis.  4. The aortic valve was not well visualized. Aortic valve regurgitation is not visualized. FINDINGS  Left Ventricle: Left ventricular ejection fraction, by estimation, is 35 to 40%. The left ventricle has moderately decreased function. The left ventricle demonstrates global hypokinesis. The left ventricular internal cavity size was mildly dilated. There is no left ventricular hypertrophy. Left ventricular diastolic parameters are indeterminate. Right Ventricle: The right ventricular size is not well visualized. Right vetricular wall thickness was not assessed. Right  ventricular systolic function was not well visualized. There is moderately elevated pulmonary artery systolic pressure. The tricuspid regurgitant velocity is 2.57 m/s, and with an assumed right atrial pressure of 15 mmHg, the estimated right ventricular systolic pressure is 31.5 mmHg. Left Atrium: Left atrial size was not well visualized. Right Atrium:  Right atrial size was not well visualized. Pericardium: There is no evidence of pericardial effusion. Mitral Valve: The mitral valve is grossly normal. Mild mitral valve regurgitation. No evidence of mitral valve stenosis. Tricuspid Valve: The tricuspid valve is grossly normal. Tricuspid valve regurgitation is trivial. No evidence of tricuspid stenosis. Aortic Valve: The aortic valve was not well visualized. Aortic valve regurgitation is not visualized. Pulmonic Valve: The pulmonic valve was normal in structure. Pulmonic valve regurgitation is not visualized. No evidence of pulmonic stenosis. Aorta: The aortic root and ascending aorta are structurally normal, with no evidence of dilitation. IAS/Shunts: The atrial septum is grossly normal.  LEFT VENTRICLE PLAX 2D LVIDd:         5.50 cm LVIDs:         4.10 cm LV PW:         1.30 cm LV IVS:        0.90 cm LVOT diam:     2.00 cm LV SV:         30 LV SV Index:   12 LVOT Area:     3.14 cm  LEFT ATRIUM         Index LA diam:    4.20 cm 1.71 cm/m  AORTIC VALVE LVOT Vmax:   64.00 cm/s LVOT Vmean:  46.800 cm/s LVOT VTI:    0.096 m  AORTA Ao Root diam: 2.25 cm MITRAL VALVE               TRICUSPID VALVE MV Area (PHT): 8.43 cm    TR Peak grad:   26.4 mmHg MV Decel Time: 90 msec     TR Vmax:        257.00 cm/s MV E velocity: 87.80 cm/s                            SHUNTS                            Systemic VTI:  0.10 m                            Systemic Diam: 2.00 cm Mertie Moores MD Electronically signed by Mertie Moores MD Signature Date/Time: 10/28/2019/3:11:25 PM    Final     Cardiac Studies     Patient Profile     64  y.o. female with CHF and morbid obesity   Assessment & Plan    1.   Acute on chronic systolic congestive heart failure: Patient's ejection fraction is reduced compared to her previous echocardiogram in 2019.  This may be due to excessive volume overload.  She is not had any signs or symptoms of heart attack.  Her creatinine is elevated and so performing heart catheterization would be difficult.  In addition she has an anaphylactic reaction to the IV contrast.  We will continue with continuous IV Lasix drip. We will continue to watch her creatinine carefully. I have advised her to elevate her legs but she cannot breathe when she is lying flat.  She may have sleep apnea and may need to have CPAP or BiPAP in order to lie down adequately.  We could consider a Right heart cath without IV contrast.   2.  Chronic kidney disease: Discussed with Dr. Pietro Cassis .  He is considering nephrology consult to help with this.  3.  Moderate pulmonary hypertension.  I suspect that she has a good bit of obesity hypoventilation.  She may also have sleep apnea.  This will make diuresis of her leg edema quite difficult.  Continue to diurese her.  4.  Morbid obesity.  I strongly recommended weight loss.      For questions or updates, please contact [place] Please consult [email or web address] for contact info under        Signed, Mertie Moores, MD  10/29/2019, 9:15 AM

## 2019-10-30 LAB — BASIC METABOLIC PANEL
Anion gap: 13 (ref 5–15)
BUN: 39 mg/dL — ABNORMAL HIGH (ref 8–23)
CO2: 26 mmol/L (ref 22–32)
Calcium: 9.2 mg/dL (ref 8.9–10.3)
Chloride: 102 mmol/L (ref 98–111)
Creatinine, Ser: 2.84 mg/dL — ABNORMAL HIGH (ref 0.44–1.00)
GFR calc Af Amer: 20 mL/min — ABNORMAL LOW (ref >60)
GFR calc non Af Amer: 17 mL/min — ABNORMAL LOW (ref >60)
Glucose, Bld: 108 mg/dL — ABNORMAL HIGH (ref 70–99)
Potassium: 4 mmol/L (ref 3.5–5.1)
Sodium: 141 mmol/L (ref 135–145)

## 2019-10-30 LAB — CBC
HCT: 43 % (ref 36.0–46.0)
Hemoglobin: 12.7 g/dL (ref 12.0–15.0)
MCH: 26.6 pg (ref 26.0–34.0)
MCHC: 29.5 g/dL — ABNORMAL LOW (ref 30.0–36.0)
MCV: 90.1 fL (ref 80.0–100.0)
Platelets: 266 10*3/uL (ref 150–400)
RBC: 4.77 MIL/uL (ref 3.87–5.11)
RDW: 14.5 % (ref 11.5–15.5)
WBC: 8 10*3/uL (ref 4.0–10.5)
nRBC: 0 % (ref 0.0–0.2)

## 2019-10-30 NOTE — Progress Notes (Addendum)
Progress Note  Patient Name: Sheila Leach Date of Encounter: 10/30/2019  Primary Cardiologist:   Edword Cu   Subjective   64 year old female with a history of hypertension, hyperlipidemia, morbid obesity, CKD who I saw yesterday in consultation with congestive heart failure.  She was found to be profoundly hypothyroid with a TSH of 24. Echocardiogram in 2019 revealed normal left ventricular systolic function with an ejection fraction of 55%.  Echocardiogram yesterday revealed moderate the reduced left ventricular systolic function with an ejection fraction of 35 to 40%.   She had moderate pulmonary hypertension with an estimated PA pressure of 41 mmHg.  She had minimal diuresis 03/27-28 because the continuous Lasix drip ran out at some point.  03/29: pt says has been urinating a great deal.  She has not been over drinking.  However, weight is minimally decreased   Inpatient Medications    Scheduled Meds: . fenofibrate  160 mg Oral q morning - 10a  . furosemide  40 mg Intravenous Once  . heparin  5,000 Units Subcutaneous Q8H  . hydrALAZINE  25 mg Oral Q8H  . isosorbide mononitrate  30 mg Oral Daily  . levothyroxine  350 mcg Oral Q0600  . loratadine  10 mg Oral Daily  . metoprolol succinate  50 mg Oral Daily  . sodium bicarbonate  1,300 mg Oral Daily  . sodium chloride flush  3 mL Intravenous Q12H   Continuous Infusions: . sodium chloride    . furosemide (LASIX) infusion 5 mg/hr (10/29/19 1748)   PRN Meds: sodium chloride, acetaminophen, ondansetron (ZOFRAN) IV, polyethylene glycol, sodium chloride flush, sodium phosphate   Vital Signs    Vitals:   10/29/19 1335 10/29/19 1523 10/29/19 2052 10/30/19 0442  BP: 117/78 131/89 (!) 140/93 (!) 129/91  Pulse: 92 88 97 90  Resp: 19 17 18 18   Temp: (!) 97.5 F (36.4 C) 98.7 F (37.1 C) 97.6 F (36.4 C) 97.8 F (36.6 C)  TempSrc: Oral Oral Oral Oral  SpO2: 98% 100% 98% 97%  Weight:      Height:        Intake/Output Summary  (Last 24 hours) at 10/30/2019 0948 Last data filed at 10/30/2019 0800 Gross per 24 hour  Intake 521.46 ml  Output 2400 ml  Net -1878.54 ml   Last 3 Weights 10/29/2019 10/28/2019 10/27/2019  Weight (lbs) 314 lb 12.8 oz 314 lb 318 lb  Weight (kg) 142.792 kg 142.429 kg 144.244 kg      Telemetry    Sinus rhythm, PVCs, 7 beat run of nonsustained VT- Personally Reviewed  ECG    None today- Personally Reviewed  Physical Exam   GEN: No acute distress.   Neck: JVD 9-10 cm Cardiac: RRR, no murmurs, rubs, or gallops.  Respiratory:  Decreased breath sounds bases. GI: Soft, nontender, non-distended  MS:  2+ edema; No deformity. Neuro:  Nonfocal  Psych: Normal affect   Labs    High Sensitivity Troponin:   Recent Labs  Lab 10/27/19 1921 10/27/19 2108  TROPONINIHS 50* 46*      Chemistry Recent Labs  Lab 10/27/19 1219 10/27/19 1219 10/28/19 0600 10/29/19 0514 10/30/19 0457  NA 143   < > 144 143 141  K 4.2   < > 4.6 3.9 4.0  CL 105   < > 106 104 102  CO2 25   < > 22 28 26   GLUCOSE 123*   < > 122* 126* 108*  BUN 34*   < > 32* 37* 39*  CREATININE  2.54*   < > 2.60* 2.74* 2.84*  CALCIUM 9.2   < > 9.6 9.4 9.2  PROT 6.9  --   --   --   --   ALBUMIN 4.4  --   --   --   --   AST 24  --   --   --   --   ALT 17  --   --   --   --   ALKPHOS 29*  --   --   --   --   BILITOT 0.9  --   --   --   --   GFRNONAA 19*   < > 19* 18* 17*  GFRAA 22*   < > 22* 20* 20*  ANIONGAP 13   < > 16* 11 13   < > = values in this interval not displayed.     Hematology Recent Labs  Lab 10/27/19 1219  WBC 8.6  RBC 5.23*  HGB 13.9  HCT 46.6*  MCV 89.1  MCH 26.6  MCHC 29.8*  RDW 14.6  PLT 283    BNP Recent Labs  Lab 10/27/19 1255  BNP 925.2*    No results found for: HGBA1C Lab Results  Component Value Date   TSH 24.305 (H) 10/20/2019   Lab Results  Component Value Date   TSH 24.305 (H) 10/20/2019   T3TOTAL 21 (L) 09/20/2017   Free T4  Date Value Ref Range Status  10/20/2019  1.83 (H) 0.61 - 1.12 ng/dL Final    Comment:    (NOTE) Biotin ingestion may interfere with free T4 tests. If the results are inconsistent with the TSH level, previous test results, or the clinical presentation, then consider biotin interference. If needed, order repeat testing after stopping biotin. Performed at [facility], [place] [address], [place], [place] [address]      Radiology    ECHOCARDIOGRAM COMPLETE  Result Date: 10/28/2019    ECHOCARDIOGRAM REPORT   Patient Name:   Sheila Leach Date of Exam: 10/28/2019 Medical Rec #:  [id number]  Height:       67.0 in Accession #:    [id number] Weight:       318.0 lb Date of Birth:  07/16/1956  BSA:          2.463 m Patient Age:    64 years   BP:           146/99 mmHg Patient Gender: F          HR:           99 bpm. Exam Location:  Inpatient Procedure: 2D Echo Indications:    428.31 CHF  History:        Patient has prior history of Echocardiogram examinations, most                 recent 09/21/2017. Risk Factors:Hypertension and Dyslipidemia.  Sonographer:    Jannett Celestine RDCS (AE) Referring Phys: [id number] TOCHUKWU AGBATA  Sonographer Comments: Patient is morbidly obese. Image acquisition challenging due to patient body habitus, Image acquisition challenging due to respiratory motion and restricted mobility. IMPRESSIONS  1. Left ventricular ejection fraction, by estimation, is 35 to 40%. The left ventricle has moderately decreased function. The left ventricle demonstrates global hypokinesis. The left ventricular internal cavity size was mildly dilated. Left ventricular diastolic parameters are indeterminate.  2. Right ventricular systolic function was not well visualized. The right ventricular size is not well visualized. There is moderately elevated pulmonary artery  systolic pressure.  3. The mitral valve is grossly normal. Mild mitral valve regurgitation. No evidence of mitral stenosis.  4. The aortic valve was not well visualized. Aortic valve  regurgitation is not visualized. FINDINGS  Left Ventricle: Left ventricular ejection fraction, by estimation, is 35 to 40%. The left ventricle has moderately decreased function. The left ventricle demonstrates global hypokinesis. The left ventricular internal cavity size was mildly dilated. There is no left ventricular hypertrophy. Left ventricular diastolic parameters are indeterminate. Right Ventricle: The right ventricular size is not well visualized. Right vetricular wall thickness was not assessed. Right ventricular systolic function was not well visualized. There is moderately elevated pulmonary artery systolic pressure. The tricuspid regurgitant velocity is 2.57 m/s, and with an assumed right atrial pressure of 15 mmHg, the estimated right ventricular systolic pressure is 06.3 mmHg. Left Atrium: Left atrial size was not well visualized. Right Atrium: Right atrial size was not well visualized. Pericardium: There is no evidence of pericardial effusion. Mitral Valve: The mitral valve is grossly normal. Mild mitral valve regurgitation. No evidence of mitral valve stenosis. Tricuspid Valve: The tricuspid valve is grossly normal. Tricuspid valve regurgitation is trivial. No evidence of tricuspid stenosis. Aortic Valve: The aortic valve was not well visualized. Aortic valve regurgitation is not visualized. Pulmonic Valve: The pulmonic valve was normal in structure. Pulmonic valve regurgitation is not visualized. No evidence of pulmonic stenosis. Aorta: The aortic root and ascending aorta are structurally normal, with no evidence of dilitation. IAS/Shunts: The atrial septum is grossly normal.  LEFT VENTRICLE PLAX 2D LVIDd:         5.50 cm LVIDs:         4.10 cm LV PW:         1.30 cm LV IVS:        0.90 cm LVOT diam:     2.00 cm LV SV:         30 LV SV Index:   12 LVOT Area:     3.14 cm  LEFT ATRIUM         Index LA diam:    4.20 cm 1.71 cm/m  AORTIC VALVE LVOT Vmax:   64.00 cm/s LVOT Vmean:  46.800 cm/s LVOT VTI:     0.096 m  AORTA Ao Root diam: 2.25 cm MITRAL VALVE               TRICUSPID VALVE MV Area (PHT): 8.43 cm    TR Peak grad:   26.4 mmHg MV Decel Time: 90 msec     TR Vmax:        257.00 cm/s MV E velocity: 87.80 cm/s                            SHUNTS                            Systemic VTI:  0.10 m                            Systemic Diam: 2.00 cm Mertie Moores MD Electronically signed by Mertie Moores MD Signature Date/Time: 10/28/2019/3:11:25 PM    Final     Cardiac Studies   ECHO: 10/29/2019 1. Left ventricular ejection fraction, by estimation, is 35 to 40%. The  left ventricle has moderately decreased function. The left ventricle  demonstrates global hypokinesis. The left ventricular internal  cavity size  was mildly dilated. Left ventricular  diastolic parameters are indeterminate.  2. Right ventricular systolic function was not well visualized. The right  ventricular size is not well visualized. There is moderately elevated  pulmonary artery systolic pressure.  3. The mitral valve is grossly normal. Mild mitral valve regurgitation.  No evidence of mitral stenosis.  4. The aortic valve was not well visualized. Aortic valve regurgitation  is not visualized.   Patient Profile     63 y.o. female with CHF and morbid obesity   Assessment & Plan    1.   Acute on chronic systolic congestive heart failure:  -Her EF is decreased from previous values -She does not know how much weight she gained prior to admission -She says she has been urinating frequently, but intake/output are net -1.9 L since admission, weight down 4 pounds -Creatinine is trending up, potassium is stable -Lasix drip is currently at 5 mg/h, discuss increasing the drip to 7.5 mg/h with MD versus adding metolazone -Her troponins were not significantly elevated, no history of ischemic symptoms, and no regional wall motion abnormalities on echo, so no ischemic evaluation is planned at this time. -Additional risk factors for  any dye study are elevated creatinine and anaphylaxis to IV dye -Sleep apnea is likely as is OHS, suspect orthopnea is chronic regardless of volume status -Right heart cath would be possible, if that would give [place] useful information  2.  Chronic kidney disease: -Creatinine is increasing with diuresis -Consider Nephrology consult  3.  Moderate pulmonary hypertension.  -Suspect OSA and OHS contribute -Continue to follow  4.  Morbid obesity.  -May benefit from nutritional consult, per IM  5.  Hypothyroid: -TSH was elevated on admission, T3 low and free T4 high -She is on her home dose of Synthroid -Per IM     For questions or updates, please contact [place] Please consult [email or web address] for contact info under        Signed, Rosaria Ferries, PA-C  10/30/2019, 9:48 AM     Attending Note:   The patient was seen and examined.  Agree with assessment and plan as noted above.  Changes made to the above note as needed.  Patient seen and independently examined with  Rosaria Ferries, PA .   We discussed all aspects of the encounter. I agree with the assessment and plan as stated above.  1.   Acute on chronic systolic and diastolic congestive heart failure: Ejection fraction 35 to 40%.  She is continued to diurese.  Creatinine has climbed slowly.  She may ultimately need a nephrology consultation. Net - 2.3 liters off so far this admission   Most of the her edema is in her abdomen and legs.  This also might be venous insufficiency to some extent. Advised her to elevate her legs.   2.  Pulmonary hypertension: Her pulmonary hypertension is likely multifactorial.  She likely has obesity hypoventilation or perhaps sleep apnea.   I have spent a total of 40 minutes with patient reviewing hospital  notes , telemetry, EKGs, labs and examining patient as well as establishing an assessment and plan that was discussed with the patient. > 50% of time was spent in direct patient care.     Thayer Headings, Brooke Bonito., MD, [facility] 10/30/2019, 12:31 PM [address]  [place] Pager [phone]

## 2019-10-30 NOTE — Progress Notes (Signed)
PROGRESS NOTE  Sheila Leach  DOB: 19-Aug-1955  PCP: Shon Baton, MD SHF:[id number]  DOA: 10/27/2019  LOS: 3 days   Chief Complaint  Patient presents with  . Leg Swelling  . Dysuria   Brief narrative: [name]  Is a 64 yo with hx of morbid obesity, HTN, HLD, CKD 3, hypothyroidism, nephrolithiasis HTN, morbid obesity, hypothyroidism. Patient presented to the ED on 3/26 with complaint of progressively worsening leg swelling associated with shortness of breath, now even at rest.  Started in January and has been progressive. >40 lbs weight gain 3 months. 3/19, patient had come to the ED with dyspnea. She was found to have lactic acidosis to 2.5 but no acute source of infection.  She was discharged on oral antibiotic and asked to follow-up with PCP.  Patient completed the course of oral antibiotic but did not feel better.  He went to her PCPs office on 3/26 and was subsequently sent to the ED for IV diuresis.   In the ED, patient was afebrile, tachycardic, blood pressure elevated to 170s.  Blood work showed creatinine elevated to 2.54, which is her baseline. Urinalysis with with some leukocytes and bacteria.  Chest x-ray showed vascular congestion, pleural effusions.  Her BNP was elevated to the 900s.  Patient was admitted to hospitalist service for further evaluation management  Subjective: Patient was seen and examined this morning.   Sitting up in bed.  Not in distress.  Not on oxygen supplementation.  On Lasix drip.  More than 3 L of urine output since admission.    Assessment/Plan: Acute on chronic diastolic dysfunction CHF Essential hypertension -Patient presents for evaluation of shortness breath with exertion , lower extremity swelling and significant weight gain. -BNP was elevated and chest x-ray showed stable vascular congestion and left pleural effusion -Patient was started on IV Lasix drip at 5 mg/h.  More than 3 L of urine output since admission. -Cardiology consult  appreciated. Continue metoprolol, hydralazine and nitrate. -Cardiogram 3/27 shows reduced EF to 35 to 40% also elevated RVSP to 41. -For now, we will continue diuresis with IV Lasix drip, continue to monitor urine output, blood pressure and renal function.  Acute respiratory failure with hypoxia -Patient required supplemental oxygen on admission.  Currently requiring none while sitting upright -However, she says she gets dyspneic on lying flat.  She may also have contributory sleep apnea.  But she has not had sleep study as an outpatient. -Ordered to ambulate on the hallway to check for oxygen requirement.  Elevated troponin -Troponin mildly elevated likely because of congestive heart failure. -Ultimately may need LHC and RHC, however limited by her poor creatinine clearance and allergy to contrast dye  CKD stage 4 -Creatinine at baseline less than 3.  Steadily rising creatinine but is still remains at baseline range.  Continue to monitor on Lasix drip.  Constipation -No use with MiraLAX.  Give Dulcolax oral today.  If no output, try Fleet enema later.  Hypothyroidism -Born with goiter. -had a precancerous lesion and required total thyroidectomy at the age of 23.  She has been on thyroid replacement since then.  At home patient is on high dose 350 mcg of Synthroid.  However, TSH elevated to 24 on 10/20/19.  She follows up with an endocrinologist as an outpatient.  History of bilateral nephrolithiasis -reports history of kidney stones that have required stent and nephrostomy tube placement approximately 2 years ago. -3/19, CT abdomen showed severe left renal atrophy with severe hydronephrosis due to a  4 mm calculus at left UPJ.  This finding was present in previous CT scan obtained on February 2019 as well. -Patient states she follows up with the urologist at [facility] urology.  Morbid obesity -Body mass index is 49.23 kg/m. Patient has been advised to make an attempt to improve diet  and exercise patterns to aid in weight loss.  Probably has coexisting sleep apnea.  Needs sleep study as an outpatient.  Mobility: Encourage ambulation Code Status:  Full code   DVT prophylaxis:  Heparin subcu Antimicrobials:  None Fluid: None Diet: Low-sodium diet  Family Communication:  None at bedside Admitted From: Home Anticipated date and disposition: Home likely tomorrow Barriers: Continue diuresis  Consultants:  Cardiology  Antimicrobials: Anti-infectives (From admission, onward)   None        Code Status: Full Code   Diet Order            Diet 2 gram sodium Room service appropriate? Yes; Fluid consistency: Thin  Diet effective now              Infusions:  . sodium chloride    . furosemide (LASIX) infusion 5 mg/hr (10/29/19 1748)    Scheduled Meds: . fenofibrate  160 mg Oral q morning - 10a  . furosemide  40 mg Intravenous Once  . heparin  5,000 Units Subcutaneous Q8H  . hydrALAZINE  25 mg Oral Q8H  . isosorbide mononitrate  30 mg Oral Daily  . levothyroxine  350 mcg Oral Q0600  . loratadine  10 mg Oral Daily  . metoprolol succinate  50 mg Oral Daily  . sodium bicarbonate  1,300 mg Oral Daily  . sodium chloride flush  3 mL Intravenous Q12H    PRN meds: sodium chloride, acetaminophen, ondansetron (ZOFRAN) IV, polyethylene glycol, sodium chloride flush, sodium phosphate   Objective: Vitals:   10/30/19 1112 10/30/19 1356  BP:  126/77  Pulse:  91  Resp:  18  Temp:  97.6 F (36.4 C)  SpO2: 97% 98%    Intake/Output Summary (Last 24 hours) at 10/30/2019 1428 Last data filed at 10/30/2019 1357 Gross per 24 hour  Intake 377.6 ml  Output 3100 ml  Net -2722.4 ml   Filed Weights   10/28/19 1030 10/29/19 0500 10/30/19 0900  Weight: (!) 142.4 kg (!) 142.8 kg (!) 142.6 kg   Weight change:  Body mass index is 49.23 kg/m.   Physical Exam: General exam: Appears calm and comfortable.  Morbidly obese female.  Not on supplemental oxygen Skin: No  rashes, lesions or ulcers. HEENT: Atraumatic, normocephalic, supple neck, no obvious bleeding Lungs: Clear to auscultation bilaterally, no crackles or wheezing CVS: Regular rate and rhythm, no murmur GI/Abd soft, distended from obesity, nontender, bowel sound present CNS: Alert, awake, oriented x3 Psychiatry: Mood appropriate Extremities: 1+ bilateral pedal edema gradually improving  Data Review: I have personally reviewed the laboratory data and studies available.  Recent Labs  Lab 10/27/19 1219  WBC 8.6  NEUTROABS 6.1  HGB 13.9  HCT 46.6*  MCV 89.1  PLT 283   Recent Labs  Lab 10/27/19 1219 10/28/19 0600 10/29/19 0514 10/30/19 0457  NA 143 144 143 141  K 4.2 4.6 3.9 4.0  CL 105 106 104 102  CO2 25 22 28 26   GLUCOSE 123* 122* 126* 108*  BUN 34* 32* 37* 39*  CREATININE 2.54* 2.60* 2.74* 2.84*  CALCIUM 9.2 9.6 9.4 9.2    Signed, Terrilee Croak, MD [facility] Pager: [phone] (Secure Chat preferred). 10/30/2019

## 2019-10-30 NOTE — Care Management Important Message (Signed)
Important Message  Patient Details IM Letter given to Dessa Phi RN Case Manager to present to the Patient Name: Sheila Leach MRN: [id number] Date of Birth: Jan 05, 1956   Medicare Important Message Given:  Yes     Kerin Salen 10/30/2019, 11:55 AM

## 2019-10-30 NOTE — Progress Notes (Signed)
Pharmacist Heart Failure Core Measure Documentation  Assessment: Sheila Leach has an EF documented as 35-40% on 10/29/19 by echo.  Rationale: Heart failure patients with left ventricular systolic dysfunction (LVSD) and an EF < 40% should be prescribed an angiotensin converting enzyme inhibitor (ACEI) or angiotensin receptor blocker (ARB) at discharge unless a contraindication is documented in the medical record.  This patient is not currently on an ACEI or ARB for HF.  This note is being placed in the record in order to provide documentation that a contraindication to the use of these agents is present for this encounter.  ACE Inhibitor or Angiotensin Receptor Blocker is contraindicated (specify all that apply)  []   ACEI allergy AND ARB allergy []   Angioedema []   Moderate or severe aortic stenosis []   Hyperkalemia []   Hypotension []   Renal artery stenosis [x]   Worsening renal function, preexisting renal disease or dysfunction   Eudelia Bunch, Pharm.D [phone] 10/30/2019 2:39 PM

## 2019-10-31 LAB — GLUCOSE, CAPILLARY
Glucose-Capillary: 105 mg/dL — ABNORMAL HIGH (ref 70–99)
Glucose-Capillary: 93 mg/dL (ref 70–99)

## 2019-10-31 LAB — BASIC METABOLIC PANEL
Anion gap: 11 (ref 5–15)
BUN: 41 mg/dL — ABNORMAL HIGH (ref 8–23)
CO2: 29 mmol/L (ref 22–32)
Calcium: 9.2 mg/dL (ref 8.9–10.3)
Chloride: 102 mmol/L (ref 98–111)
Creatinine, Ser: 3.03 mg/dL — ABNORMAL HIGH (ref 0.44–1.00)
GFR calc Af Amer: 18 mL/min — ABNORMAL LOW (ref >60)
GFR calc non Af Amer: 16 mL/min — ABNORMAL LOW (ref >60)
Glucose, Bld: 115 mg/dL — ABNORMAL HIGH (ref 70–99)
Potassium: 3.8 mmol/L (ref 3.5–5.1)
Sodium: 142 mmol/L (ref 135–145)

## 2019-10-31 MED ORDER — HYDRALAZINE HCL 25 MG PO TABS: 25.0000 mg | ORAL_TABLET | Freq: Three times a day (TID) | ORAL | 2 refills | Status: DC

## 2019-10-31 MED ORDER — CEPHALEXIN 250 MG PO CAPS
250.0000 mg | ORAL_CAPSULE | Freq: Two times a day (BID) | ORAL | Status: DC
  Administered 2019-10-31: 250 mg via ORAL
  Filled 2019-10-31 (×2): qty 1

## 2019-10-31 MED ORDER — CEPHALEXIN 250 MG PO CAPS: 250.0000 mg | ORAL_CAPSULE | Freq: Two times a day (BID) | ORAL | 0 refills | Status: AC

## 2019-10-31 MED ORDER — TORSEMIDE 20 MG PO TABS: 40.0000 mg | ORAL_TABLET | Freq: Every day | ORAL | 0 refills | Status: DC

## 2019-10-31 MED ORDER — SACCHAROMYCES BOULARDII 250 MG PO CAPS: 250.0000 mg | ORAL_CAPSULE | Freq: Two times a day (BID) | ORAL | 0 refills | Status: AC

## 2019-10-31 MED ORDER — METOPROLOL SUCCINATE ER 50 MG PO TB24: 50.0000 mg | ORAL_TABLET | Freq: Every day | ORAL | 0 refills | Status: DC

## 2019-10-31 MED ORDER — ISOSORBIDE MONONITRATE ER 30 MG PO TB24: 30.0000 mg | ORAL_TABLET | Freq: Every day | ORAL | 0 refills | Status: DC

## 2019-10-31 NOTE — Progress Notes (Addendum)
Progress Note  Patient Name: Sheila Leach Date of Encounter: 10/31/2019  Primary Cardiologist:   Parley Pidcock   Subjective   64 year old female with a history of hypertension, hyperlipidemia, morbid obesity, CKD who I saw yesterday in consultation with congestive heart failure.  She was found to be profoundly hypothyroid with a TSH of 24. Echocardiogram in 2019 revealed normal left ventricular systolic function with an ejection fraction of 55%.  Echocardiogram yesterday revealed moderate the reduced left ventricular systolic function with an ejection fraction of 35 to 40%.   She had moderate pulmonary hypertension with an estimated PA pressure of 41 mmHg.  She had minimal diuresis 03/27-28 because the continuous Lasix drip ran out at some point.  03/30: IM d/c'd Lasix gtt due to increased Cr She has urinated frequently, does not understand why her weight is not changing much. Wt was done w/ standing scale. Developed a blister on R foot, new today. Breathing ok, edema not much better   Inpatient Medications    Scheduled Meds: . fenofibrate  160 mg Oral q morning - 10a  . furosemide  40 mg Intravenous Once  . heparin  5,000 Units Subcutaneous Q8H  . hydrALAZINE  25 mg Oral Q8H  . isosorbide mononitrate  30 mg Oral Daily  . levothyroxine  350 mcg Oral Q0600  . loratadine  10 mg Oral Daily  . metoprolol succinate  50 mg Oral Daily  . sodium bicarbonate  1,300 mg Oral Daily  . sodium chloride flush  3 mL Intravenous Q12H   Continuous Infusions: . sodium chloride     PRN Meds: sodium chloride, acetaminophen, ondansetron (ZOFRAN) IV, polyethylene glycol, sodium chloride flush, sodium phosphate   Vital Signs    Vitals:   10/30/19 1356 10/30/19 2045 10/31/19 0404 10/31/19 0406  BP: 126/77 113/75 124/87   Pulse: 91 95 87   Resp: 18 20 18    Temp: 97.6 F (36.4 C) 97.9 F (36.6 C) 98 F (36.7 C)   TempSrc: Oral Oral Oral   SpO2: 98% 100% 98%   Weight:    (!) 142.5 kg  Height:         Intake/Output Summary (Last 24 hours) at 10/31/2019 0759 Last data filed at 10/31/2019 0700 Gross per 24 hour  Intake 600 ml  Output 3550 ml  Net -2950 ml   Last 3 Weights 10/31/2019 10/30/2019 10/29/2019  Weight (lbs) 314 lb 1.6 oz 314 lb 4.8 oz 314 lb 12.8 oz  Weight (kg) 142.475 kg 142.566 kg 142.792 kg      Telemetry    Irregular beats, thought to be Afib but suspect SR w/ PACs, also w/ PVCs- Personally Reviewed  ECG    None today- Personally Reviewed  Physical Exam   GEN: No acute distress.   Neck: mild JVD Cardiac: RRR, no murmurs, rubs, or gallops.  Respiratory: decreased BS bases bilaterally  GI: Soft, nontender, non-distended  MS: 2+ edema; No deformity. Blister on R foot is dressed, not disturbed Neuro:  Nonfocal  Psych: Normal affect   Labs    High Sensitivity Troponin:   Recent Labs  Lab 10/27/19 1921 10/27/19 2108  TROPONINIHS 50* 46*      Chemistry Recent Labs  Lab 10/27/19 1219 10/28/19 0600 10/29/19 0514 10/30/19 0457 10/31/19 0542  NA 143   < > 143 141 142  K 4.2   < > 3.9 4.0 3.8  CL 105   < > 104 102 102  CO2 25   < > 28 26  29  GLUCOSE 123*   < > 126* 108* 115*  BUN 34*   < > 37* 39* 41*  CREATININE 2.54*   < > 2.74* 2.84* 3.03*  CALCIUM 9.2   < > 9.4 9.2 9.2  PROT 6.9  --   --   --   --   ALBUMIN 4.4  --   --   --   --   AST 24  --   --   --   --   ALT 17  --   --   --   --   ALKPHOS 29*  --   --   --   --   BILITOT 0.9  --   --   --   --   GFRNONAA 19*   < > 18* 17* 16*  GFRAA 22*   < > 20* 20* 18*  ANIONGAP 13   < > 11 13 11    < > = values in this interval not displayed.     Hematology Recent Labs  Lab 10/27/19 1219 10/30/19 2203  WBC 8.6 8.0  RBC 5.23* 4.77  HGB 13.9 12.7  HCT 46.6* 43.0  MCV 89.1 90.1  MCH 26.6 26.6  MCHC 29.8* 29.5*  RDW 14.6 14.5  PLT 283 266    BNP Recent Labs  Lab 10/27/19 1255  BNP 925.2*    No results found for: HGBA1C Lab Results  Component Value Date   TSH 24.305 (H)  10/20/2019   Lab Results  Component Value Date   TSH 24.305 (H) 10/20/2019   T3TOTAL 21 (L) 09/20/2017   Free T4  Date Value Ref Range Status  10/20/2019 1.83 (H) 0.61 - 1.12 ng/dL Final    Comment:    (NOTE) Biotin ingestion may interfere with free T4 tests. If the results are inconsistent with the TSH level, previous test results, or the clinical presentation, then consider biotin interference. If needed, order repeat testing after stopping biotin. Performed at [facility], [place] [address], [place], [place] [address]      Radiology    No results found.  Cardiac Studies   ECHO: 10/29/2019 1. Left ventricular ejection fraction, by estimation, is 35 to 40%. The  left ventricle has moderately decreased function. The left ventricle  demonstrates global hypokinesis. The left ventricular internal cavity size  was mildly dilated. Left ventricular  diastolic parameters are indeterminate.  2. Right ventricular systolic function was not well visualized. The right  ventricular size is not well visualized. There is moderately elevated  pulmonary artery systolic pressure.  3. The mitral valve is grossly normal. Mild mitral valve regurgitation.  No evidence of mitral stenosis.  4. The aortic valve was not well visualized. Aortic valve regurgitation  is not visualized.   Patient Profile     64 y.o. female with CHF and morbid obesity   Assessment & Plan    1.   Acute on chronic systolic congestive heart failure:  - I/O net -4.2 L, wt trending down very slowly, wt loss 0.2 lbs from yesterday - BUN/Cr trending up, Cr now 3.03 (Cr generally in the 2's except when she had kidney stones 09/2017, then peak was 8.97) - although EF is lower than previous, no ischemic eval is planned at this time>>med rx - Lasix gtt d/c'd by IM due to increased Cr, MD advise plan - w/ new blister and still w/ significant edema, will consult wound care, she may benefit from Unna boots - may need  R heart cath to clarify need for diuresis  2.  Chronic kidney disease: - Cr continues to increase w/ diuresis - consider Nephrology consult  3.  Moderate pulmonary hypertension.  - strong suspicion on OHS and OSA - follow, can get sleep study as outpt  4.  Morbid obesity.  - per IM, wt loss encouraged  5.  Hypothyroid: - TSH up, total T3 low, free T4 up - on home dose Synthroid 350 mcg - not sure if this contributes to volume overload - per IM      For questions or updates, please contact [place] Please consult [email or web address] for contact info under        Signed, Rosaria Ferries, PA-C  10/31/2019, 7:59 AM    Attending Note:   The patient was seen and examined.  Agree with assessment and plan as noted above.  Changes made to the above note as needed.  Patient seen and independently examined with Rosaria Ferries, PA .   We discussed all aspects of the encounter. I agree with the assessment and plan as stated above.  1   Leg edema :   Complicated by acute on chronic kidney disease EF 35-40 %. Contin medications Ok for Dc. She is ambulating in the halls without O2.   2.  Morbid obesity:    3.   Moderate pulmonary HTN:   I suspect OSA or obesity hypoventilaton   4.    I have spent a total of 40 minutes with patient reviewing hospital  notes , telemetry, EKGs, labs and examining patient as well as establishing an assessment and plan that was discussed with the patient. > 50% of time was spent in direct patient care.    Thayer Headings, Brooke Bonito., MD, [facility] 10/31/2019, 12:12 PM [address],  [place] Pager [phone]

## 2019-10-31 NOTE — Consult Note (Signed)
[facility] Nurse Consult Note: Reason for Consult:increased edema to lower legs  CHF exacerbation.  Will implement modified light compression.  Wound type: CHF exacerbation Pressure Injury POA: /NA Measurement:Blister to right dorsal foot:  1 cm serum filled Wound ZCU:[id number]  Drainage (amount, consistency, odor) none noted Periwound:edema to bilateral lower legs Dressing procedure/placement/frequency:Cleanse right foot blister with NS. Apply Xeroform to blister.  Wrap bilateral legs with kerlix from below toes to below knee and secure with self adherent coban.  Change Tuesday and Friday.  Will not follow at this time.  Please re-consult if needed.  Domenic Moras MSN, RN, FNP-BC CWON Wound, Ostomy, Continence Nurse Pager [phone]

## 2019-10-31 NOTE — TOC Transition Note (Signed)
Transition of Care [facility] - CM/SW Discharge Note   Patient Details  Name: Sheila Leach MRN: [id number] Date of Birth: [birth year]  [facility]) CM/SW Contact:  Dessa Phi, RN Phone Number: 10/31/2019, 2:06 PM   Clinical Narrative: Richardo Hanks of HHRN order. Spoke to patient about [facility] for wound care-patient states she has been taught how to do the care & feels comfortable on her own with wound care she pleasantly declines HHRN-MD notified. No further CM needs.     Final next level of care: Home/Self Care Barriers to Discharge: No Barriers Identified   Patient Goals and CMS Choice        Discharge Placement                       Discharge Plan and Services   Discharge Planning Services: CM Consult                      HH Arranged: Refused HH          Social Determinants of Health (SDOH) Interventions     Readmission Risk Interventions No flowsheet data found.

## 2019-10-31 NOTE — Progress Notes (Signed)
PHARMACY NOTE:  ANTIMICROBIAL RENAL DOSAGE ADJUSTMENT  Current antimicrobial regimen includes a mismatch between antimicrobial dosage and estimated renal function.  As per policy approved by the Pharmacy & Therapeutics and Medical Executive Committees, the antimicrobial dosage will be adjusted accordingly.  Current antimicrobial dosage:  Keflex 500mg  BID  Indication:   Renal Function:  Estimated Creatinine Clearance: 27.8 mL/min (A) (by C-G formula based on SCr of 3.03 mg/dL (H)). []      On intermittent HD, scheduled: []      On CRRT    Antimicrobial dosage has been changed to:  250mg  BID  Additional comments:   Thank you for allowing pharmacy to be a part of this patient's care.  Peggyann Juba, PharmD, BCPS [phone] 10/31/2019 8:04 AM

## 2019-10-31 NOTE — Discharge Summary (Addendum)
Physician Discharge Summary  Sheila Leach EXN:[id number] DOB: Jan 16, 1956 DOA: 10/27/2019  PCP: Shon Baton, MD  Admit date: 10/27/2019 Discharge date: 10/31/2019  Admitted From: Home Discharge disposition: Home with home health nurse   Code Status: Full Code  Diet Recommendation: Cardiac diet  Recommendations for Outpatient Follow-Up:   1. Follow-up with PCP as an outpatient 2. Follow-up with cardiology as an outpatient 3. Referral given for sleep apnea study  Discharge Diagnosis:   Principal Problem:   Acute on chronic diastolic CHF (congestive heart failure) (HCC) Active Problems:   HTN (hypertension)   Hypothyroidism   Type 2 diabetes mellitus with stage 3 chronic kidney disease (HCC)   Obesity, Class III, BMI 40-49.9 (morbid obesity) (HCC)   Acute on chronic diastolic (congestive) heart failure (HCC)  History of Present Illness / Brief narrative:  Ms.HallIs a 64 yo with hx of morbid obesity, HTN, HLD, CKD 3, hypothyroidism, nephrolithiasis HTN, morbid obesity, hypothyroidism. Patient presented to the ED on 3/26 with complaint of progressively worsening leg swelling associated with shortness of breath, now even at rest.  Started in January and has been progressive. >40 lbs weight gain 3 months. 3/19, patient had come to the ED with dyspnea. She was found to have lactic acidosis to 2.5 but no acute source of infection.  She was discharged on oral antibiotic and asked to follow-up with PCP.  Patient completed the course of oral antibiotic but did not feel better.  He went to her PCPs office on 3/26 and was subsequently sent to the ED for IV diuresis.   In the ED, patient was afebrile, tachycardic, blood pressure elevated to 170s.  Blood work showed creatinine elevated to 2.54, which is her baseline. Urinalysis with with some leukocytes and bacteria.  Chest x-ray showed vascular congestion, pleural effusions.  Her BNP was elevated to the 900s.  Patient was admitted to  hospitalist service for further evaluation management  Hospital Course:  Acute on chronic diastolic dysfunction CHF Essential hypertension -Patient presented for evaluation of shortness breath with exertion,lower extremity swelling and significant weight gain. -BNP was elevatedandchest x-ray showed stable vascular congestion and left pleural effusion -Cardiogram 3/27 shows reduced EF to 35 to 40% also elevated RVSP to 41. -Patient was started on IV Lasix drip at 5 mg/h. She has had more than 4 L of urine output since admission. -Cardiology consult appreciated. -Discharge home with medication adjustments.  Continue metoprolol succinate 50 mg daily, hydralazine 25 mg 3 times daily, Imdur 30 mg daily.  Also added torsemide 40 mg daily as well as additional torsemide 40 mg daily as needed dose based on weight gain, shortness of breath and pedal edema.  Acute respiratory failure with hypoxia -Patient required supplemental oxygen on admission. Currently requiring none while sitting upright -However, she says she gets dyspneic on lying flat. She may also have contributory sleep apnea.  But she has not had sleep study as an outpatient. -Now able to ambulate on the hallway without supplemental oxygen.  Elevated troponin -Troponin mildly elevated likely because of congestive heart failure. -Ultimately may need LHC and RHC, however limited by her poor creatinine clearance and allergy to contrast dye  CKD stage 4 -Creatinine at baseline less than 3.   Continue with a creatinine of 2.54.  Creatinine increased creatinine with diuresis.  3.03 today.  I think this is acceptable because of significant improvement in her shortness of breath.  With continued torsemide at home, creatinine may remain elevated or continues to rise.  She  needs to follow-up with cardiology as an outpatient for diuretic dose adjustment and further blood work. -Patient is afraid of going into dialysis.  He currently does not need  dialysis. -I also ordered for an ambulatory referral to nephrology clinic.  Right leg cellulitis, fluid-filled blister -Noted to have some redness in the right leg today. She also has 2 clear fluid blisters. -Started on Keflex to take for next 5 days.  Wound care nurse consult obtained as well.  Local care.  Hypothyroidism -Born with goiter. -had a precancerous lesion and required total thyroidectomy at the age of 78.  She has been on thyroid replacement since then.  At home patient is on high dose 350 mcg of Synthroid.  However, TSH elevated to 24 on 10/20/19.  She follows up with an endocrinologist as an outpatient.  History of bilateral nephrolithiasis -reports history of kidney stones that have required stent and nephrostomy tube placement approximately 2 years ago. -3/19, CT abdomen showed severe left renal atrophy with severe hydronephrosis due to a 4 mm calculus at left UPJ.  This finding was present in previous CT scan obtained on February 2019 as well. -Patient states she follows up with the urologist at [facility] u[facility].  Morbid obesity -Body mass index is 49.23 kg/m. Patient has been advised to make an attempt to improve diet and exercise patterns to aid in weight loss.  Probably has coexisting sleep apnea.  Needs sleep study as an outpatient.  Referral to sleep center given.  Mobility: Encourage ambulation Code Status: Full code   Subjective:  Seen and examined this morning.  Pleasant middle-aged Caucasian female, morbidly obese.  Looks older for her age. Sitting up in chair.  Not on supplemental oxygen.  Discharge Exam:   Vitals:   10/30/19 1356 10/30/19 2045 10/31/19 0404 10/31/19 0406  BP: 126/77 113/75 124/87   Pulse: 91 95 87   Resp: 18 20 18    Temp: 97.6 F (36.4 C) 97.9 F (36.6 C) 98 F (36.7 C)   TempSrc: Oral Oral Oral   SpO2: 98% 100% 98%   Weight:    (!) 142.5 kg  Height:        Body mass index is 49.2 kg/m.  General exam: Appears calm and  comfortable.  Morbidly obese.  Not in physical distress Skin: No rashes, lesions or ulcers. HEENT: Atraumatic, normocephalic, supple neck, no obvious bleeding Lungs: Clear to auscultation bilaterally CVS: Regular rate and rhythm, no murmur GI/Abd soft, nontender, distended from obesity, bowel sound present CNS: Alert, awake, oriented x3 Psychiatry: Mood appropriate Extremities: Continues to have pedal edema bilaterally, gradually improving.  Discharge Instructions:  Wound care: Local wound care to blister on the right leg Discharge Instructions    Ambulatory referral to Nephrology   Complete by: As directed    Ambulatory referral to Sleep Studies   Complete by: As directed    Increase activity slowly   Complete by: As directed      Follow-up Information    Shon Baton, MD Follow up.   Specialty: Internal Medicine Contact information: [place] [address] [phone]        Nahser, Wonda Cheng, MD .   Specialty: Cardiology Contact information: [address] [place] [address] [phone]          Allergies as of 10/31/2019      Reactions   Betadine [povidone Iodine] Anaphylaxis, Shortness Of Breath, Swelling   Face and lips   Contrast Media [iodinated Diagnostic  Agents] Anaphylaxis, Shortness Of Breath, Swelling   Face and lips   Other Swelling   Butterscotch candy, causes swelling of lips   Adhesive [tape] Rash      Medication List    STOP taking these medications   HYDROcodone-acetaminophen 5-325 MG tablet Commonly known as: NORCO/VICODIN   traMADol 50 MG tablet Commonly known as: ULTRAM     TAKE these medications   acetaminophen 500 MG tablet Commonly known as: TYLENOL Take 1,000 mg by mouth every 6 (six) hours as needed for moderate pain.   albuterol 108 (90 Base) MCG/ACT inhaler Commonly known as: VENTOLIN HFA Inhale 2 puffs into the lungs every 6 (six) hours as needed for wheezing or shortness of breath.    cephALEXin 250 MG capsule Commonly known as: KEFLEX Take 1 capsule (250 mg total) by mouth 2 (two) times daily for 5 days. What changed:   medication strength  how much to take  when to take this   cetirizine 10 MG tablet Commonly known as: ZYRTEC Take 10 mg by mouth in the morning.   fenofibrate 160 MG tablet Take 160 mg by mouth every morning.   hydrALAZINE 25 MG tablet Commonly known as: APRESOLINE Take 1 tablet (25 mg total) by mouth every 8 (eight) hours.   isosorbide mononitrate 30 MG 24 hr tablet Commonly known as: IMDUR Take 1 tablet (30 mg total) by mouth daily for 30 doses. Start taking on: November 01, 2019   metoprolol succinate 50 MG 24 hr tablet Commonly known as: TOPROL-XL Take 1 tablet (50 mg total) by mouth daily. Take with or immediately following a meal. Start taking on: November 01, 2019   multivitamin with minerals tablet Take 1 tablet by mouth daily.   ondansetron 4 MG disintegrating tablet Commonly known as: Zofran ODT Take 1 tablet (4 mg total) by mouth every 8 (eight) hours as needed for nausea or vomiting.   polyethylene glycol 17 g packet Commonly known as: MIRALAX / GLYCOLAX Take 17 g by mouth daily as needed for moderate constipation.   saccharomyces boulardii 250 MG capsule Commonly known as: FLORASTOR Take 1 capsule (250 mg total) by mouth 2 (two) times daily for 5 days.   sodium bicarbonate 650 MG tablet Take 1,300 mg by mouth daily.   Synthroid 175 MCG tablet Generic drug: levothyroxine Take 350 mcg by mouth daily before breakfast.   torsemide 20 MG tablet Commonly known as: DEMADEX Take 2 tablets (40 mg total) by mouth daily. Take extra 2 pills a day only if you have weight gain more than 3 pounds overnight or 5 pounds in a week or worsening shortness of breath or pedal edema       Time coordinating discharge: 35 minutes  The results of significant diagnostics from this hospitalization (including imaging, microbiology,  ancillary and laboratory) are listed below for reference.    Procedures and Diagnostic Studies:   DG Chest 2 View  Result Date: 10/27/2019 CLINICAL DATA:  Bilateral lower extremity edema, short of breath EXAM: CHEST - 2 VIEW COMPARISON:  10/20/2019 FINDINGS: Frontal and lateral views of the chest are obtained. The cardiac silhouette is enlarged but stable. There is a small left pleural effusion. Chronic central vascular congestion. No airspace disease or pneumothorax. No acute bony abnormalities. IMPRESSION: 1. Stable vascular congestion and left pleural effusion. Electronically Signed   By: Randa Ngo M.D.   On: 10/27/2019 11:25   ECHOCARDIOGRAM COMPLETE  Result Date: 10/28/2019    ECHOCARDIOGRAM REPORT   Patient  Name:   LUELLA GARDENHIRE Date of Exam: 10/28/2019 Medical Rec #:  [id number]  Height:       67.0 in Accession #:    [id number] Weight:       318.0 lb Date of Birth:  1955/08/29  BSA:          2.463 m Patient Age:    34 years   BP:           146/99 mmHg Patient Gender: F          HR:           99 bpm. Exam Location:  Inpatient Procedure: 2D Echo Indications:    428.31 CHF  History:        Patient has prior history of Echocardiogram examinations, most                 recent 09/21/2017. Risk Factors:Hypertension and Dyslipidemia.  Sonographer:    Jannett Celestine RDCS (AE) Referring Phys: [id number] TOCHUKWU AGBATA  Sonographer Comments: Patient is morbidly obese. Image acquisition challenging due to patient body habitus, Image acquisition challenging due to respiratory motion and restricted mobility. IMPRESSIONS  1. Left ventricular ejection fraction, by estimation, is 35 to 40%. The left ventricle has moderately decreased function. The left ventricle demonstrates global hypokinesis. The left ventricular internal cavity size was mildly dilated. Left ventricular diastolic parameters are indeterminate.  2. Right ventricular systolic function was not well visualized. The right ventricular size is not well  visualized. There is moderately elevated pulmonary artery systolic pressure.  3. The mitral valve is grossly normal. Mild mitral valve regurgitation. No evidence of mitral stenosis.  4. The aortic valve was not well visualized. Aortic valve regurgitation is not visualized. FINDINGS  Left Ventricle: Left ventricular ejection fraction, by estimation, is 35 to 40%. The left ventricle has moderately decreased function. The left ventricle demonstrates global hypokinesis. The left ventricular internal cavity size was mildly dilated. There is no left ventricular hypertrophy. Left ventricular diastolic parameters are indeterminate. Right Ventricle: The right ventricular size is not well visualized. Right vetricular wall thickness was not assessed. Right ventricular systolic function was not well visualized. There is moderately elevated pulmonary artery systolic pressure. The tricuspid regurgitant velocity is 2.57 m/s, and with an assumed right atrial pressure of 15 mmHg, the estimated right ventricular systolic pressure is 56.4 mmHg. Left Atrium: Left atrial size was not well visualized. Right Atrium: Right atrial size was not well visualized. Pericardium: There is no evidence of pericardial effusion. Mitral Valve: The mitral valve is grossly normal. Mild mitral valve regurgitation. No evidence of mitral valve stenosis. Tricuspid Valve: The tricuspid valve is grossly normal. Tricuspid valve regurgitation is trivial. No evidence of tricuspid stenosis. Aortic Valve: The aortic valve was not well visualized. Aortic valve regurgitation is not visualized. Pulmonic Valve: The pulmonic valve was normal in structure. Pulmonic valve regurgitation is not visualized. No evidence of pulmonic stenosis. Aorta: The aortic root and ascending aorta are structurally normal, with no evidence of dilitation. IAS/Shunts: The atrial septum is grossly normal.  LEFT VENTRICLE PLAX 2D LVIDd:         5.50 cm LVIDs:         4.10 cm LV PW:         1.30  cm LV IVS:        0.90 cm LVOT diam:     2.00 cm LV SV:         30 LV SV Index:   12 LVOT Area:  3.14 cm  LEFT ATRIUM         Index LA diam:    4.20 cm 1.71 cm/m  AORTIC VALVE LVOT Vmax:   64.00 cm/s LVOT Vmean:  46.800 cm/s LVOT VTI:    0.096 m  AORTA Ao Root diam: 2.25 cm MITRAL VALVE               TRICUSPID VALVE MV Area (PHT): 8.43 cm    TR Peak grad:   26.4 mmHg MV Decel Time: 90 msec     TR Vmax:        257.00 cm/s MV E velocity: 87.80 cm/s                            SHUNTS                            Systemic VTI:  0.10 m                            Systemic Diam: 2.00 cm Mertie Moores MD Electronically signed by Mertie Moores MD Signature Date/Time: 10/28/2019/3:11:25 PM    Final      Labs:   Basic Metabolic Panel: Recent Labs  Lab 10/27/19 1219 10/27/19 1219 10/28/19 0600 10/28/19 0600 10/29/19 0514 10/29/19 0514 10/30/19 0457 10/31/19 0542  NA 143  --  144  --  143  --  141 142  K 4.2   < > 4.6   < > 3.9   < > 4.0 3.8  CL 105  --  106  --  104  --  102 102  CO2 25  --  22  --  28  --  26 29  GLUCOSE 123*  --  122*  --  126*  --  108* 115*  BUN 34*  --  32*  --  37*  --  39* 41*  CREATININE 2.54*  --  2.60*  --  2.74*  --  2.84* 3.03*  CALCIUM 9.2  --  9.6  --  9.4  --  9.2 9.2   < > = values in this interval not displayed.   GFR Estimated Creatinine Clearance: 27.8 mL/min (A) (by C-G formula based on SCr of 3.03 mg/dL (H)). Liver Function Tests: Recent Labs  Lab 10/27/19 1219  AST 24  ALT 17  ALKPHOS 29*  BILITOT 0.9  PROT 6.9  ALBUMIN 4.4   No results for input(s): LIPASE, AMYLASE in the last 168 hours. No results for input(s): AMMONIA in the last 168 hours. Coagulation profile No results for input(s): INR, PROTIME in the last 168 hours.  CBC: Recent Labs  Lab 10/27/19 1219 10/30/19 2203  WBC 8.6 8.0  NEUTROABS 6.1  --   HGB 13.9 12.7  HCT 46.6* 43.0  MCV 89.1 90.1  PLT 283 266   Cardiac Enzymes: No results for input(s): CKTOTAL, CKMB,  CKMBINDEX, TROPONINI in the last 168 hours. BNP: Invalid input(s): POCBNP CBG: Recent Labs  Lab 10/31/19 0732 10/31/19 1204  GLUCAP 105* 93   D-Dimer No results for input(s): DDIMER in the last 72 hours. Hgb A1c No results for input(s): HGBA1C in the last 72 hours. Lipid Profile No results for input(s): CHOL, HDL, LDLCALC, TRIG, CHOLHDL, LDLDIRECT in the last 72 hours. Thyroid function studies No results for input(s): TSH, T4TOTAL, T3FREE, THYROIDAB in the last 72  hours.  Invalid input(s): FREET3 Anemia work up No results for input(s): VITAMINB12, FOLATE, FERRITIN, TIBC, IRON, RETICCTPCT in the last 72 hours. Microbiology Recent Results (from the past 240 hour(s))  Urine culture     Status: None   Collection Time: 10/27/19 10:48 AM   Specimen: Urine, Clean Catch  Result Value Ref Range Status   Specimen Description   Final    URINE, CLEAN CATCH Performed at [facility], [place] [address] [place], [place] [address]    Special Requests   Final    NONE Performed at [facility], [place] [address], [place], [place] [address]    Culture   Final    NO GROWTH Performed at [facility], [place] [address] [place], [place] [address]    Report Status 10/28/2019 FINAL  Final  SARS CORONAVIRUS 2 (TAT 6-24 HRS) Nasopharyngeal Nasopharyngeal Swab     Status: None   Collection Time: 10/27/19 12:39 PM   Specimen: Nasopharyngeal Swab  Result Value Ref Range Status   SARS Coronavirus 2 NEGATIVE NEGATIVE Final    Comment: (NOTE) SARS-CoV-2 target nucleic acids are NOT DETECTED. The SARS-CoV-2 RNA is generally detectable in upper and lower respiratory specimens during the acute phase of infection. Negative results do not preclude SARS-CoV-2 infection, do not rule out co-infections with other pathogens, and should not be used as the sole basis for treatment or other patient management decisions. Negative results must be combined with clinical  observations, patient history, and epidemiological information. The expected result is Negative. Fact Sheet for Patients: SugarRoll.be Fact Sheet for Healthcare Providers: https://www.woods-mathews.com/ This test is not yet approved or cleared by the [place] FDA and  has been authorized for detection and/or diagnosis of SARS-CoV-2 by FDA under an Emergency Use Authorization (EUA). This EUA will remain  in effect (meaning this test can be used) for the duration of the COVID-19 declaration under Section 56 4(b)(1) of the Act, 21 U.S.C. section 360bbb-3(b)(1), unless the authorization is terminated or revoked sooner. Performed at [facility], [place] [address], [place], [place] [address]     Please note: You were cared for by a hospitalist during your hospital stay. Once you are discharged, your primary care physician will handle any further medical issues. Please note that NO REFILLS for any discharge medications will be authorized once you are discharged, as it is imperative that you return to your primary care physician (or establish a relationship with a primary care physician if you do not have one) for your post hospital discharge needs so that they can reassess your need for medications and monitor your lab values.  Signed: Terrilee Croak  [facility] 10/31/2019, 2:42 PM

## 2019-11-16 ENCOUNTER — Telehealth: Payer: Self-pay | Admitting: Physician Assistant

## 2019-11-16 NOTE — Telephone Encounter (Signed)
LMTCB to inform pt of scheduled appt.

## 2019-11-16 NOTE — Telephone Encounter (Signed)
-----   Message from Lonn Georgia, PA-C sent at 11/15/2019  3:13 PM EDT ----- Can you make sure she knows about this appt? Thanks ----- Message ----- From: Candis Schatz Sent: 10/31/2019   4:43 PM EDT To: Lonn Georgia, PA-C  Scott 4/27 @12 :15 pm  ----- Message ----- From: Reola Mosher Sent: 10/31/2019   4:18 PM EDT To: Consuello Masse, PA-C  Ms. Rowton will need a follow up visit with APP in 3-4 weeks  Thanks

## 2019-11-27 ENCOUNTER — Encounter: Payer: Self-pay | Admitting: Physician Assistant

## 2019-11-27 DIAGNOSIS — N184 Chronic kidney disease, stage 4 (severe): Secondary | ICD-10-CM

## 2019-11-27 DIAGNOSIS — I5042 Chronic combined systolic (congestive) and diastolic (congestive) heart failure: Secondary | ICD-10-CM | POA: Insufficient documentation

## 2019-11-27 DIAGNOSIS — I420 Dilated cardiomyopathy: Secondary | ICD-10-CM | POA: Insufficient documentation

## 2019-11-27 DIAGNOSIS — I2729 Other secondary pulmonary hypertension: Secondary | ICD-10-CM

## 2019-11-27 DIAGNOSIS — I5022 Chronic systolic (congestive) heart failure: Secondary | ICD-10-CM

## 2019-11-27 HISTORY — DX: Chronic systolic (congestive) heart failure: I50.22

## 2019-11-27 HISTORY — DX: Chronic kidney disease, stage 4 (severe): N18.4

## 2019-11-27 HISTORY — DX: Other secondary pulmonary hypertension: I27.29

## 2019-11-27 NOTE — Progress Notes (Signed)
Cardiology Office Note:    Date:  11/28/2019   ID:  Sheila Leach, DOB 03-28-1956, MRN [id number]  PCP:  Shon Baton, MD  Cardiologist:  Mertie Moores, MD   Electrophysiologist:  None   Referring MD: Shon Baton, MD   Chief Complaint:  Hospitalization Follow-up (Admx with CHF)    Patient Profile:    Sheila Leach is a 64 y.o. female with:   Chronic Systolic CHF  Echocardiogram 10/2019: EF 35-40  Try to avoid cath due to CKD, dye allergy  Pulmonary hypertension   Echocardiogram 10/2019: RVSP 41.4 (probably related to obesity, CHF)  Hypertension   Hyperlipidemia   Chronic kidney disease stage 4  Morbid Obesity   Hypothyroidism, post surgical    Nephrolithiasis   Hx of endometrial CA  IV Dye Allergy   Prior CV studies: Echocardiogram 10/28/2019 EF 35-40, mild LVE, global HK, mod elevated RVSP (41.4), mild MR   Echocardiogram 09/21/17 Mild conc LVH, EF 50-55, no RWMA, trivial MR, trivial TR, PASP 33  History of Present Illness:    Sheila Leach was admitted 3/26-3/30 with acute hypoxic respiratory failure due to acute systolic CHF. TSH was 24.  Echocardiogram demonstrated EF 35-40 and RVSP 41.4.  She developed AKI on chronic kidney disease with IV diuresis.  Her hs-Troponin was mildly elevated without change.  It was decided to hold off on cardiac catheterization given lack of ischemic symptoms and no evidence of ACS.  Also, it was felt that cardiac catheterization would be risk given her chronic kidney disease and hx of IV dye allergy (anaphylaxis).  She was DCd on Metoprolol succinate, hydralazine, Isosorbide, Torsemide.      She returns for follow-up.  She is here alone.  She has done well since discharge from the hospital.  Her weight is down significantly (314 pounds>> 274 pounds).  She has not had chest discomfort.  She has not had significant shortness of breath.  She sleeps on an incline secondary to her back problems.  She has not had paroxysmal nocturnal dyspnea.  Her  lower extremity swelling is much improved.  She has not had syncope.  She is already established with nephrology.  Past Medical History:  Diagnosis Date  . Asthma    inhaler prn  . Bilateral ureteral calculi    10-21-2017 currently has retained right ureteral stent and left nephrostomy tube in place  . Cancer [facility])    uterine tumor  . Chronic kidney disease stage 4 11/27/2019  . Chronic systolic CHF 86/57/8469   Echocardiogram 10/2019: EF 35-40, mild LVE, global HK, mod elevated RVSP (41.4), mild MR    . Complication of anesthesia    trouble breathing after anesthesia didnt know had asthma at the time  . DDD (degenerative disc disease), lumbar    pt adenies at preop  . History of acute renal failure 09/19/2017   hospital admission --- hydronephrosis ,  ureteral stones  . 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 History of kidney stones   . Hx of radiation therapy 6/6/, 6/21, 6/24, 02/07/2009   post operative vaginal cuff branii therapy  . Hyperlipidemia   . Hypertension    10-21-2017  per pt was on lisinopril prior to  hospital admission 02/ 2019 she become hypotensive so lisinopril was discontinued  . Hypothyroidism, postsurgical FOLLOWED BY PCP   10-21-2017 per pt dx thyroid goiter at age 77 (age 79 s/p  partial thyroidectomy), age 35 thryoid greatly increased in size felt to be pre-cancerous (s/p  total thyroidectomy)  no radiation or chemo,  no recurrence  . Left sided sciatica    10-21-2017  per pt nerve injury during hysterectomy surgery 04/ 2010  ,  left leg weaker than right but gait issue  pt. denies  . Osteoporosis   . Other secondary pulmonary hypertension ([place])  11/27/2019   Echocardiogram 10/2019: RVSP 41.4 // likely multifactorial (obesity, acute CHF,?OSA)  . Personal history of colonic adenomas 01/24/2013  . Seasonal allergies     Current Medications: Current Meds  Medication Sig  . acetaminophen (TYLENOL) 500 MG tablet Take 1,000 mg by mouth every 6 (six) hours as needed for moderate pain.  Marland Kitchen albuterol (PROVENTIL HFA;VENTOLIN HFA) 108 (90 BASE) MCG/ACT inhaler Inhale 2 puffs into the lungs every 6 (six) hours as needed for wheezing or shortness of breath.  . cetirizine (ZYRTEC) 10 MG tablet Take 10 mg by mouth in the morning.   . fenofibrate 160 MG tablet Take 160 mg by mouth every morning.   . hydrALAZINE (APRESOLINE) 25 MG tablet Take 1 tablet (25 mg total) by mouth every 8 (eight) hours.  . isosorbide mononitrate (IMDUR) 30 MG 24 hr tablet Take 1 tablet (30 mg total) by mouth daily for 30 doses.  Marland Kitchen levothyroxine (SYNTHROID) 175 MCG tablet Take 350 mcg by mouth daily before breakfast.   . metoprolol succinate (TOPROL-XL) 50 MG 24 hr tablet Take 1 tablet (50 mg total) by mouth daily. Take with or immediately following a meal.  . Multiple Vitamins-Minerals (MULTIVITAMIN WITH MINERALS) tablet Take 1 tablet by mouth daily.  . ondansetron (ZOFRAN ODT) 4 MG disintegrating tablet Take 1 tablet (4 mg total) by mouth every 8 (eight) hours as needed for nausea or vomiting.  . polyethylene glycol (MIRALAX / GLYCOLAX) packet Take 17 g by mouth daily as needed for moderate constipation.  . sodium bicarbonate 650 MG tablet Take 1,300 mg by mouth daily.  Marland Kitchen torsemide (DEMADEX) 20 MG tablet Take 2 tablets (40 mg total) by mouth daily. Take extra 2 pills a day only if you have weight gain more than 3 pounds overnight or 5 pounds in a week or worsening shortness of breath or pedal edema     Allergies:   Betadine [povidone iodine], Contrast media [iodinated diagnostic agents], Other, and Adhesive [tape]   Social History   Tobacco Use  . Smoking status: Never  Smoker  . Smokeless tobacco: Never Used  Substance Use Topics  . Alcohol use: No  . Drug use: No     Family Hx: The patient's family history includes Breast cancer in an other family member; Diabetes in her brother and mother; Esophageal cancer in her brother; Heart disease in her father and mother; Kidney disease in her mother; Liver disease in her brother; Thyroid cancer in her sister. There is no history of Colon cancer, Rectal cancer, or Stomach cancer.  ROS   EKGs/Labs/Other Test Reviewed:    EKG:  EKG is   ordered today.  The ekg ordered today demonstrates normal sinus rhythm, heart rate 95, left axis deviation, first-degree AV block, PR interval 242 ms, nonspecific ST-T wave changes, QTC 442 ms  Recent Labs: 10/20/2019: TSH 24.305 10/27/2019: ALT 17; B Natriuretic Peptide 925.2 10/30/2019:  Hemoglobin 12.7; Platelets 266 10/31/2019: BUN 41; Creatinine, Ser 3.03; Potassium 3.8; Sodium 142   Recent Lipid Panel No results found for: CHOL, TRIG, HDL, CHOLHDL, LDLCALC, LDLDIRECT  Physical Exam:    VS:  BP (!) 120/50   Pulse 95   Ht 5\' 7"  (1.702 m)   Wt 274 lb (124.3 kg)   SpO2 97%   BMI 42.91 kg/m     Wt Readings from Last 3 Encounters:  11/28/19 274 lb (124.3 kg)  10/31/19 (!) 314 lb 1.6 oz (142.5 kg)  10/20/19 299 lb (135.6 kg)     Constitutional:      Appearance: Healthy appearance. Not in distress.  Neck:     Thyroid: No thyromegaly.     Vascular: JVD normal.  Pulmonary:     Breath sounds: No wheezing. No rales.  Cardiovascular:     Normal rate. Regular rhythm. Normal S1. Normal S2.     Murmurs: There is no murmur.  Edema:    Pretibial: bilateral trace edema of the pretibial area. Abdominal:     Palpations: Abdomen is soft.  Skin:    General: Skin is warm and dry.  Neurological:     General: No focal deficit present.     Mental Status: Alert and oriented to person, place and time.       ASSESSMENT & PLAN:    1. Chronic systolic CHF (congestive heart  failure) (HCC) EF 35-40 by echocardiogram March 2021.  NYHA II.  She is mainly limited by back issues.  Volume status overall stable.  Continue current therapy which includes torsemide, hydralazine, isosorbide, metoprolol succinate.  She is not on ACE/ARB/ARNI due to chronic kidney disease.  As she has had such significant weight Leach, I will repeat her BMET today.  If her renal function is worsening, we will reduce the dose of torsemide.  Also I will request her most recent BMET from primary care for comparison.  We did discuss the importance of daily weights and when to take extra Torsemide.    2. Dilated cardiomyopathy ([place]) Nonischemic versus ischemic.  She had no significant elevation in cardiac enzymes in the hospital and her echocardiogram showed global hypokinesis.  This raises the question of nonischemic cardiomyopathy.  We discussed how she is high risk for contrast-induced nephropathy given her chronic kidney disease should we proceed with cardiac catheterization.  She is currently not having any unstable symptoms.  Therefore, we will continue medical therapy.  I will arrange a follow-up limited echocardiogram to reassess her ejection fraction in 2 to 3 months prior to her next visit.  3. Other secondary pulmonary hypertension ([place]) This was likely related to left-sided heart failure.  4. CKD (chronic kidney disease) stage 4, GFR 15-29 ml/min (HCC) As noted, she is followed by nephrology.  Obtain follow-up BMP today.  5. Essential hypertension The patient's blood pressure is controlled on her current regimen.  Continue current therapy.     Dispo:  Return in about 3 months (around 02/27/2020) for Routine Follow Up, w/ Dr. Acie Fredrickson, in person.   Medication Adjustments/Labs and Tests Ordered: Current medicines are reviewed at length with the patient today.  Concerns regarding medicines are outlined above.  Tests Ordered: Orders Placed This Encounter  Procedures  . Basic metabolic panel    . EKG 12-Lead  . ECHOCARDIOGRAM LIMITED   Medication Changes: No orders of the defined types were placed in this encounter.   Signed, Richardson Dopp, PA-C  11/28/2019 1:11 PM    [facility] [place], [place], [place]  [address] Phone: [phone]; Fax: [phone]

## 2019-11-28 ENCOUNTER — Encounter: Payer: Self-pay | Admitting: Physician Assistant

## 2019-11-28 ENCOUNTER — Ambulatory Visit: Payer: Medicare Other | Admitting: Physician Assistant

## 2019-11-28 ENCOUNTER — Other Ambulatory Visit: Payer: Self-pay

## 2019-11-28 VITALS — BP 120/50 | HR 95 | Ht 67.0 in | Wt 274.0 lb

## 2019-11-28 DIAGNOSIS — I420 Dilated cardiomyopathy: Secondary | ICD-10-CM

## 2019-11-28 DIAGNOSIS — I5022 Chronic systolic (congestive) heart failure: Secondary | ICD-10-CM

## 2019-11-28 DIAGNOSIS — I2729 Other secondary pulmonary hypertension: Secondary | ICD-10-CM | POA: Diagnosis not present

## 2019-11-28 DIAGNOSIS — N184 Chronic kidney disease, stage 4 (severe): Secondary | ICD-10-CM

## 2019-11-28 DIAGNOSIS — I10 Essential (primary) hypertension: Secondary | ICD-10-CM

## 2019-11-28 NOTE — Patient Instructions (Signed)
Medication Instructions:  Your physician recommends that you continue on your current medications as directed. Please refer to the Current Medication list given to you today.  *If you need a refill on your cardiac medications before your next appointment, please call your pharmacy*   Lab Work: TODAY: BMET If you have labs (blood work) drawn today and your tests are completely normal, you will receive your results only by: Marland Kitchen MyChart Message (if you have MyChart) OR . A paper copy in the mail If you have any lab test that is abnormal or we need to change your treatment, we will call you to review the results.   Testing/Procedures: Your physician has requested that you have an echocardiogram. Echocardiography is a painless test that uses sound waves to create images of your heart. It provides your doctor with information about the size and shape of your heart and how well your heart's chambers and valves are working. This procedure takes approximately one hour. There are no restrictions for this procedure.  Follow-Up: At [facility], you and your health needs are our priority.  As part of our continuing mission to provide you with exceptional heart care, we have created designated Provider Care Teams.  These Care Teams include your primary Cardiologist (physician) and Advanced Practice Providers (APPs -  Physician Assistants and Nurse Practitioners) who all work together to provide you with the care you need, when you need it.  We recommend signing up for the patient portal called "MyChart".  Sign up information is provided on this After Visit Summary.  MyChart is used to connect with patients for Virtual Visits (Telemedicine).  Patients are able to view lab/test results, encounter notes, upcoming appointments, etc.  Non-urgent messages can be sent to your provider as well.   To learn more about what you can do with MyChart, go to [email or web address].    Your next appointment:   2  month(s)  The format for your next appointment:   In Person  Provider:   Mertie Moores, MD   Other Instructions   Echocardiogram An echocardiogram is a procedure that uses painless sound waves (ultrasound) to produce an image of the heart. Images from an echocardiogram can provide important information about:  Signs of coronary artery disease (CAD).  Aneurysm detection. An aneurysm is a weak or damaged part of an artery wall that bulges out from the normal force of blood pumping through the body.  Heart size and shape. Changes in the size or shape of the heart can be associated with certain conditions, including heart failure, aneurysm, and CAD.  Heart muscle function.  Heart valve function.  Signs of a past heart attack.  Fluid buildup around the heart.  Thickening of the heart muscle.  A tumor or infectious growth around the heart valves. Tell a health care provider about:  Any allergies you have.  All medicines you are taking, including vitamins, herbs, eye drops, creams, and over-the-counter medicines.  Any blood disorders you have.  Any surgeries you have had.  Any medical conditions you have.  Whether you are pregnant or may be pregnant. What are the risks? Generally, this is a safe procedure. However, problems may occur, including:  Allergic reaction to dye (contrast) that may be used during the procedure. What happens before the procedure? No specific preparation is needed. You may eat and drink normally. What happens during the procedure?   An IV tube may be inserted into one of your veins.  You may receive contrast through  this tube. A contrast is an injection that improves the quality of the pictures from your heart.  A gel will be applied to your chest.  A wand-like tool (transducer) will be moved over your chest. The gel will help to transmit the sound waves from the transducer.  The sound waves will harmlessly bounce off of your heart to  allow the heart images to be captured in real-time motion. The images will be recorded on a computer. The procedure may vary among health care providers and hospitals. What happens after the procedure?  You may return to your normal, everyday life, including diet, activities, and medicines, unless your health care provider tells you not to do that. Summary  An echocardiogram is a procedure that uses painless sound waves (ultrasound) to produce an image of the heart.  Images from an echocardiogram can provide important information about the size and shape of your heart, heart muscle function, heart valve function, and fluid buildup around your heart.  You do not need to do anything to prepare before this procedure. You may eat and drink normally.  After the echocardiogram is completed, you may return to your normal, everyday life, unless your health care provider tells you not to do that. This information is not intended to replace advice given to you by your health care provider. Make sure you discuss any questions you have with your health care provider. Document Revised: 11/10/2018 Document Reviewed: 08/22/2016 Elsevier Patient Education  [place].

## 2019-11-29 LAB — BASIC METABOLIC PANEL
BUN/Creatinine Ratio: 18 (ref 12–28)
BUN: 45 mg/dL — ABNORMAL HIGH (ref 8–27)
CO2: 25 mmol/L (ref 20–29)
Calcium: 10.1 mg/dL (ref 8.7–10.3)
Chloride: 102 mmol/L (ref 96–106)
Creatinine, Ser: 2.56 mg/dL — ABNORMAL HIGH (ref 0.57–1.00)
GFR calc Af Amer: 22 mL/min/{1.73_m2} — ABNORMAL LOW (ref >59)
GFR calc non Af Amer: 19 mL/min/{1.73_m2} — ABNORMAL LOW (ref >59)
Glucose: 132 mg/dL — ABNORMAL HIGH (ref 65–99)
Potassium: 4.5 mmol/L (ref 3.5–5.2)
Sodium: 145 mmol/L — ABNORMAL HIGH (ref 134–144)

## 2019-11-30 ENCOUNTER — Ambulatory Visit: Payer: Medicare Other | Attending: Internal Medicine

## 2019-11-30 DIAGNOSIS — Z23 Encounter for immunization: Secondary | ICD-10-CM

## 2019-11-30 NOTE — Progress Notes (Signed)
   Covid-19 Vaccination Clinic  Name:  Sheila Leach    MRN: [id number] DOB: [birth year]  11/30/2019  [name] was observed post Covid-19 immunization for 15 minutes without incident. She was provided with Vaccine Information Sheet and instruction to access the V-Safe system.   [name] was instructed to call 911 with any severe reactions post vaccine: Marland Kitchen Difficulty breathing  . Swelling of face and throat  . A fast heartbeat  . A bad rash all over body  . Dizziness and weakness   Immunizations Administered    Name Date Dose VIS Date Route   Pfizer COVID-19 Vaccine 11/30/2019  1:42 PM 0.3 mL 09/27/2018 Intramuscular   Manufacturer: [place]   Lot: [id number]   [place]: [id number]

## 2019-12-06 ENCOUNTER — Other Ambulatory Visit: Payer: Self-pay

## 2019-12-06 MED ORDER — TORSEMIDE 20 MG PO TABS: 40.0000 mg | ORAL_TABLET | Freq: Every day | ORAL | 3 refills | Status: DC

## 2019-12-06 MED ORDER — METOPROLOL SUCCINATE ER 50 MG PO TB24: 50.0000 mg | ORAL_TABLET | Freq: Every day | ORAL | 3 refills | Status: DC

## 2019-12-06 MED ORDER — ISOSORBIDE MONONITRATE ER 30 MG PO TB24: 30.0000 mg | ORAL_TABLET | Freq: Every day | ORAL | 3 refills | Status: DC

## 2019-12-06 NOTE — Telephone Encounter (Signed)
Pt's medications were sent to pt's pharmacy as requested. Confirmation received.  

## 2019-12-19 ENCOUNTER — Other Ambulatory Visit: Payer: Self-pay

## 2019-12-19 ENCOUNTER — Ambulatory Visit (HOSPITAL_COMMUNITY): Payer: Medicare Other | Attending: Cardiovascular Disease

## 2019-12-19 DIAGNOSIS — I5022 Chronic systolic (congestive) heart failure: Secondary | ICD-10-CM

## 2019-12-21 ENCOUNTER — Encounter: Payer: Self-pay | Admitting: Physician Assistant

## 2019-12-25 ENCOUNTER — Ambulatory Visit: Payer: Medicare Other | Attending: Internal Medicine

## 2019-12-25 DIAGNOSIS — Z23 Encounter for immunization: Secondary | ICD-10-CM

## 2019-12-25 NOTE — Progress Notes (Signed)
   Covid-19 Vaccination Clinic  Name:  Sheila Leach    MRN: [id number] DOB: September 16, 1955  12/25/2019  [name] was observed post Covid-19 immunization for 15 minutes without incident. She was provided with Vaccine Information Sheet and instruction to access the V-Safe system.   [name] was instructed to call 911 with any severe reactions post vaccine: Marland Kitchen Difficulty breathing  . Swelling of face and throat  . A fast heartbeat  . A bad rash all over body  . Dizziness and weakness   Immunizations Administered    Name Date Dose VIS Date Route   Pfizer COVID-19 Vaccine 12/25/2019  1:32 PM 0.3 mL 09/27/2018 Intramuscular   Manufacturer: [place]   Lot: [id number]   [place]: [address]-7

## 2019-12-29 ENCOUNTER — Other Ambulatory Visit: Payer: Self-pay

## 2019-12-29 DIAGNOSIS — J90 Pleural effusion, not elsewhere classified: Secondary | ICD-10-CM

## 2020-01-03 ENCOUNTER — Ambulatory Visit
Admission: RE | Admit: 2020-01-03 | Discharge: 2020-01-03 | Disposition: A | Discharge status: Home / Self Care | Payer: Medicare Other | Source: Ambulatory Visit | Attending: Physician Assistant | Admitting: Physician Assistant

## 2020-01-03 DIAGNOSIS — J90 Pleural effusion, not elsewhere classified: Secondary | ICD-10-CM

## 2020-02-14 ENCOUNTER — Ambulatory Visit: Payer: Medicare Other | Admitting: Cardiovascular Disease

## 2020-02-14 ENCOUNTER — Other Ambulatory Visit: Payer: Self-pay

## 2020-02-14 ENCOUNTER — Encounter: Payer: Self-pay | Admitting: Cardiovascular Disease

## 2020-02-14 VITALS — BP 112/76 | HR 84 | Ht 67.5 in | Wt 268.0 lb

## 2020-02-14 DIAGNOSIS — I5042 Chronic combined systolic (congestive) and diastolic (congestive) heart failure: Secondary | ICD-10-CM

## 2020-02-14 NOTE — Progress Notes (Signed)
Cardiology Office Note:    Date:  02/14/2020   ID:  Sheila Leach, DOB Jun 05, 1956, MRN [id number]  PCP:  Sheila Baton, MD  [facility] Cardiologist:  Sheila Moores, MD  [place] Electrophysiologist:  None   Referring MD: Sheila Baton, MD   Chief Complaint  Patient presents with  . Congestive Heart Failure      History of Present Illness:    Sheila Leach is a 64 y.o. female with a hx of hypertension, hyperlipidemia, obesity, chronic kidney disease who I met in the hospital in March, 2021 when she presented with congestive heart failure.  Echocardiogram revealed moderate LV dysfunction with EF of 35 to 40%.  We were not able to determine diastolic parameters.  She had moderately elevated pulmonary artery pressures.  Mild mitral regurgitation.  She was seen in our office in April by Sheila Dopp, PA.  She had lost quite a bit of weight.  She gone from 314 pounds down to 274 pounds.  She is not had any any significant shortness of breath.  She denies any syncope.  She has been seeing nephrology.  Since that time she is done well.  Her weight has continued to decrease.  She is not had any episodes of shortness of breath.  No CP  She is on hydralazine 25 3 times a day, isosorbide 30 mg a day, Toprol-XL 50 mg a day, torsemide 40 mg a day. She watches her salt Is now waking , Creatinine is 2.56.  We have not tried her on ARB, entresto  or spironolactone.  Echocardiogram in May revealed no change in LV function.  Revealed a left pleural effusion.  Chest  x-ray from January 03, 2020 reveals no significant pleural effusion.  We are avoiding ischemic work-up, especially heart catheterization because of her elevated creatinine.  She does not want to go on dialysis.  I think that we will wait and consider doing a heart catheterization if at some point she goes on dialysis.  She is currently not having any episodes of angina.  Past Medical History:  Diagnosis Date  . Asthma    inhaler prn  .  Bilateral ureteral calculi    10-21-2017 currently has retained right ureteral stent and left nephrostomy tube in place  . Cancer [facility])    uterine tumor  . Chronic kidney disease stage 4 11/27/2019  . Chronic systolic CHF 36/46/8032   Echocardiogram 10/2019: EF 35-40, mild LVE, global HK, mod elevated RVSP (41.4), mild MR   // Echo 12/2019: EF 35-40, global HK, GRII DD, GLS -12.2%, moderate left pleural effusion   . Complication of anesthesia    trouble breathing after anesthesia didnt know had asthma at the time  . DDD (degenerative disc disease), lumbar    pt adenies at preop  . History of acute renal failure 09/19/2017   hospital admission --- hydronephrosis ,  ureteral stones  . 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 History of kidney stones   . Hx of radiation therapy 6/6/, 6/21, 6/24, 02/07/2009   post operative vaginal cuff branii therapy  . Hyperlipidemia   . Hypertension    10-21-2017  per pt was on lisinopril prior to hospital admission  02/ 2019 she become hypotensive so lisinopril was discontinued  . Hypothyroidism, postsurgical FOLLOWED BY PCP   10-21-2017 per pt dx thyroid goiter at age 17 (age 60 s/p  partial thyroidectomy), age 39 thryoid greatly increased in size felt to be pre-cancerous (s/p  total thyroidectomy)  no radiation or chemo,  no recurrence  . Left sided sciatica    10-21-2017  per pt nerve injury during hysterectomy surgery 04/ 2010  ,  left leg weaker than right but gait issue  pt. denies  . Osteoporosis   . Other secondary pulmonary hypertension ([place]) 11/27/2019   Echocardiogram 10/2019: RVSP 41.4 // likely multifactorial (obesity, acute CHF,?OSA)   . Personal history of colonic adenomas 01/24/2013  . Seasonal allergies     Past Surgical History:  Procedure Laterality Date  . CESAREAN SECTION  1988  . CYSTOSCOPY W/ URETERAL STENT PLACEMENT Bilateral 09/19/2017   Procedure: CYSTOSCOPY WITH BILATERAL RETROGRADE PYELOGRAM/RIGHT URETERAL STENT PLACEMENT;  Surgeon: Cleon Gustin, MD;  Location: [facility];  Service: Urology;  Laterality: Bilateral;  . CYSTOSCOPY WITH RETROGRADE PYELOGRAM, URETEROSCOPY AND STENT PLACEMENT Bilateral 10/29/2017   Procedure: CYSTOSCOPY WITH RETROGRADE PYELOGRAM, URETEROSCOPY AND STENT PLACEMENT, REMOVAL LEFT NEPHROSTOMY TUBE;  Surgeon: Cleon Gustin, MD;  Location[facility];  Service: Urology;  Laterality: Bilateral;  . DILATION AND CURETTAGE OF UTERUS  1996   w/ resectoscopic resection fibroids and Dx Laparoscopy  . HOLMIUM LASER APPLICATION Bilateral 3/32/9518   Procedure: HOLMIUM LASER APPLICATION;  Surgeon: Cleon Gustin, MD;  Location: [facility];  Service: Urology;  Laterality: Bilateral;  . IR NEPHROSTOMY PLACEMENT LEFT  09/20/2017  . PARATHYROIDECTOMY Left 05/12/2019   Procedure: LEFT SUPERIOR PARATHYROIDECTOMY;  Surgeon: Armandina Gemma, MD;  Location: [facility];  Service: General;  Laterality: Left;  . THYROIDECTOMY, PARTIAL  age 73  . TONSILLECTOMY  age 42  . TOTAL ABDOMINAL HYSTERECTOMY W/ BILATERAL SALPINGOOPHORECTOMY  11-27-2008   dr Sheila Leach  [facility]   w/ Bilateral Pelvic Lymph Node Dissection's   . TOTAL THYROIDECTOMY  age 1   pre -cancerous  . TRANSTHORACIC ECHOCARDIOGRAM  09/21/2017   mild concentric LVH, ef 50-55%/  trivial MR and TR    Current Medications: Current Meds  Medication Sig  . acetaminophen (TYLENOL) 500 MG tablet Take 1,000 mg by mouth every 6 (six) hours as needed for moderate pain.  Marland Kitchen albuterol (PROVENTIL HFA;VENTOLIN HFA) 108 (90 BASE) MCG/ACT inhaler Inhale 2 puffs into the lungs every 6 (six) hours as needed for wheezing or shortness  of breath.  . cetirizine (ZYRTEC) 10 MG tablet Take 10 mg by mouth in the morning.   . fenofibrate 160 MG tablet Take 160 mg by mouth every morning.   . hydrALAZINE (APRESOLINE) 25 MG tablet Take 1 tablet (25 mg total) by mouth every 8 (eight) hours.  . isosorbide mononitrate (IMDUR) 30 MG 24 hr tablet Take 1 tablet (30 mg total) by mouth daily.  Marland Kitchen levothyroxine (SYNTHROID) 175 MCG tablet Take 350 mcg by mouth daily before breakfast.   . metoprolol succinate (TOPROL-XL) 50 MG 24 hr tablet Take 1 tablet (50 mg total) by mouth daily. Take with or immediately following a meal.  . Multiple Vitamins-Minerals (MULTIVITAMIN WITH MINERALS) tablet Take 1 tablet by mouth daily.  . ondansetron (ZOFRAN ODT) 4 MG disintegrating tablet Take 1 tablet (4 mg total) by mouth every 8 (eight) hours as needed for nausea or vomiting.  . polyethylene glycol (MIRALAX / GLYCOLAX) packet Take 17 g by mouth daily  as needed for moderate constipation.  . sodium bicarbonate 650 MG tablet Take 1,300 mg by mouth daily.  Marland Kitchen torsemide (DEMADEX) 20 MG tablet Take 2 tablets (40 mg total) by mouth daily. Take extra 2 pills a day only if you have weight gain more than 3 pounds overnight or 5 pounds in a week or worsening shortness of breath or pedal edema     Allergies:   Betadine [povidone iodine], Contrast media [iodinated diagnostic agents], Other, and Adhesive [tape]   Social History   Socioeconomic History  . Marital status: Divorced    Spouse name: Not on file  . Number of children: Not on file  . Years of education: Not on file  . Highest education level: Not on file  Occupational History  . Not on file  Tobacco Use  . Smoking status: Never Smoker  . Smokeless tobacco: Never Used  Vaping Use  . Vaping Use: Never used  Substance and Sexual Activity  . Alcohol use: No  . Drug use: No  . Sexual activity: Not Currently  Other Topics Concern  . Not on file  Social History Narrative  . Not on file   Social  Determinants of Health   Financial Resource Strain:   . Difficulty of Paying Living Expenses:   Food Insecurity:   . Worried About Charity fundraiser in the Last Year:   . Arboriculturist in the Last Year:   Transportation Needs:   . Film/video editor (Medical):   Marland Kitchen Lack of Transportation (Non-Medical):   Physical Activity:   . Days of Exercise per Week:   . Minutes of Exercise per Session:   Stress:   . Feeling of Stress :   Social Connections:   . Frequency of Communication with Friends and Family:   . Frequency of Social Gatherings with Friends and Family:   . Attends Religious Services:   . Active Member of Clubs or Organizations:   . Attends Archivist Meetings:   Marland Kitchen Marital Status:      Family History: The patient's family history includes Breast cancer in an other family member; Diabetes in her brother and mother; Esophageal cancer in her brother; Heart disease in her father and mother; Kidney disease in her mother; Liver disease in her brother; Thyroid cancer in her sister. There is no history of Colon cancer, Rectal cancer, or Stomach cancer.  ROS:   Please see the history of present illness.     All other systems reviewed and are negative.  EKGs/Labs/Other Studies Reviewed:    The following studies were reviewed today:   EKG:     Recent Labs: 10/20/2019: TSH 24.305 10/27/2019: ALT 17; B Natriuretic Peptide 925.2 10/30/2019: Hemoglobin 12.7; Platelets 266 11/28/2019: BUN 45; Creatinine, Ser 2.56; Potassium 4.5; Sodium 145  Recent Lipid Panel No results found for: CHOL, TRIG, HDL, CHOLHDL, VLDL, LDLCALC, LDLDIRECT  Physical Exam:    VS:  BP 112/76   Pulse 84   Ht 5' 7.5" (1.715 m)   Wt 268 lb (121.6 kg)   SpO2 96%   BMI 41.36 kg/m     Wt Readings from Last 3 Encounters:  02/14/20 268 lb (121.6 kg)  11/28/19 274 lb (124.3 kg)  10/31/19 (!) 314 lb 1.6 oz (142.5 kg)     GEN:  middel age, modertely obese female, NAD  HEENT: Normal NECK:  No JVD; No carotid bruits LYMPHATICS: No lymphadenopathy CARDIAC: RRR, no murmurs, rubs, gallops RESPIRATORY:  Clear to  auscultation without rales, wheezing or rhonchi  ABDOMEN: Soft, non-tender, non-distended MUSCULOSKELETAL:  No edema; No deformity  SKIN: Warm and dry NEUROLOGIC:  Alert and oriented x 3 PSYCHIATRIC:  Normal affect   ASSESSMENT:    1. Chronic combined systolic and diastolic heart failure (HCC)    PLAN:    In order of problems listed above:  1. Chronic combined systolic and diastolic congestive heart failure: Neoma Laming overall seems to be doing well.  We will continue the Toprol-XL, hydralazine, isosorbide.  Her creatinine is 2.45 so we will would we would not be able to initiate ARB, Entresto, or spironolactone.  In addition, I also think we should hold off on any ischemic evaluation.  Cardiac catheterization would have a very high risk of causing renal failure and she is not ready to start dialysis.  If at some point she develops renal failure and goes on dialysis then I think it would be appropriate for [place] to proceed with heart catheterization at that time.  Fortunately, she is not having any episodes of angina so the question of ischemic disease can wait for now.  2.  Hypertension: Continue current medications.  We will have her return in 6 months to see our APP P.  We will check a basic metabolic profile at that time.  Medication Adjustments/Labs and Tests Ordered: Current medicines are reviewed at length with the patient today.  Concerns regarding medicines are outlined above.  Orders Placed This Encounter  Procedures  . Basic metabolic panel   No orders of the defined types were placed in this encounter.   Patient Instructions  Medication Instructions:  No changes *If you need a refill on your cardiac medications before your next appointment, please call your pharmacy*   Lab Work: bmet in 6 months at your next office visit     Testing/Procedures: none   Follow-Up: At [facility], you and your health needs are our priority.  As part of our continuing mission to provide you with exceptional heart care, we have created designated Provider Care Teams.  These Care Teams include your primary Cardiologist (physician) and Advanced Practice Providers (APPs -  Physician Assistants and Nurse Practitioners) who all work together to provide you with the care you need, when you need it.  Your next appointment:   6 month(s)  The format for your next appointment:   In Person  Provider:   You may see Sheila Dopp, PA-C, one of the following Advanced Practice Providers on your designated Care Team:     Other Instructions      Signed, Sheila Moores, MD  02/14/2020 1:45 PM    [place]

## 2020-02-14 NOTE — Patient Instructions (Signed)
Medication Instructions:  No changes *If you need a refill on your cardiac medications before your next appointment, please call your pharmacy*   Lab Work: bmet in 6 months at your next office visit    Testing/Procedures: none   Follow-Up: At [facility], you and your health needs are our priority.  As part of our continuing mission to provide you with exceptional heart care, we have created designated Provider Care Teams.  These Care Teams include your primary Cardiologist (physician) and Advanced Practice Providers (APPs -  Physician Assistants and Nurse Practitioners) who all work together to provide you with the care you need, when you need it.  Your next appointment:   6 month(s)  The format for your next appointment:   In Person  Provider:   You may see Richardson Dopp, PA-C, one of the following Advanced Practice Providers on your designated Care Team:     Other Instructions

## 2020-05-01 ENCOUNTER — Other Ambulatory Visit: Payer: Self-pay | Admitting: Internal Medicine

## 2020-05-01 DIAGNOSIS — Z1231 Encounter for screening mammogram for malignant neoplasm of breast: Secondary | ICD-10-CM

## 2020-05-22 ENCOUNTER — Other Ambulatory Visit: Payer: Self-pay

## 2020-05-22 ENCOUNTER — Ambulatory Visit
Admission: RE | Admit: 2020-05-22 | Discharge: 2020-05-22 | Disposition: A | Discharge status: Home / Self Care | Payer: Medicare Other | Source: Ambulatory Visit | Attending: Internal Medicine | Admitting: Internal Medicine

## 2020-05-22 DIAGNOSIS — Z1231 Encounter for screening mammogram for malignant neoplasm of breast: Secondary | ICD-10-CM

## 2020-11-12 ENCOUNTER — Other Ambulatory Visit: Payer: Self-pay | Admitting: Cardiovascular Disease

## 2020-11-12 MED ORDER — TORSEMIDE 20 MG PO TABS
40.0000 mg | ORAL_TABLET | Freq: Every day | ORAL | 0 refills | Status: DC
Start: 2020-11-12 — End: 2021-02-20

## 2021-02-20 ENCOUNTER — Other Ambulatory Visit: Payer: Self-pay | Admitting: *Deleted

## 2021-02-20 ENCOUNTER — Other Ambulatory Visit: Payer: Self-pay | Admitting: Cardiovascular Disease

## 2021-02-20 MED ORDER — TORSEMIDE 20 MG PO TABS
40.0000 mg | ORAL_TABLET | Freq: Every day | ORAL | 0 refills | Status: DC
Start: 2021-02-20 — End: 2021-03-28

## 2021-03-27 NOTE — Progress Notes (Signed)
Cardiology Office Note:    Date:  03/28/2021   ID:  Anja Neuzil, DOB 08/25/1955, MRN [id number]  PCP:  Shon Baton, MD   [facility] Providers Cardiologist:  Mertie Moores, MD Cardiology APP:  Sharmon Revere      Referring MD: Shon Baton, MD   Chief Complaint:  Follow-up (CHF)    Patient Profile:    Sheila Leach is a 65 y.o. female with:  (HFrEF) heart failure with reduced ejection fraction  Echocardiogram 10/2019: EF 35-40 Echocardiogram 5/21: EF 35-40 Try to avoid cath due to CKD, dye allergy Pulmonary hypertension  Echocardiogram 10/2019: RVSP 41.4 (probably related to obesity, CHF) Echocardiogram 5/21: RVSP 34.6 Hypertension  Hyperlipidemia  Chronic kidney disease stage 4 Morbid Obesity  Hypothyroidism, post surgical   Nephrolithiasis  Hx of endometrial CA IV Dye Allergy    Prior CV studies: Echocardiogram 12/09/19 EF 35-40, GRII DD, GLS -12.2, moderate pleural effusion, RVSP 34.6  Echocardiogram 10/28/2019 EF 35-40, mild LVE, global HK, mod elevated RVSP (41.4), mild MR    Echocardiogram 09/21/17 Mild conc LVH, EF 50-55, no RWMA, trivial MR, trivial TR, PASP 33   History of Present Illness: Ms. Abascal was admitted 10/2019 with acute hypoxic respiratory failure due to acute (HFrEF) heart failure with reduced ejection fraction. TSH was 24.  Echocardiogram demonstrated EF 35-40 and RVSP 41.4.  She developed AKI on chronic kidney disease with IV diuresis.  Her hs-Troponin was mildly elevated without change.  It was decided to hold off on cardiac catheterization given lack of ischemic symptoms, no evidence of ACS, chronic kidney disease and hx of IV dye allergy (anaphylaxis).      A follow-up echocardiogram was obtained in 5/21.  This demonstrated no change in LV function with EF 35-40.  She was last seen in clinic by Dr. Acie Fredrickson in 7/21.  She was continued on medical therapy.  Recommendation was to continue to avoid cardiac catheterization unless she develops ischemic  symptoms or goes on dialysis.  She returns for follow-up.  She is here alone.  Overall, she has been doing well.  Her PCP recently placed her on empagliflozin.  Her blood sugars have decreased significantly since.  She has not had chest discomfort, significant shortness of breath, orthopnea, leg edema, syncope.    Past Medical History:  Diagnosis Date   Asthma    inhaler prn   Bilateral ureteral calculi    10-21-2017 currently has retained right ureteral stent and left nephrostomy tube in place   Cancer [facility])    uterine tumor   Chronic kidney disease stage 4 50/35/4656   Chronic systolic CHF 81/27/5170   Echocardiogram 10/2019: EF 35-40, mild LVE, global HK, mod elevated RVSP (41.4), mild MR   // Echo 12/2019: EF 35-40, global HK, GRII DD, GLS -12.2%, moderate left pleural effusion    Complication of anesthesia    trouble breathing after anesthesia didnt know had asthma at the time   DDD (degenerative disc disease), lumbar    pt adenies at preop   History of acute renal failure 09/19/2017   hospital admission --- hydronephrosis ,  ureteral stones   History of endometrial cancer previous oncolgoist-- dr Alycia Rossetti--- pt was released , no recurrence   2010  dx endometrial cancer--- 11-27-2008 s/p  TAH w/ BSO with bilateral pelvic node dissection's--- Stage IB, 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 , negative nodes, no chemo , completed post operative vaginal cuff brachii therapy 07/ 2010   History of kidney stones  Hx of radiation therapy 6/6/, 6/21, 6/24, 02/07/2009   post operative vaginal cuff branii therapy   Hyperlipidemia    Hypertension    10-21-2017  per pt was on lisinopril prior to hospital admission 02/ 2019 she become hypotensive so lisinopril was discontinued   Hypothyroidism, postsurgical FOLLOWED BY PCP   10-21-2017 per pt dx thyroid goiter at age 52 (age 55 s/p  partial thyroidectomy), age 7 thryoid greatly increased in size felt to be pre-cancerous (s/p   total thyroidectomy)  no radiation or chemo,  no recurrence   Left sided sciatica    10-21-2017  per pt nerve injury during hysterectomy surgery 04/ 2010  ,  left leg weaker than right but gait issue  pt. denies   Osteoporosis    Other secondary pulmonary hypertension ([place]) 11/27/2019   Echocardiogram 10/2019: RVSP 41.4 // likely multifactorial (obesity, acute CHF,?OSA)   Personal history of colonic adenomas 01/24/2013   Seasonal allergies     Current Medications: Current Meds  Medication Sig   acetaminophen (TYLENOL) 500 MG tablet Take 1,000 mg by mouth every 6 (six) hours as needed for moderate pain.   albuterol (PROVENTIL HFA;VENTOLIN HFA) 108 (90 BASE) MCG/ACT inhaler Inhale 2 puffs into the lungs every 6 (six) hours as needed for wheezing or shortness of breath.   cetirizine (ZYRTEC) 10 MG tablet Take 10 mg by mouth in the morning.    empagliflozin (JARDIANCE) 10 MG TABS tablet Take 1/2 tablet by mouth every other day for one month then stop taking at the end of September   fenofibrate 160 MG tablet Take 160 mg by mouth every morning.    hydrALAZINE (APRESOLINE) 25 MG tablet Take 1 tablet (25 mg total) by mouth every 8 (eight) hours.   isosorbide mononitrate (IMDUR) 30 MG 24 hr tablet Take 1 tablet by mouth once daily   levothyroxine (SYNTHROID) 175 MCG tablet Take 350 mcg by mouth daily before breakfast.    metoprolol succinate (TOPROL-XL) 50 MG 24 hr tablet TAKE 1 TABLET BY MOUTH ONCE DAILY WITH OR IMMEDIATELY FOLLOWING A MEAL FOOD   Multiple Vitamins-Minerals (MULTIVITAMIN WITH MINERALS) tablet Take 1 tablet by mouth daily.   ondansetron (ZOFRAN ODT) 4 MG disintegrating tablet Take 1 tablet (4 mg total) by mouth every 8 (eight) hours as needed for nausea or vomiting.   polyethylene glycol (MIRALAX / GLYCOLAX) packet Take 17 g by mouth daily as needed for moderate constipation.   sodium bicarbonate 650 MG tablet Take 1,300 mg by mouth daily.   torsemide (DEMADEX) 20 MG tablet Take 2  tablets (40 mg total) by mouth daily. Take extra 2 pills a day only if you have weight gain more than 3 pounds overnight or 5 pounds in a week or worsening shortness of breath or pedal edema...PT IS OVERDUE FOR AN APPT, PLEASE HAVE HER CALL TO SCHEDULE FOR FUTURE REFILLS.Marland Kitchen 1ST ATTEMPT.     Allergies:   Betadine [povidone iodine], Contrast media [iodinated diagnostic agents], Other, Ibuprofen, Iodine, and Adhesive [tape]   Social History   Tobacco Use   Smoking status: Never   Smokeless tobacco: Never  Vaping Use   Vaping Use: Never used  Substance Use Topics   Alcohol use: No   Drug use: No     Family Hx: The patient's family history includes Breast cancer in an other family member; Diabetes in her brother and mother; Esophageal cancer in her brother; Heart disease in her father and mother; Kidney disease in her mother; Liver disease in  her brother; Thyroid cancer in her sister. There is no history of Colon cancer, Rectal cancer, or Stomach cancer.  ROS - See HPI  EKGs/Labs/Other Test Reviewed:    EKG:  EKG is ordered today.  The ekg ordered today demonstrates sinus tachycardia, HR 107, left axis deviation, poor R wave progression, QTC is 464, nonspecific ST-T wave changes  Recent Labs: No results found for requested labs within last 8760 hours.   Recent Lipid Panel No results found for: CHOL, TRIG, HDL, LDLCALC, LDLDIRECT  Labs obtained through [facility] Tool - personally reviewed and interpreted: 03/10/2021: Total cholesterol 272, HDL 36, LDL 121, triglycerides 574, A1c 8.9, K+ on 4.3, ALT 24 07/04/2020: Hgb 12.9, creatinine 2.51   Risk Assessment/Calculations:          Physical Exam:    VS:  BP 118/80   Pulse (!) 107   Ht 5\' 8"  (1.727 m)   Wt 287 lb 6.4 oz (130.4 kg)   SpO2 96%   BMI 43.70 kg/m     Wt Readings from Last 3 Encounters:  03/28/21 287 lb 6.4 oz (130.4 kg)  02/14/20 268 lb (121.6 kg)  11/28/19 274 lb (124.3 kg)     Constitutional:      Appearance:  Healthy appearance. Not in distress.  Neck:     Vascular: No JVR.  Pulmonary:     Effort: Pulmonary effort is normal.     Breath sounds: No wheezing. No rales.  Cardiovascular:     Normal rate. Regular rhythm. Normal S1. Normal S2.      Murmurs: There is no murmur.  Edema:    Peripheral edema absent.  Abdominal:     Palpations: Abdomen is soft.  Musculoskeletal:     Cervical back: Neck supple. Skin:    General: Skin is warm and dry.  Neurological:     Mental Status: Alert and oriented to person, place and time.     Cranial Nerves: Cranial nerves are intact.        ASSESSMENT & PLAN:    1. HFrEF (heart failure with reduced ejection fraction) (HCC) EF 35-40.  Ischemic evaluation has been avoided secondary to chronic kidney disease.  She is not having anginal symptoms.  Therefore, we will not pursue cardiac catheterization unless she has unstable angina or ends up on dialysis.  She is not on ACE/ARB/ARN I secondary to chronic kidney disease.  We did discuss benefits of taking SGLT2 inhibitors in the setting of HFrEF.  Hopefully, she will remain on empagliflozin.  Continue hydralazine 25 mg 3 times a day, isosorbide mononitrate 30 mg daily.  Her heart rate is somewhat elevated.  She has not taken metoprolol succinate yet today.  I have recommended that she increase metoprolol succinate to 75 mg daily.  She can certainly decrease this back to 50 mg daily if she has side effects (dizziness).  Continue torsemide 40 mg daily.  2. CKD (chronic kidney disease) stage 4, GFR 15-29 ml/min (HCC) She is followed by Dr. Justin Mend with nephrology.  Creatinine in 12/21 was 2.5.  She notes that she would never go on dialysis.  3. Essential hypertension Blood pressure is well controlled.  Continue hydralazine 25 mg 3 times daily, isosorbide mononitrate 30 mg daily and increase metoprolol succinate to 75 mg daily as outlined above.  4. Pure hypercholesterolemia Managed by primary care.    Dispo:  Return  in about 6 months (around 09/28/2021) for Routine Follow Up, w/ Dr. Acie Fredrickson.   Medication Adjustments/Labs and Tests  Ordered: Current medicines are reviewed at length with the patient today.  Concerns regarding medicines are outlined above.  Tests Ordered: No orders of the defined types were placed in this encounter.  Medication Changes: No orders of the defined types were placed in this encounter.   Signed, Richardson Dopp, PA-C  03/28/2021 9:58 AM    [facility] [place], [place], [place]  [address] Phone: [phone]; Fax: [phone]

## 2021-03-28 ENCOUNTER — Ambulatory Visit: Payer: Medicare Other | Admitting: Physician Assistant

## 2021-03-28 ENCOUNTER — Encounter: Payer: Self-pay | Admitting: Physician Assistant

## 2021-03-28 ENCOUNTER — Other Ambulatory Visit: Payer: Self-pay

## 2021-03-28 VITALS — BP 118/80 | HR 107 | Ht 68.0 in | Wt 287.4 lb

## 2021-03-28 DIAGNOSIS — I10 Essential (primary) hypertension: Secondary | ICD-10-CM | POA: Diagnosis not present

## 2021-03-28 DIAGNOSIS — E7800 Pure hypercholesterolemia, unspecified: Secondary | ICD-10-CM | POA: Diagnosis not present

## 2021-03-28 DIAGNOSIS — I5020 Unspecified systolic (congestive) heart failure: Secondary | ICD-10-CM | POA: Diagnosis not present

## 2021-03-28 DIAGNOSIS — N184 Chronic kidney disease, stage 4 (severe): Secondary | ICD-10-CM

## 2021-03-28 DIAGNOSIS — I2729 Other secondary pulmonary hypertension: Secondary | ICD-10-CM

## 2021-03-28 MED ORDER — METOPROLOL SUCCINATE ER 50 MG PO TB24: 75.0000 mg | ORAL_TABLET | Freq: Every day | ORAL | 3 refills | Status: DC

## 2021-03-28 MED ORDER — ISOSORBIDE MONONITRATE ER 30 MG PO TB24: 30.0000 mg | ORAL_TABLET | Freq: Every day | ORAL | 3 refills | Status: DC

## 2021-03-28 MED ORDER — TORSEMIDE 20 MG PO TABS: 40.0000 mg | ORAL_TABLET | Freq: Every day | ORAL | 3 refills | Status: DC

## 2021-03-28 NOTE — Patient Instructions (Addendum)
Medication Instructions:   INCREASE TOPROL one and one half tablet by mouth ( 75 mg) daily.   *If you need a refill on your cardiac medications before your next appointment, please call your pharmacy*  Lab Work:  -NONE  If you have labs (blood work) drawn today and your tests are completely normal, you will receive your results only by: [place] (if you have MyChart) OR A paper copy in the mail If you have any lab test that is abnormal or we need to change your treatment, we will call you to review the results.  Testing/Procedures:  -NONE  Follow-Up: At [facility], you and your health needs are our priority.  As part of our continuing mission to provide you with exceptional heart care, we have created designated Provider Care Teams.  These Care Teams include your primary Cardiologist (physician) and Advanced Practice Providers (APPs -  Physician Assistants and Nurse Practitioners) who all work together to provide you with the care you need, when you need it.  We recommend signing up for the patient portal called "MyChart".  Sign up information is provided on this After Visit Summary.  MyChart is used to connect with patients for Virtual Visits (Telemedicine).  Patients are able to view lab/test results, encounter notes, upcoming appointments, etc.  Non-urgent messages can be sent to your provider as well.   To learn more about what you can do with MyChart, go to [email or web address].    Your next appointment:   6 month(s) with Dr. Cathie Olden on Wednesday, February 15 @ 11:00 am.   The format for your next appointment:   In Person  Provider:   Mertie Moores, MD   Other Instructions

## 2021-06-08 ENCOUNTER — Encounter: Payer: Self-pay | Admitting: Internal Medicine

## 2021-06-14 IMAGING — DX DG CHEST 1V PORT
1 series · 1 of 1 positions shown · non-contrast
Comparison: 10/12/2017

CLINICAL DATA: Shortness of breath

EXAM:
PORTABLE CHEST 1 VIEW

[chest ap]
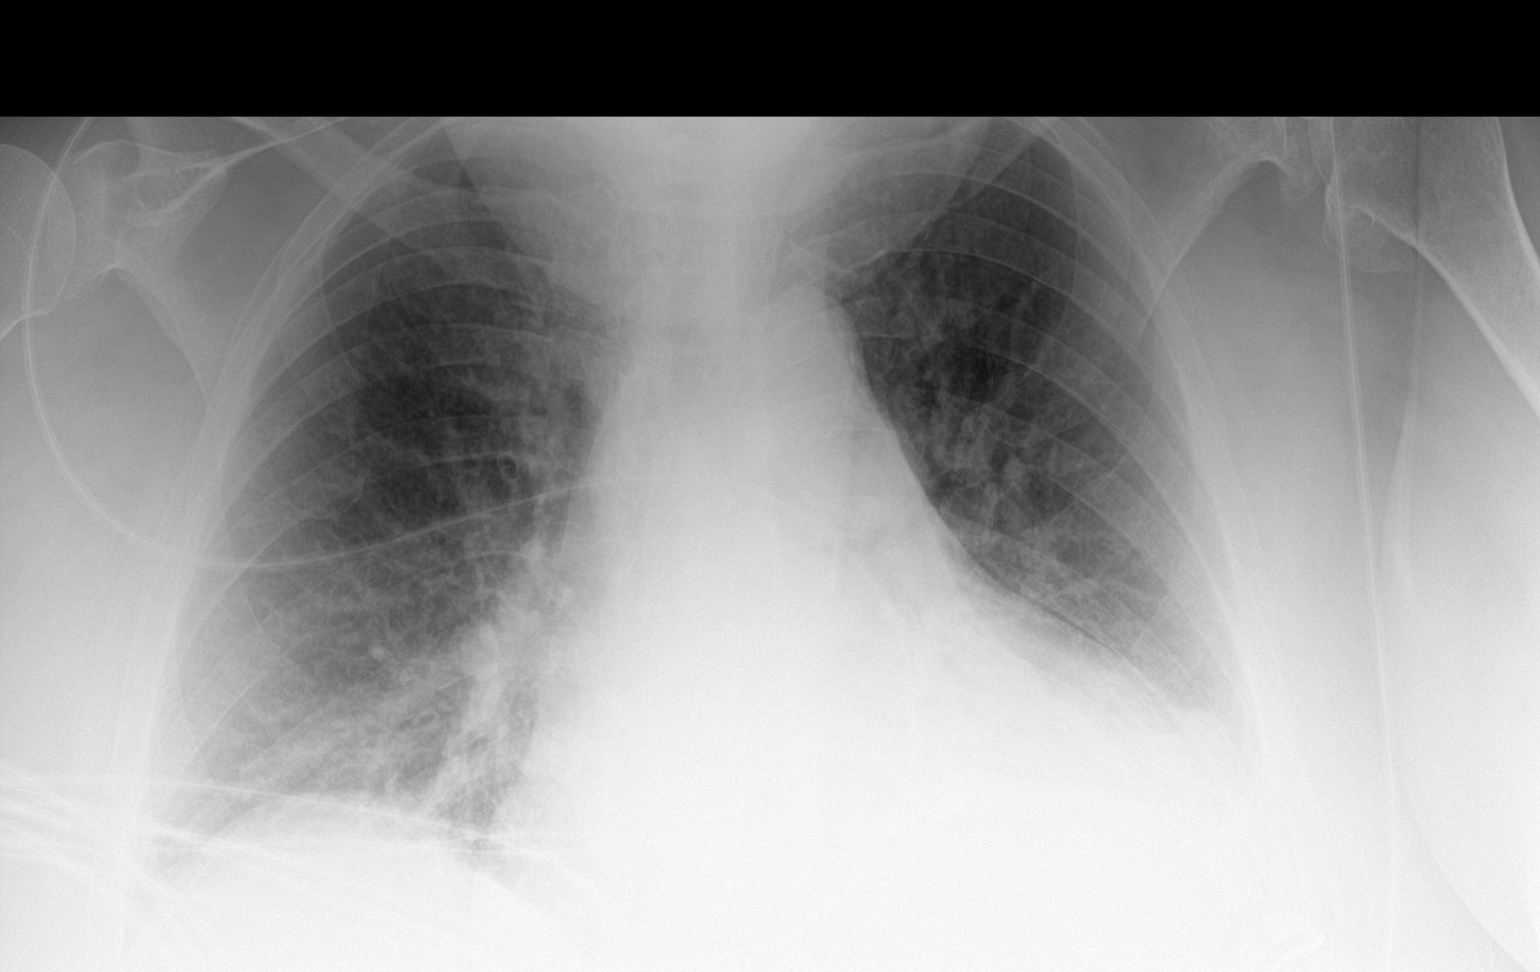

[1 of 1 positions shown; findings below may reference images not displayed]

FINDINGS: Low lung volumes with accentuation of the cardiac silhouette. Hazy
bibasilar opacities. No large pleural effusion. No pneumothorax.
Overlapping soft tissue structures partially obscure the lung
apices. Advanced degenerative changes of the left shoulder.
IMPRESSION: Low lung volumes with hazy bibasilar opacities, likely atelectasis.

## 2021-06-14 IMAGING — CT CT CHEST W/O CM
2 of 4 series · 13 of 36 positions shown, 16 images · non-contrast
Comparison: September 19, 2017.
COMPARISON: September 19, 2017.

Addendum:
CLINICAL DATA: Difficulty breathing, abdominal distention.

EXAM:
CT CHEST, ABDOMEN AND PELVIS WITHOUT CONTRAST
TECHNIQUE: Multidetector CT imaging of the chest, abdomen and pelvis was
performed following the standard protocol without IV contrast.

[Series 3: cap w/o · axial · non-contrast · 0.98mm/px · z∈[+1005,+1545]mm · 10 of 128 slices shown, 13 images]
[im 10/128  mediastinal]
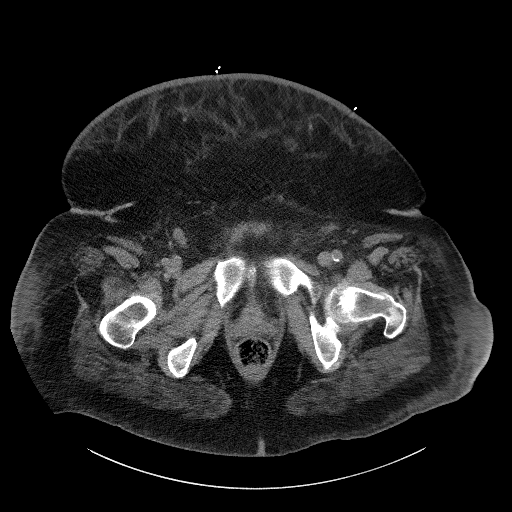
[im 10/128  lung]
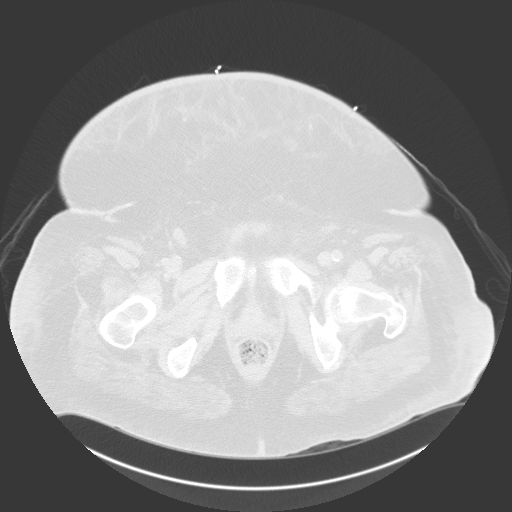
[im 20/128  lung]
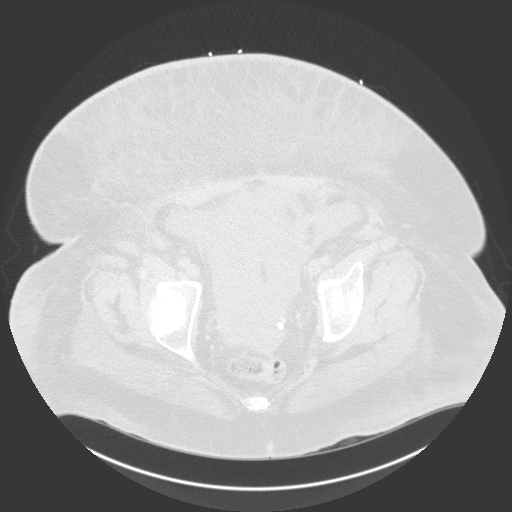
[im 40/128  lung]
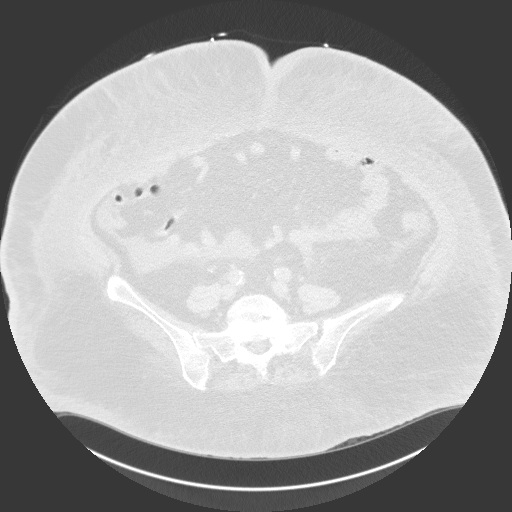
[im 49/128  lung]
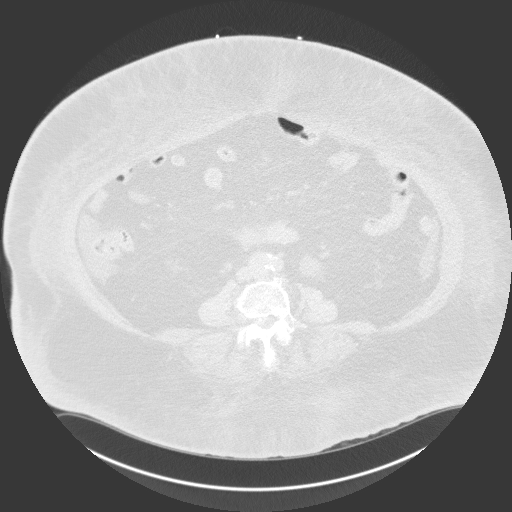
[im 59/128  mediastinal]
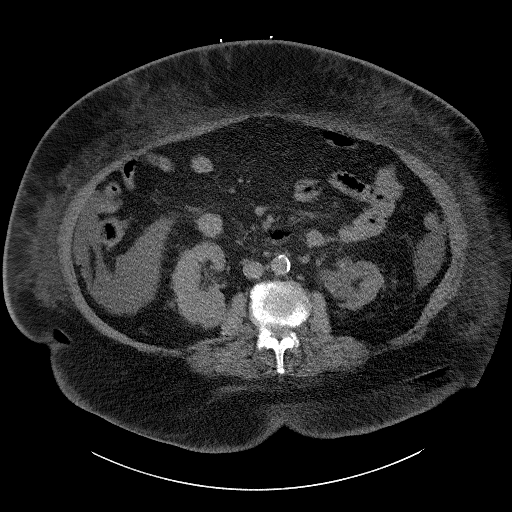
[im 59/128  lung]
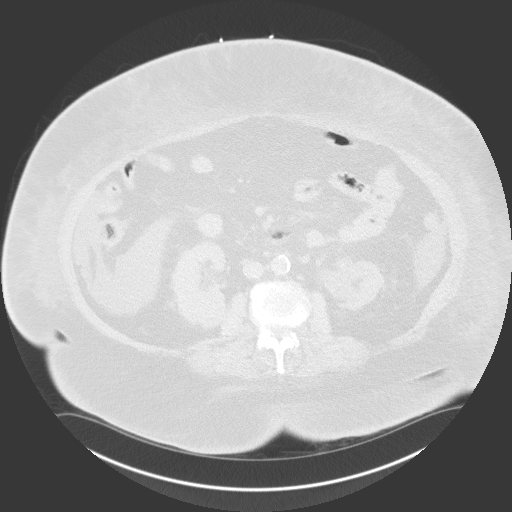
[im 69/128  lung]
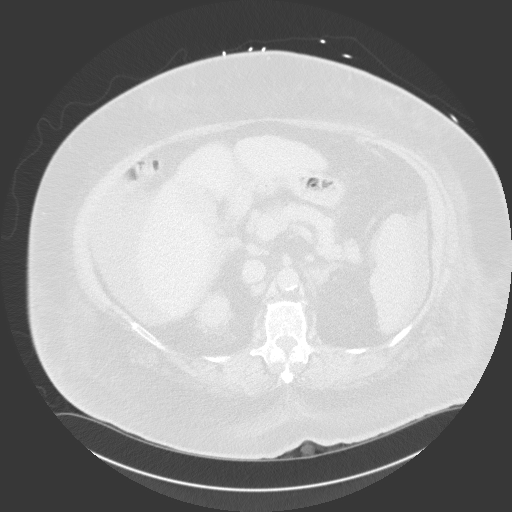
[im 79/128  lung]
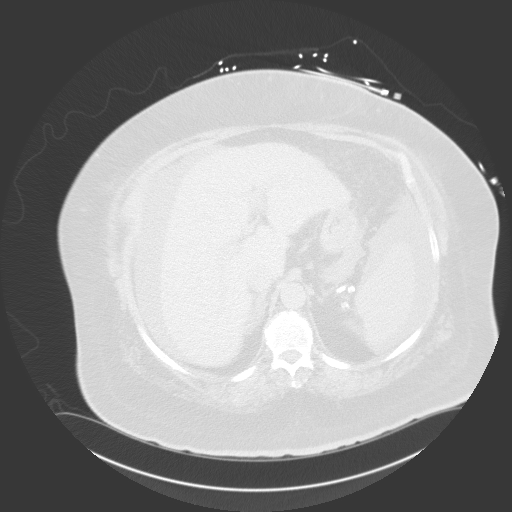
[im 98/128  lung]
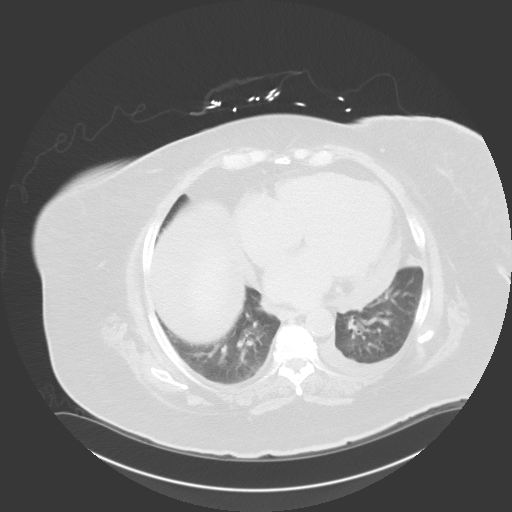
[im 108/128  mediastinal]
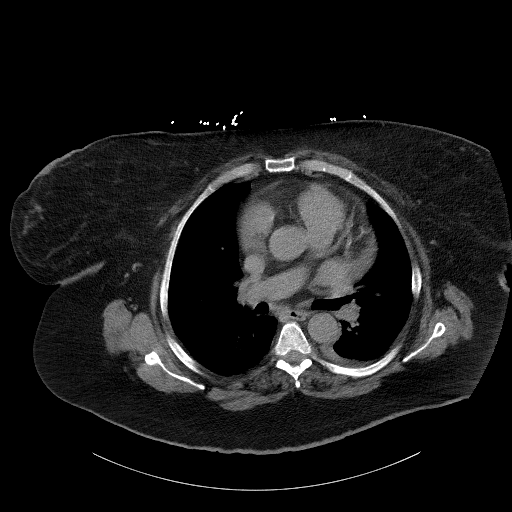
[im 108/128  lung]
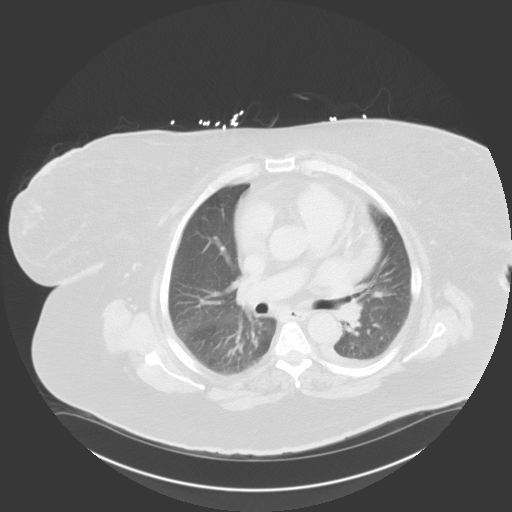
[im 118/128  lung]
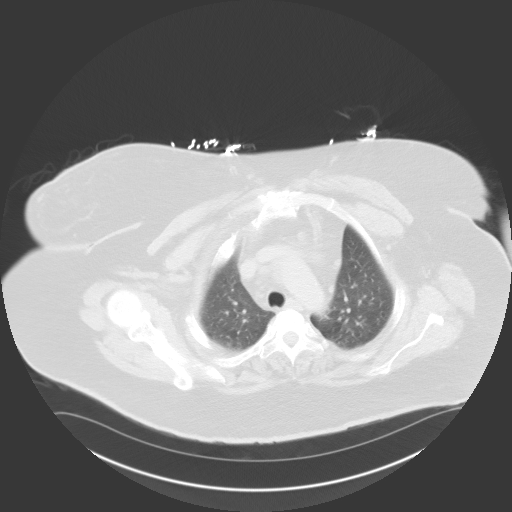

[Series 5: coronals · coronal · 1.17mm/px · 3 of 166 slices shown]
[im 34/166  lung]
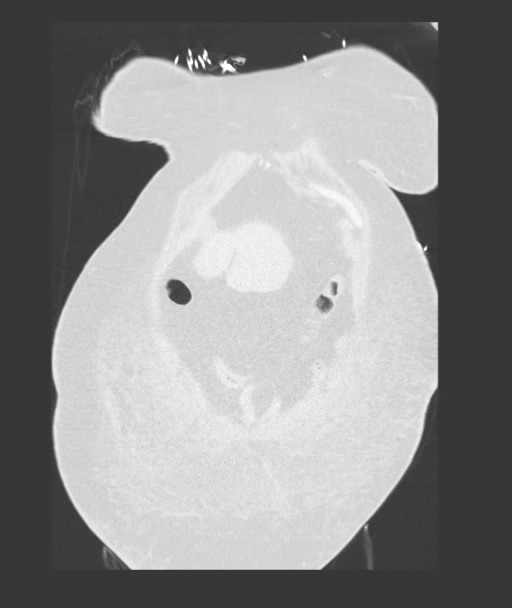
[im 67/166  lung]
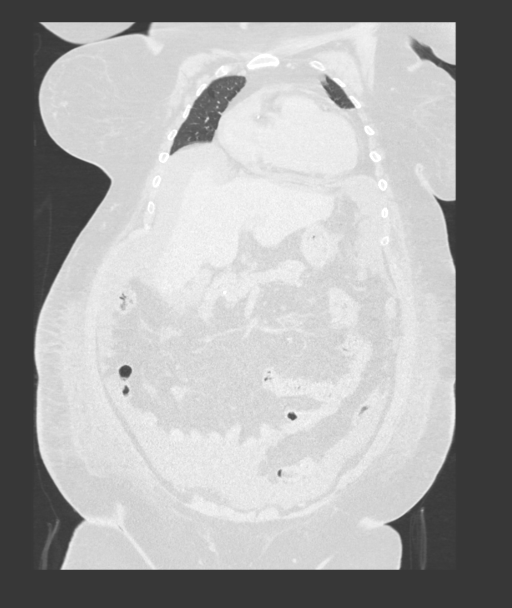
[im 100/166  lung]
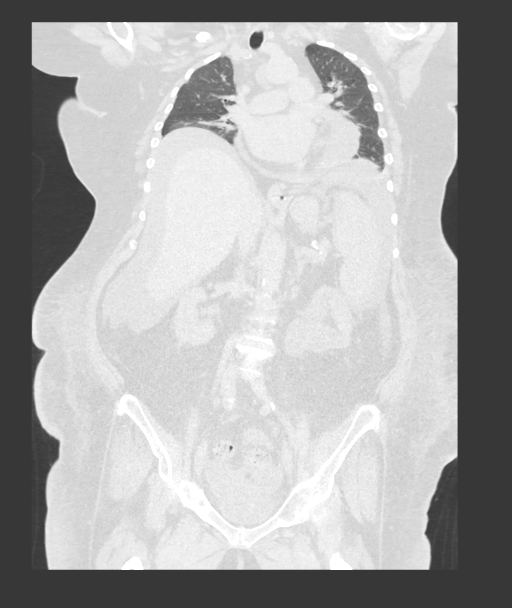

[13 of 36 positions shown; findings below may reference images not displayed]

FINDINGS: CT CHEST FINDINGS

Cardiovascular: Atherosclerosis of thoracic aorta is noted without
aneurysmal dilatation. Mild cardiomegaly is noted. Small pericardial
effusion is noted.

Mediastinum/Nodes: No enlarged mediastinal, hilar, or axillary lymph
nodes. Thyroid gland, trachea, and esophagus demonstrate no
significant findings.

Lungs/Pleura: No pneumothorax is noted. Small left pleural effusion
is noted. Right lung is clear.

Musculoskeletal: No chest wall mass or suspicious bone lesions
identified.

CT ABDOMEN PELVIS FINDINGS

Hepatobiliary: Mild cholelithiasis is noted. No biliary dilatation
is noted. No focal abnormality is noted in the liver on these
unenhanced images. Possible nodular hepatic contours are noted
suggesting cirrhosis.

Pancreas: Unremarkable. No pancreatic ductal dilatation or
surrounding inflammatory changes.

Spleen: Normal in size without focal abnormality.

Adrenals/Urinary Tract: Adrenal glands appear normal. Nonobstructive
right nephrolithiasis is noted. Severe left renal atrophy is noted
with severe left hydronephrosis due to 4 mm calculus at right
ureteropelvic junction. Nonobstructive left renal calculi are noted
as well. Urinary bladder is unremarkable.

Stomach/Bowel: The stomach appears normal. There is no evidence of
bowel obstruction or inflammation. The appendix is not visualized.

Vascular/Lymphatic: Aortic atherosclerosis. No enlarged abdominal or
pelvic lymph nodes.

Reproductive: Status post hysterectomy. No adnexal masses.

Other: Mild ascites is noted around the liver and spleen and in the
pelvis. Mild anasarca is noted. No definite hernia is noted.

Musculoskeletal: No acute or significant osseous findings.
IMPRESSION: 1. Severe left renal atrophy is noted with severe left
hydronephrosis due to 4 mm calculus at right ureteropelvic junction.
Bilateral nephrolithiasis is noted.
2. Mild ascites is noted around the liver and spleen and in the
pelvis. Mild anasarca is noted.
3. Possible nodular hepatic contours are noted suggesting cirrhosis.
4. Small pericardial effusion.
5. Mild cholelithiasis.

Aortic Atherosclerosis (1YSPE-FVS.S).

ADDENDUM:
Correction to impression needs to be made. The 4 mm calculus is at
the left ureteropelvic junction, not the right. These results were
discussed by telephone at the time of interpretation on 10/20/2019 at
[DATE] with provider DON LOLITO ONACRAM , who verbally acknowledged
these results.

*** End of Addendum ***
FINDINGS: CT CHEST FINDINGS

Cardiovascular: Atherosclerosis of thoracic aorta is noted without
aneurysmal dilatation. Mild cardiomegaly is noted. Small pericardial
effusion is noted.

Mediastinum/Nodes: No enlarged mediastinal, hilar, or axillary lymph
nodes. Thyroid gland, trachea, and esophagus demonstrate no
significant findings.

Lungs/Pleura: No pneumothorax is noted. Small left pleural effusion
is noted. Right lung is clear.

Musculoskeletal: No chest wall mass or suspicious bone lesions
identified.

CT ABDOMEN PELVIS FINDINGS

Hepatobiliary: Mild cholelithiasis is noted. No biliary dilatation
is noted. No focal abnormality is noted in the liver on these
unenhanced images. Possible nodular hepatic contours are noted
suggesting cirrhosis.

Pancreas: Unremarkable. No pancreatic ductal dilatation or
surrounding inflammatory changes.

Spleen: Normal in size without focal abnormality.

Adrenals/Urinary Tract: Adrenal glands appear normal. Nonobstructive
right nephrolithiasis is noted. Severe left renal atrophy is noted
with severe left hydronephrosis due to 4 mm calculus at right
ureteropelvic junction. Nonobstructive left renal calculi are noted
as well. Urinary bladder is unremarkable.

Stomach/Bowel: The stomach appears normal. There is no evidence of
bowel obstruction or inflammation. The appendix is not visualized.

Vascular/Lymphatic: Aortic atherosclerosis. No enlarged abdominal or
pelvic lymph nodes.

Reproductive: Status post hysterectomy. No adnexal masses.

Other: Mild ascites is noted around the liver and spleen and in the
pelvis. Mild anasarca is noted. No definite hernia is noted.

Musculoskeletal: No acute or significant osseous findings.
IMPRESSION: 1. Severe left renal atrophy is noted with severe left
hydronephrosis due to 4 mm calculus at right ureteropelvic junction.
Bilateral nephrolithiasis is noted.
2. Mild ascites is noted around the liver and spleen and in the
pelvis. Mild anasarca is noted.
3. Possible nodular hepatic contours are noted suggesting cirrhosis.
4. Small pericardial effusion.
5. Mild cholelithiasis.

Aortic Atherosclerosis (1YSPE-FVS.S).

## 2021-07-11 ENCOUNTER — Other Ambulatory Visit: Payer: Self-pay | Admitting: Internal Medicine

## 2021-07-11 DIAGNOSIS — Z1231 Encounter for screening mammogram for malignant neoplasm of breast: Secondary | ICD-10-CM

## 2021-07-14 ENCOUNTER — Other Ambulatory Visit: Payer: Self-pay

## 2021-07-14 ENCOUNTER — Ambulatory Visit
Admission: RE | Admit: 2021-07-14 | Discharge: 2021-07-14 | Disposition: A | Discharge status: Home / Self Care | Payer: Medicare Other | Source: Ambulatory Visit | Attending: Internal Medicine | Admitting: Internal Medicine

## 2021-07-14 DIAGNOSIS — Z1231 Encounter for screening mammogram for malignant neoplasm of breast: Secondary | ICD-10-CM

## 2021-09-16 ENCOUNTER — Encounter: Payer: Self-pay | Admitting: Cardiovascular Disease

## 2021-09-16 NOTE — Progress Notes (Signed)
Cardiology Office Note:    Date:  09/17/2021   ID:  TALAYEH BRUINSMA, DOB [birth year], MRN [id number]  PCP:  Shon Baton, MD  [facility] Cardiologist:  Mertie Moores, MD  [facility] Electrophysiologist:  None   Referring MD: Shon Baton, MD   Chief Complaint  Patient presents with   Congestive Heart Failure      History of Present Illness:    Sheila Leach is a 66 y.o. female with a hx of hypertension, hyperlipidemia, obesity, chronic kidney disease who I met in the hospital in March, 2021 when she presented with congestive heart failure.  Echocardiogram revealed moderate LV dysfunction with EF of 35 to 40%.  We were not able to determine diastolic parameters.  She had moderately elevated pulmonary artery pressures.  Mild mitral regurgitation.  She was seen in our office in April by Richardson Dopp, PA.  She had lost quite a bit of weight.  She gone from 314 pounds down to 274 pounds.  She is not had any any significant shortness of breath.  She denies any syncope.  She has been seeing nephrology.  Since that time she is done well.  Her weight has continued to decrease.  She is not had any episodes of shortness of breath.  No CP  She is on hydralazine 25 3 times a day, isosorbide 30 mg a day, Toprol-XL 50 mg a day, torsemide 40 mg a day. She watches her salt Is now waking , Creatinine is 2.56.  We have not tried her on ARB, entresto  or spironolactone.  Echocardiogram in May revealed no change in LV function.  Revealed a left pleural effusion.  Chest  x-ray from January 03, 2020 reveals no significant pleural effusion.  We are avoiding ischemic work-up, especially heart catheterization because of her elevated creatinine.  She does not want to go on dialysis.  I think that we will wait and consider doing a heart catheterization if at some point she goes on dialysis.  She is currently not having any episodes of angina.  Feb. 15, 2023: Sheila Leach is seen today for follow up of her CHF. Wt is  296 lbs ( up 22 lbs)  - has been been getting into the "junk food" according to her   Still gets DOE if walks too much - its unclear if this DOE is related to worsening CHF vs. Her weight gain .  Tried Jardiance - caused nausea Is now on Farxiga 5 mg a day    Past Medical History:  Diagnosis Date   Asthma    inhaler prn   Bilateral ureteral calculi    10-21-2017 currently has retained right ureteral stent and left nephrostomy tube in place   Cancer [facility])    uterine tumor   Chronic kidney disease stage 4 56/81/2751   Chronic systolic CHF 70/08/7492   Echocardiogram 10/2019: EF 35-40, mild LVE, global HK, mod elevated RVSP (41.4), mild MR   // Echo 12/2019: EF 35-40, global HK, GRII DD, GLS -12.2%, moderate left pleural effusion    Complication of anesthesia    trouble breathing after anesthesia didnt know had asthma at the time   DDD (degenerative disc disease), lumbar    pt adenies at preop   History of acute renal failure 09/19/2017   hospital admission --- hydronephrosis ,  ureteral stones   History of endometrial cancer previous oncolgoist-- dr Alycia Rossetti--- pt was released , no recurrence   2010  dx endometrial cancer--- 11-27-2008 s/p  TAH w/ BSO with bilateral pelvic node dissection's--- Stage IB, 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 , negative nodes, no chemo , completed post operative vaginal cuff brachii therapy 07/ 2010   History of kidney stones    Hx of radiation therapy 6/6/, 6/21, 6/24, 02/07/2009   post operative vaginal cuff branii therapy   Hyperlipidemia    Hypertension    10-21-2017  per pt was on lisinopril prior to hospital admission 02/ 2019 she become hypotensive so lisinopril was discontinued   Hypothyroidism, postsurgical FOLLOWED BY PCP   10-21-2017 per pt dx thyroid goiter at age 54 (age 36 s/p  partial thyroidectomy), age 68 thryoid greatly increased in size felt to be pre-cancerous (s/p  total thyroidectomy)  no radiation or chemo,  no  recurrence   Left sided sciatica    10-21-2017  per pt nerve injury during hysterectomy surgery 04/ 2010  ,  left leg weaker than right but gait issue  pt. denies   Osteoporosis    Other secondary pulmonary hypertension ([place]) 11/27/2019   Echocardiogram 10/2019: RVSP 41.4 // likely multifactorial (obesity, acute CHF,?OSA)   Personal history of colonic adenomas 01/24/2013   Seasonal allergies     Past Surgical History:  Procedure Laterality Date   CESAREAN SECTION  1988   CYSTOSCOPY W/ URETERAL STENT PLACEMENT Bilateral 09/19/2017   Procedure: CYSTOSCOPY WITH BILATERAL RETROGRADE PYELOGRAM/RIGHT URETERAL STENT PLACEMENT;  Surgeon: Cleon Gustin, MD;  Location: [facility];  Service: Urology;  Laterality: Bilateral;   CYSTOSCOPY WITH RETROGRADE PYELOGRAM, URETEROSCOPY AND STENT PLACEMENT Bilateral 10/29/2017   Procedure: CYSTOSCOPY WITH RETROGRADE PYELOGRAM, URETEROSCOPY AND STENT PLACEMENT, REMOVAL LEFT NEPHROSTOMY TUBE;  Surgeon: Cleon Gustin, MD;  Location: [facility];  Service: Urology;  Laterality: Bilateral;   DILATION AND CURETTAGE OF UTERUS  1996   w/ resectoscopic resection fibroids and Dx Laparoscopy   HOLMIUM LASER APPLICATION Bilateral 1/66/0630   Procedure: HOLMIUM LASER APPLICATION;  Surgeon: Cleon Gustin, MD;  Location[facility];  Service: Urology;  Laterality: Bilateral;   IR NEPHROSTOMY PLACEMENT LEFT  09/20/2017   PARATHYROIDECTOMY Left 05/12/2019   Procedure: LEFT SUPERIOR PARATHYROIDECTOMY;  Surgeon: Armandina Gemma, MD;  Location: [facility];  Service: General;  Laterality: Left;   THYROIDECTOMY, PARTIAL  age 22   TONSILLECTOMY  age 58   TOTAL ABDOMINAL HYSTERECTOMY W/ BILATERAL SALPINGOOPHORECTOMY  11-27-2008   dr Alycia Rossetti  [facility]   w/ Bilateral Pelvic Lymph Node Dissection's    TOTAL THYROIDECTOMY  age 74   pre -cancerous   TRANSTHORACIC ECHOCARDIOGRAM  09/21/2017   mild concentric LVH, ef 50-55%/  trivial MR and TR    Current  Medications: Current Meds  Medication Sig   acetaminophen (TYLENOL) 500 MG tablet Take 1,000 mg by mouth every 6 (six) hours as needed for moderate pain.   albuterol (PROVENTIL HFA;VENTOLIN HFA) 108 (90 BASE) MCG/ACT inhaler Inhale 2 puffs into the lungs every 6 (six) hours as needed for wheezing or shortness of breath.   cetirizine (ZYRTEC) 10 MG tablet Take 10 mg by mouth in the morning.    dapagliflozin propanediol (FARXIGA) 5 MG TABS tablet Take 5 mg by mouth daily.   fenofibrate 160 MG tablet Take 160 mg by mouth every morning.    isosorbide mononitrate (IMDUR) 30 MG 24 hr tablet Take 1 tablet (30 mg total) by mouth daily.   levothyroxine (SYNTHROID) 175 MCG tablet Take 350 mcg by mouth daily before breakfast.    metoprolol succinate (TOPROL-XL) 50 MG  24 hr tablet Take 1.5 tablets (75 mg total) by mouth daily. Take with or immediately following a meal.   Multiple Vitamins-Minerals (MULTIVITAMIN WITH MINERALS) tablet Take 1 tablet by mouth daily.   ondansetron (ZOFRAN ODT) 4 MG disintegrating tablet Take 1 tablet (4 mg total) by mouth every 8 (eight) hours as needed for nausea or vomiting.   polyethylene glycol (MIRALAX / GLYCOLAX) packet Take 17 g by mouth daily as needed for moderate constipation.   sodium bicarbonate 650 MG tablet Take 1,300 mg by mouth daily.   torsemide (DEMADEX) 20 MG tablet Take 2 tablets (40 mg total) by mouth daily. Take extra 2 pills a day only if you have weight gain more than 3 pounds overnight or 5 pounds in a week or worsening shortness of breath or pedal edema...     Allergies:   Betadine [povidone iodine], Contrast media [iodinated contrast media], Other, Ibuprofen, Iodine, and Adhesive [tape]   Social History   Socioeconomic History   Marital status: Divorced    Spouse name: Not on file   Number of children: Not on file   Years of education: Not on file   Highest education level: Not on file  Occupational History   Not on file  Tobacco Use    Smoking status: Never   Smokeless tobacco: Never  Vaping Use   Vaping Use: Never used  Substance and Sexual Activity   Alcohol use: No   Drug use: No   Sexual activity: Not Currently  Other Topics Concern   Not on file  Social History Narrative   Not on file   Social Determinants of Health   Financial Resource Strain: Not on file  Food Insecurity: Not on file  Transportation Needs: Not on file  Physical Activity: Not on file  Stress: Not on file  Social Connections: Not on file     Family History: The patient's family history includes Breast cancer in an other family member; Diabetes in her brother and mother; Esophageal cancer in her brother; Heart disease in her father and mother; Kidney disease in her mother; Liver disease in her brother; Thyroid cancer in her sister. There is no history of Colon cancer, Rectal cancer, or Stomach cancer.  ROS:   Please see the history of present illness.     All other systems reviewed and are negative.  EKGs/Labs/Other Studies Reviewed:    The following studies were reviewed today:   EKG:     Recent Labs: 09/17/2021: BUN 39; Creatinine, Ser 2.84; Potassium 4.5; Sodium 137  Recent Lipid Panel No results found for: CHOL, TRIG, HDL, CHOLHDL, VLDL, LDLCALC, LDLDIRECT  Physical Exam:    Physical Exam: Blood pressure 134/82, pulse 98, height 5' 7.5" (1.715 m), weight 296 lb (134.3 kg), SpO2 94 %.  GEN:  morbidly obese female,  HEENT: Normal NECK: No JVD; No carotid bruits LYMPHATICS: No lymphadenopathy CARDIAC: RRR   RESPIRATORY:  Clear to auscultation without rales, wheezing or rhonchi  ABDOMEN: Soft, non-tender, non-distended MUSCULOSKELETAL:  No edema; No deformity  SKIN: Warm and dry NEUROLOGIC:  Alert and oriented x 3   ASSESSMENT:    1. HFrEF (heart failure with reduced ejection fraction) ([place])   2. Essential hypertension   3. Medication management     PLAN:    In order of problems listed above:  Chronic  combined systolic and diastolic congestive heart failure:  Will repeat her echo to assess her LV function .  Last creatinine was 2.8.  recheck today .  Tried Jardiance - caused nausea Is now on Farxiga 5 mg a day  If creatinine remains this level,  it will be difficult to add in any additional RAS  meds   2.  Hypertension: cont meds.   Return in 1 year to see appp Medication Adjustments/Labs and Tests Ordered: Current medicines are reviewed at length with the patient today.  Concerns regarding medicines are outlined above.  Orders Placed This Encounter  Procedures   Basic metabolic panel   ECHOCARDIOGRAM COMPLETE   No orders of the defined types were placed in this encounter.   Patient Instructions  Medication Instructions:  The current medical regimen is effective;  continue present plan and medications.  *If you need a refill on your cardiac medications before your next appointment, please call your pharmacy*  Lab Work: Please have blood work today.  If you have labs (blood work) drawn today and your tests are completely normal, you will receive your results only by: [place] (if you have MyChart) OR A paper copy in the mail If you have any lab test that is abnormal or we need to change your treatment, we will call you to review the results.   Testing/Procedures: Your physician has requested that you have an echocardiogram. Echocardiography is a painless test that uses sound waves to create images of your heart. It provides your doctor with information about the size and shape of your heart and how well your hearts chambers and valves are working. This procedure takes approximately one hour. There are no restrictions for this procedure.  Follow-Up: At [facility], you and your health needs are our priority.  As part of our continuing mission to provide you with exceptional heart care, we have created designated Provider Care Teams.  These Care Teams include your  primary Cardiologist (physician) and Advanced Practice Providers (APPs -  Physician Assistants and Nurse Practitioners) who all work together to provide you with the care you need, when you need it.  We recommend signing up for the patient portal called "MyChart".  Sign up information is provided on this After Visit Summary.  MyChart is used to connect with patients for Virtual Visits (Telemedicine).  Patients are able to view lab/test results, encounter notes, upcoming appointments, etc.  Non-urgent messages can be sent to your provider as well.   To learn more about what you can do with MyChart, go to [email or web address].    Your next appointment:   1 year(s)  The format for your next appointment:   In Person  Provider:   Mertie Moores, MD or NP/PA.  If primary card or EP is not listed click here to update    :1}   Thank you for choosing [facility]!!      Signed, Mertie Moores, MD  09/17/2021 5:42 PM    [place]

## 2021-09-17 ENCOUNTER — Other Ambulatory Visit: Payer: Self-pay

## 2021-09-17 ENCOUNTER — Encounter: Payer: Self-pay | Admitting: Cardiovascular Disease

## 2021-09-17 ENCOUNTER — Ambulatory Visit: Payer: Medicare Other | Admitting: Cardiovascular Disease

## 2021-09-17 VITALS — BP 134/82 | HR 98 | Ht 67.5 in | Wt 296.0 lb

## 2021-09-17 DIAGNOSIS — I10 Essential (primary) hypertension: Secondary | ICD-10-CM | POA: Diagnosis not present

## 2021-09-17 DIAGNOSIS — Z79899 Other long term (current) drug therapy: Secondary | ICD-10-CM

## 2021-09-17 DIAGNOSIS — I5042 Chronic combined systolic (congestive) and diastolic (congestive) heart failure: Secondary | ICD-10-CM

## 2021-09-17 DIAGNOSIS — I5020 Unspecified systolic (congestive) heart failure: Secondary | ICD-10-CM

## 2021-09-17 LAB — BASIC METABOLIC PANEL
BUN/Creatinine Ratio: 14 (ref 12–28)
BUN: 39 mg/dL — ABNORMAL HIGH (ref 8–27)
CO2: 22 mmol/L (ref 20–29)
Calcium: 10.3 mg/dL (ref 8.7–10.3)
Chloride: 90 mmol/L — ABNORMAL LOW (ref 96–106)
Creatinine, Ser: 2.84 mg/dL — ABNORMAL HIGH (ref 0.57–1.00)
Glucose: 395 mg/dL — ABNORMAL HIGH (ref 70–99)
Potassium: 4.5 mmol/L (ref 3.5–5.2)
Sodium: 137 mmol/L (ref 134–144)
eGFR: 18 mL/min/{1.73_m2} — ABNORMAL LOW (ref >59)

## 2021-09-17 NOTE — Patient Instructions (Signed)
Medication Instructions:  The current medical regimen is effective;  continue present plan and medications.  *If you need a refill on your cardiac medications before your next appointment, please call your pharmacy*  Lab Work: Please have blood work today.  If you have labs (blood work) drawn today and your tests are completely normal, you will receive your results only by: [place] (if you have MyChart) OR A paper copy in the mail If you have any lab test that is abnormal or we need to change your treatment, we will call you to review the results.   Testing/Procedures: Your physician has requested that you have an echocardiogram. Echocardiography is a painless test that uses sound waves to create images of your heart. It provides your doctor with information about the size and shape of your heart and how well your hearts chambers and valves are working. This procedure takes approximately one hour. There are no restrictions for this procedure.  Follow-Up: At [facility], you and your health needs are our priority.  As part of our continuing mission to provide you with exceptional heart care, we have created designated Provider Care Teams.  These Care Teams include your primary Cardiologist (physician) and Advanced Practice Providers (APPs -  Physician Assistants and Nurse Practitioners) who all work together to provide you with the care you need, when you need it.  We recommend signing up for the patient portal called "MyChart".  Sign up information is provided on this After Visit Summary.  MyChart is used to connect with patients for Virtual Visits (Telemedicine).  Patients are able to view lab/test results, encounter notes, upcoming appointments, etc.  Non-urgent messages can be sent to your provider as well.   To learn more about what you can do with MyChart, go to [email or web address].    Your next appointment:   1 year(s)  The format for your next appointment:   In  Person  Provider:   Mertie Moores, MD or NP/PA.  If primary card or EP is not listed click here to update    :1}   Thank you for choosing [facility]!!

## 2021-09-30 ENCOUNTER — Ambulatory Visit (HOSPITAL_COMMUNITY): Payer: Medicare Other | Attending: Internal Medicine

## 2021-09-30 ENCOUNTER — Other Ambulatory Visit: Payer: Self-pay

## 2021-09-30 DIAGNOSIS — I10 Essential (primary) hypertension: Secondary | ICD-10-CM | POA: Diagnosis not present

## 2021-09-30 DIAGNOSIS — I5020 Unspecified systolic (congestive) heart failure: Secondary | ICD-10-CM | POA: Insufficient documentation

## 2021-09-30 LAB — ECHOCARDIOGRAM COMPLETE
Area-P 1/2: 7.37 cm2
S' Lateral: 3.7 cm

## 2021-10-02 ENCOUNTER — Telehealth: Payer: Self-pay

## 2021-10-02 DIAGNOSIS — I5020 Unspecified systolic (congestive) heart failure: Secondary | ICD-10-CM

## 2021-10-02 MED ORDER — HYDRALAZINE HCL 10 MG PO TABS: 10.0000 mg | ORAL_TABLET | Freq: Three times a day (TID) | ORAL | 3 refills | Status: DC

## 2021-10-02 NOTE — Telephone Encounter (Signed)
-----   Message from Thayer Headings, MD sent at 10/02/2021  9:38 AM EST ----- ?Pt has a low EF. ?She has not been taking hydralazine regularly. ?Please have her start Hydralazine 10 mg TID ?We have referred her to the CHF clinic for further recommendations ? ?PN ? ?----- Message ----- ?From: Richmond Campbell, LPN ?Sent: 10/01/2021  11:54 AM EST ?To: Thayer Headings, MD ? ?Per pt has not been taking Hydralazine 25 mg tid Pt's instructions says to take if systolic is above 820 and it has not been so pt has not been taking Will forward to Dr Acie Fredrickson for recommendations./cy ? ?

## 2021-10-02 NOTE — Telephone Encounter (Signed)
Spoke with the patient and gave her advisement per Dr. Acie Fredrickson to start on hydralazine 10 mg TID. Rx has been sent in. Referral has been placed for CHF clinic.  ?

## 2022-01-30 ENCOUNTER — Telehealth (HOSPITAL_COMMUNITY): Payer: Self-pay | Admitting: Vascular Surgery

## 2022-01-30 NOTE — Telephone Encounter (Signed)
Lvm giving pt new chf appt w/ [facility]/ asked pt to call back to confirm appt

## 2022-03-20 ENCOUNTER — Ambulatory Visit (HOSPITAL_COMMUNITY)
Admission: RE | Admit: 2022-03-20 | Discharge: 2022-03-20 | Disposition: A | Discharge status: Home / Self Care | Payer: Medicare Other | Source: Ambulatory Visit | Attending: Cardiology | Admitting: Cardiology

## 2022-03-20 ENCOUNTER — Encounter (HOSPITAL_COMMUNITY): Payer: Self-pay | Admitting: Cardiology

## 2022-03-20 VITALS — BP 128/78 | HR 102 | Wt 277.4 lb

## 2022-03-20 DIAGNOSIS — E7800 Pure hypercholesterolemia, unspecified: Secondary | ICD-10-CM | POA: Diagnosis not present

## 2022-03-20 DIAGNOSIS — N184 Chronic kidney disease, stage 4 (severe): Secondary | ICD-10-CM | POA: Insufficient documentation

## 2022-03-20 DIAGNOSIS — I5022 Chronic systolic (congestive) heart failure: Secondary | ICD-10-CM | POA: Diagnosis present

## 2022-03-20 DIAGNOSIS — I5042 Chronic combined systolic (congestive) and diastolic (congestive) heart failure: Secondary | ICD-10-CM | POA: Diagnosis not present

## 2022-03-20 DIAGNOSIS — I130 Hypertensive heart and chronic kidney disease with heart failure and stage 1 through stage 4 chronic kidney disease, or unspecified chronic kidney disease: Secondary | ICD-10-CM | POA: Insufficient documentation

## 2022-03-20 DIAGNOSIS — E1122 Type 2 diabetes mellitus with diabetic chronic kidney disease: Secondary | ICD-10-CM | POA: Insufficient documentation

## 2022-03-20 DIAGNOSIS — Z79899 Other long term (current) drug therapy: Secondary | ICD-10-CM | POA: Diagnosis not present

## 2022-03-20 LAB — CBC
HCT: 38.9 % (ref 36.0–46.0)
Hemoglobin: 13 g/dL (ref 12.0–15.0)
MCH: 30.3 pg (ref 26.0–34.0)
MCHC: 33.4 g/dL (ref 30.0–36.0)
MCV: 90.7 fL (ref 80.0–100.0)
Platelets: 379 10*3/uL (ref 150–400)
RBC: 4.29 MIL/uL (ref 3.87–5.11)
RDW: 13.2 % (ref 11.5–15.5)
WBC: 12.7 10*3/uL — ABNORMAL HIGH (ref 4.0–10.5)
nRBC: 0 % (ref 0.0–0.2)

## 2022-03-20 LAB — LIPID PANEL
Cholesterol: 276 mg/dL — ABNORMAL HIGH (ref 0–200)
HDL: 32 mg/dL — ABNORMAL LOW (ref >40)
LDL Cholesterol: UNDETERMINED mg/dL (ref 0–99)
Total CHOL/HDL Ratio: 8.6 RATIO
Triglycerides: 565 mg/dL — ABNORMAL HIGH (ref <150)
VLDL: UNDETERMINED mg/dL (ref 0–40)

## 2022-03-20 LAB — BASIC METABOLIC PANEL
Anion gap: 12 (ref 5–15)
BUN: 27 mg/dL — ABNORMAL HIGH (ref 8–23)
CO2: 24 mmol/L (ref 22–32)
Calcium: 8.9 mg/dL (ref 8.9–10.3)
Chloride: 102 mmol/L (ref 98–111)
Creatinine, Ser: 2.45 mg/dL — ABNORMAL HIGH (ref 0.44–1.00)
GFR, Estimated: 21 mL/min — ABNORMAL LOW (ref >60)
Glucose, Bld: 196 mg/dL — ABNORMAL HIGH (ref 70–99)
Potassium: 3.7 mmol/L (ref 3.5–5.1)
Sodium: 138 mmol/L (ref 135–145)

## 2022-03-20 LAB — BRAIN NATRIURETIC PEPTIDE: B Natriuretic Peptide: 99 pg/mL (ref 0.0–100.0)

## 2022-03-20 LAB — LDL CHOLESTEROL, DIRECT: Direct LDL: 148 mg/dL — ABNORMAL HIGH (ref 0–99)

## 2022-03-20 MED ORDER — HYDRALAZINE HCL 25 MG PO TABS: 25.0000 mg | ORAL_TABLET | Freq: Three times a day (TID) | ORAL | 3 refills | Status: DC

## 2022-03-20 NOTE — Patient Instructions (Addendum)
Increase hydralazine to 25 mg Three times a day   Labs done today, your results will be available in MyChart, we will contact you for abnormal readings.  Your physician has requested that you have an echocardiogram. Echocardiography is a painless test that uses sound waves to create images of your heart. It provides your doctor with information about the size and shape of your heart and how well your heart's chambers and valves are working. This procedure takes approximately one hour. There are no restrictions for this procedure.  Your physician has requested that you have a cardiac MRI. Cardiac MRI uses a computer to create images of your heart as its beating, producing both still and moving pictures of your heart and major blood vessels. For further information please visit [email or web address] Please follow the instruction sheet given to you today for more information.ONCE APPROVED BY YOUR INSURANCE COMPANY YOU WILL BE CALLED TO ARRANGE THE TEST.   You have been referred to the [facility]. They will call you to arrange the appointment.   Your physician recommends that you schedule a follow-up appointment in: 6 weeks   If you have any questions or concerns before your next appointment please send [place] a message through [place] or call our office at [phone].    TO LEAVE A MESSAGE FOR THE NURSE SELECT OPTION 2, PLEASE LEAVE A MESSAGE INCLUDING: YOUR NAME DATE OF BIRTH CALL BACK NUMBER REASON FOR CALL**this is important as we prioritize the call backs  YOU WILL RECEIVE A CALL BACK THE SAME DAY AS LONG AS YOU CALL BEFORE 4:00 PM  At the [facility], you and your health needs are our priority. As part of our continuing mission to provide you with exceptional heart care, we have created designated Provider Care Teams. These Care Teams include your primary Cardiologist (physician) and Advanced Practice Providers (APPs- Physician Assistants and Nurse  Practitioners) who all work together to provide you with the care you need, when you need it.   You may see any of the following providers on your designated Care Team at your next follow up: Dr Glori Bickers Dr Haynes Kerns, NP Lyda Jester, [place] [facility] [place], [place] Audry Riles, PharmD   Please be sure to bring in all your medications bottles to every appointment.

## 2022-03-20 NOTE — Progress Notes (Signed)
ReDS Vest / Clip - 03/20/22 1200       ReDS Vest / Clip   Station Marker D    Ruler Value 39    ReDS Value Range Low volume    ReDS Actual Value 31

## 2022-03-22 NOTE — Progress Notes (Signed)
PCP: Shon Baton, MD Cardiology: Dr. Acie Fredrickson HF cardiology: Dr. Aundra Dubin  67 y.o. with history of CKD stage 4, type 2 diabetes, HTN, and chronic systolic CHF was referred by Dr. Acie Fredrickson for evaluation of CHF.  Patient was initially diagnosed with CHF in 3/21, she was admitted at that time with volume overload and was diuresed.  She says that she felt like she had a flu-like illness prior to admission.  Echo showed EF 35-40%, Cath was not done due to CKD stage 4 with no ACS.  Repeat echo in 5/21 showed that EF remained 35-40%.  Since that time, I do not see that she has had repeat evaluation of LV function.  GDMT has been limited by renal dysfunction.  She has had side effects with Jardiance and Farxiga (nausea and vomiting).  No family history of cardiomyopathy, mother with ESRD due to diabetes and father with MI in his 32s. She does not smoke or drink ETOH.   She is stable symptomatically.  Left leg is weak from sciatica and she walks with a cane.  No chest pain.  She does get short of breath after walking about 100 yards, this is chronic.  No orthopnea/PND.  No lightheadedness/falls.    Labs (2/23): K 4.5, creatinine 2.84  ECG (personally reviewed): sinus tachy, PACs, LAFB, poor RWP  REDS clip 31%  PMH: 1. Asthma 2. HTN 3. Obesity 4. Hyperlipidemia 5. H/o thyroidectomy with hypothyroidism 6. CKD stage 4: Has history of bilateral ureteral obstruction from nephrolithiasis.  7. Nephrolithiasis 8. Endometrial cancer: TAH-BSO in 2010.  9. Sciatica 10. Type 2 diabetes 11. Chronic systolic CHF: Admitted in 3/21 with CHF.  Echo (3/21) with EF 35-40%, mild MR.  - Echo (5/21): EF 35-40%, global hypokinesis  Social History   Socioeconomic History   Marital status: Divorced    Spouse name: Not on file   Number of children: Not on file   Years of education: Not on file   Highest education level: Not on file  Occupational History   Not on file  Tobacco Use   Smoking status: Never   Smokeless  tobacco: Never  Vaping Use   Vaping Use: Never used  Substance and Sexual Activity   Alcohol use: No   Drug use: No   Sexual activity: Not Currently  Other Topics Concern   Not on file  Social History Narrative   Not on file   Social Determinants of Health   Financial Resource Strain: Not on file  Food Insecurity: Not on file  Transportation Needs: Not on file  Physical Activity: Not on file  Stress: Not on file  Social Connections: Not on file  Intimate Partner Violence: Not on file   Family History  Problem Relation Age of Onset   Heart disease Mother    Diabetes Mother    Kidney disease Mother    Heart disease Father    Thyroid cancer Sister    Esophageal cancer Brother    Diabetes Brother    Liver disease Brother    Breast cancer Other    Colon cancer Neg Hx    Rectal cancer Neg Hx    Stomach cancer Neg Hx    ROS: All systems reviewed and negative except as per HPI.   Current Outpatient Medications  Medication Sig Dispense Refill   acetaminophen (TYLENOL) 500 MG tablet Take 1,000 mg by mouth every 6 (six) hours as needed for moderate pain.     albuterol (PROVENTIL HFA;VENTOLIN HFA) 108 (90 BASE)  MCG/ACT inhaler Inhale 2 puffs into the lungs every 6 (six) hours as needed for wheezing or shortness of breath.     cetirizine (ZYRTEC) 10 MG tablet Take 10 mg by mouth in the morning.      fenofibrate 160 MG tablet Take 160 mg by mouth every morning.      isosorbide mononitrate (IMDUR) 30 MG 24 hr tablet Take 1 tablet (30 mg total) by mouth daily. 90 tablet 3   levothyroxine (SYNTHROID) 175 MCG tablet Take 350 mcg by mouth daily before breakfast.      linagliptin (TRADJENTA) 5 MG TABS tablet Take 5 mg by mouth daily.     metoprolol succinate (TOPROL-XL) 50 MG 24 hr tablet Take 1.5 tablets (75 mg total) by mouth daily. Take with or immediately following a meal. 135 tablet 3   Multiple Vitamins-Minerals (MULTIVITAMIN WITH MINERALS) tablet Take 1 tablet by mouth daily.      ondansetron (ZOFRAN ODT) 4 MG disintegrating tablet Take 1 tablet (4 mg total) by mouth every 8 (eight) hours as needed for nausea or vomiting. 20 tablet 0   polyethylene glycol (MIRALAX / GLYCOLAX) packet Take 17 g by mouth daily as needed for moderate constipation. 14 each 0   sodium bicarbonate 650 MG tablet Take 1,300 mg by mouth daily.     torsemide (DEMADEX) 20 MG tablet Take 2 tablets (40 mg total) by mouth daily. Take extra 2 pills a day only if you have weight gain more than 3 pounds overnight or 5 pounds in a week or worsening shortness of breath or pedal edema... 180 tablet 3   hydrALAZINE (APRESOLINE) 25 MG tablet Take 1 tablet (25 mg total) by mouth 3 (three) times daily. 180 tablet 3   No current facility-administered medications for this encounter.   BP 128/78   Pulse (!) 102   Wt 125.8 kg (277 lb 6.4 oz)   SpO2 94%   BMI 42.81 kg/m  General: NAD Neck: JVP 7-8 cm, no thyromegaly or thyroid nodule.  Lungs: Clear to auscultation bilaterally with normal respiratory effort. CV: Nondisplaced PMI.  Heart mildly tachy, regular S1/S2, no S3/S4, no murmur.  Trace ankle edema.  No carotid bruit.  Normal pedal pulses.  Abdomen: Soft, nontender, no hepatosplenomegaly, no distention.  Skin: Intact without lesions or rashes.  Neurologic: Alert and oriented x 3.  Psych: Normal affect. Extremities: No clubbing or cyanosis.  HEENT: Normal.   Assessment/Plan: 1. Chronic systolic CHF: Cardiomyopathy of uncertain etiology.  Echoes in 3/21 and 5/21 showed EF 35-40%.  No cath done due to CKD stage 4 and no ACS. She has not had symptoms concerning for ischemia.  She did have flu-like symptoms prior to 3/21 CHF admission so viral myocarditis is a concern.  No significant family history of cardiomyopathy and no concerning substance use.  She does not have PVCs on ECG today.  NYHA class II symptoms, REDS clip does not suggest significant volume overload.  GDMT limited by CKD stage 4.  - Continue  Toprol XL 75 mg daily.  - Continue Imdur 30 mg daily and increase hydralazine to 25 mg tid.  - No ARNI/ARB/ACEI/spironolactone with CKD stage 4.  - No SGLT2 inhibitor as she has tolerated neither Jardiance nor Farxiga in the past.  - She does not appear to need a loop diureetic.  - Needs repeat evaluation of EF, will arrange for echo.  - Will also try to get cardiac MRI for her, can give low dose MRI contrast and look  for any scarring pattern on delayed enhancement imaging to give [place] clues as to the etiology of her cardiomyopathy.  2. CKD stage 4: BMET today.   3. HTN: BP is controlled.   Loralie Champagne 03/22/2022

## 2022-03-25 ENCOUNTER — Telehealth: Payer: Self-pay | Admitting: Pharmacist

## 2022-03-25 MED ORDER — MOUNJARO 2.5 MG/0.5ML ~~LOC~~ SOAJ: 2.5000 mg | SUBCUTANEOUS | 0 refills | Status: DC

## 2022-03-25 NOTE — Telephone Encounter (Signed)
Pt made aware to pick up medication and put in fridge. Bring first injection with her to apt Monday.

## 2022-03-25 NOTE — Telephone Encounter (Signed)
Patient referred to pharmD by Dr. Aundra Dubin for GLP-1. Pt has hx of uncontrolled DM. She is currently on a DPP4. I called and discussed GLP-1 with her. Does has a hx of "pre-cancer" in her thyroid. They removed her thyroid. Denies that it was MTC or MENS2. No hx of gallstones or pancreatitis. Is ok with the cost as we will be stopping Trajenta and it should be the same cost. She does have CKD therefore titration will need to be done carefully.   Darcel Bayley is on patients plan. No PA needed. Rx sent. Pt scheduled for 8/28 in clinic

## 2022-03-26 ENCOUNTER — Telehealth: Payer: Self-pay | Admitting: Cardiovascular Disease

## 2022-03-26 NOTE — Telephone Encounter (Signed)
Pt c/o medication issue:  1. Name of Medication: tirzepatide [facility]) 2.5 MG/0.5ML Pen  2. How are you currently taking this medication (dosage and times per day)? Inject 2.5 mg into the skin once a week.  3. Are you having a reaction (difficulty breathing--STAT)? no  4. What is your medication issue? Pateint stated she couldn't afford the medication, so she cancel her pharmD appt. Please advise

## 2022-03-30 ENCOUNTER — Ambulatory Visit: Payer: Medicare Other

## 2022-03-30 ENCOUNTER — Encounter (HOSPITAL_COMMUNITY): Payer: Self-pay

## 2022-03-31 ENCOUNTER — Other Ambulatory Visit: Payer: Self-pay | Admitting: Physician Assistant

## 2022-05-01 NOTE — Progress Notes (Incomplete)
PCP: Shon Baton, MD Cardiology: Dr. Acie Fredrickson HF cardiology: Dr. Aundra Dubin  66 y.o. with history of CKD stage 4, type 2 diabetes, HTN, and chronic systolic CHF was referred by Dr. Acie Fredrickson for evaluation of CHF.  Patient was initially diagnosed with CHF in 3/21, she was admitted at that time with volume overload and was diuresed.  She says that she felt like she had a flu-like illness prior to admission.  Echo showed EF 35-40%, Cath was not done due to CKD stage 4 with no ACS.  Repeat echo in 5/21 showed that EF remained 35-40%.  Since that time, I do not see that she has had repeat evaluation of LV function.  GDMT has been limited by renal dysfunction.  She has had side effects with Jardiance and Farxiga (nausea and vomiting).  No family history of cardiomyopathy, mother with ESRD due to diabetes and father with MI in his 68s. She does not smoke or drink ETOH.   She is stable symptomatically.  Left leg is weak from sciatica and she walks with a cane.  No chest pain.  She does get short of breath after walking about 100 yards, this is chronic.  No orthopnea/PND.  No lightheadedness/falls.    Labs (2/23): K 4.5, creatinine 2.84  ECG (personally reviewed): sinus tachy, PACs, LAFB, poor RWP  REDS clip 31%  PMH: 1. Asthma 2. HTN 3. Obesity 4. Hyperlipidemia 5. H/o thyroidectomy with hypothyroidism 6. CKD stage 4: Has history of bilateral ureteral obstruction from nephrolithiasis.  7. Nephrolithiasis 8. Endometrial cancer: TAH-BSO in 2010.  9. Sciatica 10. Type 2 diabetes 11. Chronic systolic CHF: Admitted in 3/21 with CHF.  Echo (3/21) with EF 35-40%, mild MR.  - Echo (5/21): EF 35-40%, global hypokinesis  Social History   Socioeconomic History   Marital status: Divorced    Spouse name: Not on file   Number of children: Not on file   Years of education: Not on file   Highest education level: Not on file  Occupational History   Not on file  Tobacco Use   Smoking status: Never   Smokeless  tobacco: Never  Vaping Use   Vaping Use: Never used  Substance and Sexual Activity   Alcohol use: No   Drug use: No   Sexual activity: Not Currently  Other Topics Concern   Not on file  Social History Narrative   Not on file   Social Determinants of Health   Financial Resource Strain: Not on file  Food Insecurity: Not on file  Transportation Needs: Not on file  Physical Activity: Not on file  Stress: Not on file  Social Connections: Not on file  Intimate Partner Violence: Not on file   Family History  Problem Relation Age of Onset   Heart disease Mother    Diabetes Mother    Kidney disease Mother    Heart disease Father    Thyroid cancer Sister    Esophageal cancer Brother    Diabetes Brother    Liver disease Brother    Breast cancer Other    Colon cancer Neg Hx    Rectal cancer Neg Hx    Stomach cancer Neg Hx    ROS: All systems reviewed and negative except as per HPI.   Current Outpatient Medications  Medication Sig Dispense Refill   acetaminophen (TYLENOL) 500 MG tablet Take 1,000 mg by mouth every 6 (six) hours as needed for moderate pain.     albuterol (PROVENTIL HFA;VENTOLIN HFA) 108 (90 BASE)  MCG/ACT inhaler Inhale 2 puffs into the lungs every 6 (six) hours as needed for wheezing or shortness of breath.     cetirizine (ZYRTEC) 10 MG tablet Take 10 mg by mouth in the morning.      fenofibrate 160 MG tablet Take 160 mg by mouth every morning.      hydrALAZINE (APRESOLINE) 25 MG tablet Take 1 tablet (25 mg total) by mouth 3 (three) times daily. 180 tablet 3   isosorbide mononitrate (IMDUR) 30 MG 24 hr tablet Take 1 tablet by mouth once daily 90 tablet 0   levothyroxine (SYNTHROID) 175 MCG tablet Take 350 mcg by mouth daily before breakfast.      metoprolol succinate (TOPROL-XL) 50 MG 24 hr tablet Take 1.5 tablets (75 mg total) by mouth daily. Take with or immediately following a meal. 135 tablet 3   Multiple Vitamins-Minerals (MULTIVITAMIN WITH MINERALS) tablet  Take 1 tablet by mouth daily.     ondansetron (ZOFRAN ODT) 4 MG disintegrating tablet Take 1 tablet (4 mg total) by mouth every 8 (eight) hours as needed for nausea or vomiting. 20 tablet 0   polyethylene glycol (MIRALAX / GLYCOLAX) packet Take 17 g by mouth daily as needed for moderate constipation. 14 each 0   sodium bicarbonate 650 MG tablet Take 1,300 mg by mouth daily.     tirzepatide [facility]) 2.5 MG/0.5ML Pen Inject 2.5 mg into the skin once a week. 2 mL 0   torsemide (DEMADEX) 20 MG tablet Take 2 tablets (40 mg total) by mouth daily. Take extra 2 pills a day only if you have weight gain more than 3 pounds overnight or 5 pounds in a week or worsening shortness of breath or pedal edema... 180 tablet 3   No current facility-administered medications for this visit.   There were no vitals taken for this visit. General: NAD Neck: JVP 7-8 cm, no thyromegaly or thyroid nodule.  Lungs: Clear to auscultation bilaterally with normal respiratory effort. CV: Nondisplaced PMI.  Heart mildly tachy, regular S1/S2, no S3/S4, no murmur.  Trace ankle edema.  No carotid bruit.  Normal pedal pulses.  Abdomen: Soft, nontender, no hepatosplenomegaly, no distention.  Skin: Intact without lesions or rashes.  Neurologic: Alert and oriented x 3.  Psych: Normal affect. Extremities: No clubbing or cyanosis.  HEENT: Normal.   Assessment/Plan: 1. Chronic systolic CHF: Cardiomyopathy of uncertain etiology.  Echoes in 3/21 and 5/21 showed EF 35-40%.  No cath done due to CKD stage 4 and no ACS. She has not had symptoms concerning for ischemia.  She did have flu-like symptoms prior to 3/21 CHF admission so viral myocarditis is a concern.  No significant family history of cardiomyopathy and no concerning substance use.  She does not have PVCs on ECG today.  NYHA class II symptoms, REDS clip does not suggest significant volume overload.  GDMT limited by CKD stage 4.  - Continue Toprol XL 75 mg daily.  - Continue Imdur  30 mg daily and increase hydralazine to 25 mg tid.  - No ARNI/ARB/ACEI/spironolactone with CKD stage 4.  - No SGLT2 inhibitor as she has tolerated neither Jardiance nor Farxiga in the past.  - She does not appear to need a loop diureetic.  - Needs repeat evaluation of EF, will arrange for echo.  - Will also try to get cardiac MRI for her, can give low dose MRI contrast and look for any scarring pattern on delayed enhancement imaging to give [place] clues as to the etiology of  her cardiomyopathy.  2. CKD stage 4: BMET today.   3. HTN: BP is controlled.   [place] 05/01/2022

## 2022-05-04 ENCOUNTER — Ambulatory Visit (HOSPITAL_COMMUNITY)
Admission: RE | Admit: 2022-05-04 | Discharge: 2022-05-04 | Disposition: A | Discharge status: Home / Self Care | Payer: Medicare Other | Source: Ambulatory Visit | Attending: Family Medicine | Admitting: Family Medicine

## 2022-05-04 ENCOUNTER — Encounter (HOSPITAL_COMMUNITY): Payer: Self-pay

## 2022-05-04 ENCOUNTER — Ambulatory Visit (HOSPITAL_BASED_OUTPATIENT_CLINIC_OR_DEPARTMENT_OTHER)
Admission: RE | Admit: 2022-05-04 | Discharge: 2022-05-04 | Disposition: A | Discharge status: Home / Self Care | Payer: Medicare Other | Source: Ambulatory Visit | Attending: Family Medicine | Admitting: Family Medicine

## 2022-05-04 VITALS — BP 142/88 | Wt 275.6 lb

## 2022-05-04 DIAGNOSIS — N184 Chronic kidney disease, stage 4 (severe): Secondary | ICD-10-CM | POA: Diagnosis not present

## 2022-05-04 DIAGNOSIS — I5042 Chronic combined systolic (congestive) and diastolic (congestive) heart failure: Secondary | ICD-10-CM

## 2022-05-04 DIAGNOSIS — I3481 Nonrheumatic mitral (valve) annulus calcification: Secondary | ICD-10-CM | POA: Diagnosis not present

## 2022-05-04 DIAGNOSIS — I429 Cardiomyopathy, unspecified: Secondary | ICD-10-CM | POA: Diagnosis not present

## 2022-05-04 DIAGNOSIS — E1122 Type 2 diabetes mellitus with diabetic chronic kidney disease: Secondary | ICD-10-CM | POA: Diagnosis not present

## 2022-05-04 DIAGNOSIS — I10 Essential (primary) hypertension: Secondary | ICD-10-CM

## 2022-05-04 DIAGNOSIS — Z79899 Other long term (current) drug therapy: Secondary | ICD-10-CM | POA: Insufficient documentation

## 2022-05-04 DIAGNOSIS — I130 Hypertensive heart and chronic kidney disease with heart failure and stage 1 through stage 4 chronic kidney disease, or unspecified chronic kidney disease: Secondary | ICD-10-CM | POA: Insufficient documentation

## 2022-05-04 DIAGNOSIS — E7800 Pure hypercholesterolemia, unspecified: Secondary | ICD-10-CM

## 2022-05-04 DIAGNOSIS — E785 Hyperlipidemia, unspecified: Secondary | ICD-10-CM | POA: Diagnosis not present

## 2022-05-04 DIAGNOSIS — I5022 Chronic systolic (congestive) heart failure: Secondary | ICD-10-CM | POA: Insufficient documentation

## 2022-05-04 LAB — COMPREHENSIVE METABOLIC PANEL
ALT: 17 U/L (ref 0–44)
AST: 21 U/L (ref 15–41)
Albumin: 3.7 g/dL (ref 3.5–5.0)
Alkaline Phosphatase: 46 U/L (ref 38–126)
Anion gap: 13 (ref 5–15)
BUN: 46 mg/dL — ABNORMAL HIGH (ref 8–23)
CO2: 23 mmol/L (ref 22–32)
Calcium: 8.7 mg/dL — ABNORMAL LOW (ref 8.9–10.3)
Chloride: 103 mmol/L (ref 98–111)
Creatinine, Ser: 2.89 mg/dL — ABNORMAL HIGH (ref 0.44–1.00)
GFR, Estimated: 17 mL/min — ABNORMAL LOW (ref >60)
Glucose, Bld: 209 mg/dL — ABNORMAL HIGH (ref 70–99)
Potassium: 3.6 mmol/L (ref 3.5–5.1)
Sodium: 139 mmol/L (ref 135–145)
Total Bilirubin: 0.6 mg/dL (ref 0.3–1.2)
Total Protein: 6.6 g/dL (ref 6.5–8.1)

## 2022-05-04 LAB — ECHOCARDIOGRAM COMPLETE
AR max vel: 2.51 cm2
AV Peak grad: 6 mmHg
Ao pk vel: 1.23 m/s
Area-P 1/2: 3.81 cm2
S' Lateral: 4.1 cm
Single Plane A4C EF: 38.8 %

## 2022-05-04 LAB — BRAIN NATRIURETIC PEPTIDE: B Natriuretic Peptide: 98.8 pg/mL (ref 0.0–100.0)

## 2022-05-04 NOTE — Patient Instructions (Signed)
Thank you for coming in today  Labs were done today, if any labs are abnormal the clinic will call you No news is good news  EKG done today  .Your physician recommends that you schedule a follow-up appointment in:  3 months with Dr. Aundra Dubin please call November 2023 for appointment for 3 months from today    Do the following things EVERYDAY: Weigh yourself in the morning before breakfast. Write it down and keep it in a log. Take your medicines as prescribed Eat low salt foods--Limit salt (sodium) to 2000 mg per day.  Stay as active as you can everyday Limit all fluids for the day to less than 2 liters  At the [facility], you and your health needs are our priority. As part of our continuing mission to provide you with exceptional heart care, we have created designated Provider Care Teams. These Care Teams include your primary Cardiologist (physician) and Advanced Practice Providers (APPs- Physician Assistants and Nurse Practitioners) who all work together to provide you with the care you need, when you need it.   You may see any of the following providers on your designated Care Team at your next follow up: Dr Glori Bickers Dr Loralie Champagne Dr. Roxana Hires, NP Lyda Jester, [place] [facility] [place], [place] Forestine Na, NP Audry Riles, PharmD   Please be sure to bring in all your medications bottles to every appointment.   If you have any questions or concerns before your next appointment please send [place] a message through [place] or call our office at [phone].    TO LEAVE A MESSAGE FOR THE NURSE SELECT OPTION 2, PLEASE LEAVE A MESSAGE INCLUDING: YOUR NAME DATE OF BIRTH CALL BACK NUMBER REASON FOR CALL**this is important as we prioritize the call backs  YOU WILL RECEIVE A CALL BACK THE SAME DAY AS LONG AS YOU CALL BEFORE 4:00 PM

## 2022-05-05 ENCOUNTER — Other Ambulatory Visit: Payer: Self-pay | Admitting: Physician Assistant

## 2022-05-05 ENCOUNTER — Other Ambulatory Visit: Payer: Self-pay | Admitting: *Deleted

## 2022-05-05 MED ORDER — TORSEMIDE 20 MG PO TABS: 40.0000 mg | ORAL_TABLET | Freq: Every day | ORAL | 3 refills | Status: DC

## 2022-05-12 ENCOUNTER — Encounter (HOSPITAL_COMMUNITY): Payer: Self-pay

## 2022-06-09 ENCOUNTER — Telehealth (HOSPITAL_COMMUNITY): Payer: Self-pay | Admitting: Emergency Medicine

## 2022-06-09 NOTE — Telephone Encounter (Signed)
Attempted to call patient regarding upcoming cardiac MR appointment. Left message on voicemail with name and callback number Ruby Dilone RN Navigator Cardiac Imaging [facility] [phone] Office [phone] Cell  

## 2022-06-09 NOTE — Telephone Encounter (Signed)
Reaching out to patient to offer assistance regarding upcoming cardiac imaging study; pt verbalizes understanding of appt date/time, parking situation and where to check in, pre-test NPO status and medications ordered, and verified current allergies; name and call back number provided for further questions should they arise Marchia Bond RN Navigator Cardiac Imaging [facility] [phone] office [phone] cell  Deep, rolling veins Arrival [address] entrance Denies metal implants Reports claustro- trying to get verbal order for antianxiety meds

## 2022-06-10 ENCOUNTER — Ambulatory Visit (HOSPITAL_COMMUNITY)
Admission: RE | Admit: 2022-06-10 | Discharge: 2022-06-10 | Disposition: A | Discharge status: Home / Self Care | Payer: Medicare Other | Source: Ambulatory Visit | Attending: Cardiology | Admitting: Cardiology

## 2022-06-10 ENCOUNTER — Other Ambulatory Visit (HOSPITAL_COMMUNITY): Payer: Self-pay | Admitting: Cardiology

## 2022-06-10 DIAGNOSIS — I5042 Chronic combined systolic (congestive) and diastolic (congestive) heart failure: Secondary | ICD-10-CM

## 2022-06-10 MED ORDER — LORAZEPAM 2 MG/ML IJ SOLN
1.0000 mg | Freq: Once | INTRAMUSCULAR | Status: AC
  Filled 2022-06-10: qty 0.5

## 2022-06-10 MED ORDER — GADOBUTROL 1 MMOL/ML IV SOLN
10.0000 mL | Freq: Once | INTRAVENOUS | Status: AC | PRN
  Administered 2022-06-10: 10 mL via INTRAVENOUS

## 2022-06-10 MED ORDER — LORAZEPAM 2 MG/ML IJ SOLN
INTRAMUSCULAR | Status: AC
  Administered 2022-06-10: 1 mg via INTRAVENOUS
  Filled 2022-06-10: qty 1

## 2022-06-10 MED ORDER — DIAZEPAM 5 MG/ML IJ SOLN: 2.5000 mg | Freq: Once | INTRAMUSCULAR | Status: DC

## 2022-06-18 ENCOUNTER — Encounter (HOSPITAL_COMMUNITY): Payer: Self-pay

## 2022-07-02 ENCOUNTER — Encounter (HOSPITAL_COMMUNITY): Payer: Self-pay

## 2022-07-09 ENCOUNTER — Other Ambulatory Visit: Payer: Self-pay | Admitting: Physician Assistant

## 2022-07-09 NOTE — Telephone Encounter (Signed)
Rx refill sent to pharmacy. 

## 2022-08-08 ENCOUNTER — Other Ambulatory Visit: Payer: Self-pay | Admitting: Cardiovascular Disease

## 2022-09-29 ENCOUNTER — Encounter: Payer: Self-pay | Admitting: Cardiovascular Disease

## 2022-09-29 NOTE — Progress Notes (Unsigned)
Cardiology Office Note:    Date:  09/30/2022   ID:  Sheila Leach, DOB January 23, 1956, MRN [id number]  PCP:  Shon Baton, MD  [facility] Cardiologist:  Mertie Moores, MD  [facility] Electrophysiologist:  None   Referring MD: Shon Baton, MD   Chief Complaint  Patient presents with   Congestive Heart Failure      History of Present Illness:    Sheila Leach is a 67 y.o. female with a hx of hypertension, hyperlipidemia, obesity, chronic kidney disease who I met in the hospital in March, 2021 when she presented with congestive heart failure.  Echocardiogram revealed moderate LV dysfunction with EF of 35 to 40%.  We were not able to determine diastolic parameters.  She had moderately elevated pulmonary artery pressures.  Mild mitral regurgitation.  She was seen in our office in April by Richardson Dopp, PA.  She had lost quite a bit of weight.  She gone from 314 pounds down to 274 pounds.  She is not had any any significant shortness of breath.  She denies any syncope.  She has been seeing nephrology.  Since that time she is done well.  Her weight has continued to decrease.  She is not had any episodes of shortness of breath.  No CP  She is on hydralazine 25 3 times a day, isosorbide 30 mg a day, Toprol-XL 50 mg a day, torsemide 40 mg a day. She watches her salt Is now waking , Creatinine is 2.56.  We have not tried her on ARB, entresto  or spironolactone.  Echocardiogram in May revealed no change in LV function.  Revealed a left pleural effusion.  Chest  x-ray from January 03, 2020 reveals no significant pleural effusion.  We are avoiding ischemic work-up, especially heart catheterization because of her elevated creatinine.  She does not want to go on dialysis.  I think that we will wait and consider doing a heart catheterization if at some point she goes on dialysis.  She is currently not having any episodes of angina.  Feb. 15, 2023: Jewel is seen today for follow up of her CHF. Wt is  296 lbs ( up 22 lbs)  - has been been getting into the "junk food" according to her   Still gets DOE if walks too much - its unclear if this DOE is related to worsening CHF vs. Her weight gain .  Tried Jardiance - caused nausea Is now on Farxiga 5 mg a day   September 30, 2022: Sheila Leach is seen for follow-up of her congestive heart failure.  Her weight is 261 lbs  She has been seen by Dr. Aundra Leach . Is here today for follow up to see me   She has a history of heart failure with reduced LV function. She also has significant chronic kidney disease.  Eating a low salt diet   No CP , no dyspnea  Does not get any significant  exercise  - is limited by her severe back issues   She is in atrial fib with RVR today . She cannot tell that her HR is irreg.   Has hypothyroidism,  is on synthroid Managed by Dr. Virgina Leach  EF was 35-40%   Will go ahead and start her on Eliquis 2.5 mg twice a day.  Will increase her metoprolol XL to 150 mg a day.  I will have her follow-up with the A-fib clinic next week.  If she clinically is doing better with a better  controlled heart rate then we might be able to delay cardioversion for several weeks.  If she continues to have a rapid ventricular response will probably need to consider TEE cardioversion soon.      Past Medical History:  Diagnosis Date   Asthma    inhaler prn   Bilateral ureteral calculi    10-21-2017 currently has retained right ureteral stent and left nephrostomy tube in place   [facility]cer [facility])    uterine tumor   Chronic kidney disease stage 4 0000000   Chronic systolic CHF 0000000   Echocardiogram 10/2019: EF 35-40, mild LVE, global HK, mod elevated RVSP (41.4), mild MR   // Echo 12/2019: EF 35-40, global HK, GRII DD, GLS -12.2%, moderate left pleural effusion    Complication of anesthesia    trouble breathing after anesthesia didnt know had asthma at the time   DDD (degenerative disc disease), lumbar    pt adenies at preop   History  of acute renal failure 09/19/2017   hospital admission --- hydronephrosis ,  ureteral stones   History of endometrial cancer previous oncolgoist-- dr Alycia Rossetti--- pt was released , no recurrence   2010  dx endometrial cancer--- 11-27-2008 s/p  TAH w/ BSO with bilateral pelvic node dissection's--- Stage IB, 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 , negative nodes, no chemo , completed post operative vaginal cuff brachii therapy 07/ 2010   History of kidney stones    Hx of radiation therapy 6/6/, 6/21, 6/24, 02/07/2009   post operative vaginal cuff branii therapy   Hyperlipidemia    Hypertension    10-21-2017  per pt was on lisinopril prior to hospital admission 02/ 2019 she become hypotensive so lisinopril was discontinued   Hypothyroidism, postsurgical FOLLOWED BY PCP   10-21-2017 per pt dx thyroid goiter at age 67 (age 62 s/p  partial thyroidectomy), age 39 thryoid greatly increased in size felt to be pre-cancerous (s/p  total thyroidectomy)  no radiation or chemo,  no recurrence   Left sided sciatica    10-21-2017  per pt nerve injury during hysterectomy surgery 04/ 2010  ,  left leg weaker than right but gait issue  pt. denies   Osteoporosis    Other secondary pulmonary hypertension ([place]) 11/27/2019   Echocardiogram 10/2019: RVSP 41.4 // likely multifactorial (obesity, acute CHF,?OSA)   Personal history of colonic adenomas 01/24/2013   Seasonal allergies     Past Surgical History:  Procedure Laterality Date   CESAREAN SECTION  1988   CYSTOSCOPY W/ URETERAL STENT PLACEMENT Bilateral 09/19/2017   Procedure: CYSTOSCOPY WITH BILATERAL RETROGRADE PYELOGRAM/RIGHT URETERAL STENT PLACEMENT;  Surgeon: Cleon Gustin, MD;  Location: [facility];  Service: Urology;  Laterality: Bilateral;   CYSTOSCOPY WITH RETROGRADE PYELOGRAM, URETEROSCOPY AND STENT PLACEMENT Bilateral 10/29/2017   Procedure: CYSTOSCOPY WITH RETROGRADE PYELOGRAM, URETEROSCOPY AND STENT PLACEMENT, REMOVAL LEFT NEPHROSTOMY  TUBE;  Surgeon: Cleon Gustin, MD;  [facility];  Service: Urology;  Laterality: Bilateral;   DILATION AND CURETTAGE OF UTERUS  1996   w/ resectoscopic resection fibroids and Dx Laparoscopy   HOLMIUM LASER APPLICATION Bilateral 0000000   Procedure: HOLMIUM LASER APPLICATION;  Surgeon: Cleon Gustin, MD;  Location: [facility];  Service: Urology;  Laterality: Bilateral;   IR NEPHROSTOMY PLACEMENT LEFT  09/20/2017   PARATHYROIDECTOMY Left 05/12/2019   Procedure: LEFT SUPERIOR PARATHYROIDECTOMY;  Surgeon: Armandina Gemma, MD;  Location: [facility];  Service: General;  Laterality: Left;   THYROIDECTOMY, PARTIAL  age 12  TONSILLECTOMY  age 49   TOTAL ABDOMINAL HYSTERECTOMY W/ BILATERAL SALPINGOOPHORECTOMY  11-27-2008   dr Alycia Rossetti  [facility]   w/ Bilateral Pelvic Lymph Node Dissection's    TOTAL THYROIDECTOMY  age 3   pre -cancerous   TRANSTHORACIC ECHOCARDIOGRAM  09/21/2017   mild concentric LVH, ef 50-55%/  trivial MR and TR    Current Medications: Current Meds  Medication Sig   acetaminophen (TYLENOL) 500 MG tablet Take 1,000 mg by mouth every 6 (six) hours as needed for moderate pain.   albuterol (PROVENTIL HFA;VENTOLIN HFA) 108 (90 BASE) MCG/ACT inhaler Inhale 2 puffs into the lungs every 6 (six) hours as needed for wheezing or shortness of breath.   apixaban (ELIQUIS) 2.5 MG TABS tablet Take 1 tablet (2.5 mg total) by mouth 2 (two) times daily.   cetirizine (ZYRTEC) 10 MG tablet Take 10 mg by mouth in the morning.    fenofibrate 160 MG tablet Take 160 mg by mouth every morning.    hydrALAZINE (APRESOLINE) 25 MG tablet Take 1 tablet (25 mg total) by mouth 3 (three) times daily.   isosorbide mononitrate (IMDUR) 30 MG 24 hr tablet Take 1 tablet by mouth once daily   levothyroxine (SYNTHROID) 175 MCG tablet Take 350 mcg by mouth daily before breakfast.    metoprolol succinate (TOPROL XL) 100 MG 24 hr tablet Take 1.5 tablets (150 mg total) by mouth  daily. Take with or immediately following a meal.   Multiple Vitamins-Minerals (MULTIVITAMIN WITH MINERALS) tablet Take 1 tablet by mouth daily.   ondansetron (ZOFRAN ODT) 4 MG disintegrating tablet Take 1 tablet (4 mg total) by mouth every 8 (eight) hours as needed for nausea or vomiting.   polyethylene glycol (MIRALAX / GLYCOLAX) packet Take 17 g by mouth daily as needed for moderate constipation.   sodium bicarbonate 650 MG tablet Take 1,300 mg by mouth daily.   torsemide (DEMADEX) 20 MG tablet Take 2 tablets (40 mg total) by mouth daily. Take extra 2 pills a day only if you have weight gain more than 3 pounds overnight or 5 pounds in a week or worsening shortness of breath or pedal edema...   [DISCONTINUED] metoprolol succinate (TOPROL-XL) 50 MG 24 hr tablet TAKE 1 & 1/2 (ONE & ONE-HALF) TABLETS BY MOUTH ONCE DAILY WITH FOOD OR IMMEDIATELY FOLLOWING A MEAL     Allergies:   Betadine [povidone iodine], Contrast media [iodinated contrast media], Other, Empagliflozin, Ibuprofen, Iodine, and Adhesive [tape]   Social History   Socioeconomic History   Marital status: Divorced    Spouse name: Not on file   Number of children: Not on file   Years of education: Not on file   Highest education level: Not on file  Occupational History   Not on file  Tobacco Use   Smoking status: Never   Smokeless tobacco: Never  Vaping Use   Vaping Use: Never used  Substance and Sexual Activity   Alcohol use: No   Drug use: No   Sexual activity: Not Currently  Other Topics Concern   Not on file  Social History Narrative   Not on file   Social Determinants of Health   Financial Resource Strain: Not on file  Food Insecurity: No Food Insecurity (05/04/2022)   Hunger Vital Sign    Worried About Running Out of Food in the Last Year: Never true    Ran Out of Food in the Last Year: Never true  Transportation Needs: No Transportation Needs (05/04/2022)   [place] - Transportation  Lack of Transportation  (Medical): No    Lack of Transportation (Non-Medical): No  Physical Activity: Not on file  Stress: Not on file  Social Connections: Not on file     Family History: The patient's family history includes Breast cancer in an other family member; Diabetes in her brother and mother; Esophageal cancer in her brother; Heart disease in her father and mother; Kidney disease in her mother; Liver disease in her brother; Thyroid cancer in her sister. There is no history of Colon cancer, Rectal cancer, or Stomach cancer.  ROS:   Please see the history of present illness.     All other systems reviewed and are negative.  EKGs/Labs/Other Studies Reviewed:    The following studies were reviewed today:   EKG:   September 30, 2022: Atrial fibrillation with a rapid ventricular response.  Nonspecific ST and T wave abnormalities.  Heart rate is 155.  Recent Labs: 03/20/2022: Hemoglobin 13.0; Platelets 379 05/04/2022: ALT 17; B Natriuretic Peptide 98.8; BUN 46; Creatinine, Ser 2.89; Potassium 3.6; Sodium 139  Recent Lipid Panel    Component Value Date/Time   CHOL 276 (H) 03/20/2022 1239   TRIG 565 (H) 03/20/2022 1239   HDL 32 (L) 03/20/2022 1239   CHOLHDL 8.6 03/20/2022 1239   VLDL UNABLE TO CALCULATE IF TRIGLYCERIDE OVER 400 mg/dL 03/20/2022 1239   LDLCALC UNABLE TO CALCULATE IF TRIGLYCERIDE OVER 400 mg/dL 03/20/2022 1239   LDLDIRECT 148 (H) 03/20/2022 1239    Physical Exam:    Physical Exam: Blood pressure (!) 150/90, pulse (!) 155, height '5\' 7"'$  (1.702 m), weight 261 lb 6.4 oz (118.6 kg), SpO2 97 %.  HYPERTENSION CONTROL Vitals:   09/30/22 1354 09/30/22 1400  BP: (!) 150/90 (!) 150/90    The patient's blood pressure is elevated above target today.  In order to address the patient's elevated BP: A current anti-hypertensive medication was adjusted today.       GEN:  morbidly obese female,  NAD  in no acute distress HEENT: Normal NECK: No JVD; No carotid bruits LYMPHATICS: No  lymphadenopathy CARDIAC: irreg. Irreg.  Tachycardic  RESPIRATORY:  Clear to auscultation without rales, wheezing or rhonchi  ABDOMEN: Soft, non-tender, non-distended MUSCULOSKELETAL:  No edema; No deformity  SKIN: Warm and dry NEUROLOGIC:  Alert and oriented x 3   ASSESSMENT:    1. Chronic combined systolic and diastolic CHF (congestive heart failure) ([place])   2. Essential hypertension   3. Persistent atrial fibrillation (HCC)      PLAN:      Chronic combined systolic and diastolic congestive heart failure:  Echocardiogram last October revealed an LVEF of 35 to 40%.  She now presents with rapid atrial fibrillation.    2.  Hypertension: cont meds.   3.  Atrial fib:   Will go ahead and start her on Eliquis 2.5 mg twice a day.  Will increase her metoprolol XL to 150 mg a day.  I will have her follow-up with the A-fib clinic next week.  If she clinically is doing better with a better controlled heart rate then we might be able to delay cardioversion for several weeks.  If she continues to have a rapid ventricular response will probably need to consider TEE cardioversion soon.   Medication Adjustments/Labs and Tests Ordered: Current medicines are reviewed at length with the patient today.  Concerns regarding medicines are outlined above.  Orders Placed This Encounter  Procedures   TSH   Amb Referral to AFIB Clinic   EKG  12-Lead   Meds ordered this encounter  Medications   apixaban (ELIQUIS) 2.5 MG TABS tablet    Sig: Take 1 tablet (2.5 mg total) by mouth 2 (two) times daily.    Dispense:  180 tablet    Refill:  3   metoprolol succinate (TOPROL XL) 100 MG 24 hr tablet    Sig: Take 1.5 tablets (150 mg total) by mouth daily. Take with or immediately following a meal.    Dispense:  135 tablet    Refill:  3     Patient Instructions  Medication Instructions:  Your physician has recommended you make the following change in your medication:   Increase Toprol XL to '150mg'$   daily 2. Start Eliquis 2.5 mg 2 times daily   *If you need a refill on your cardiac medications before your next appointment, please call your pharmacy*   Lab Work: TSH  If you have labs (blood work) drawn today and your tests are completely normal, you will receive your results only by: [place] (if you have MyChart) OR A paper copy in the mail If you have any lab test that is abnormal or we need to change your treatment, we will call you to review the results.   Follow-Up: At [facility], you and your health needs are our priority.  As part of our continuing mission to provide you with exceptional heart care, we have created designated Provider Care Teams.  These Care Teams include your primary Cardiologist (physician) and Advanced Practice Providers (APPs -  Physician Assistants and Nurse Practitioners) who all work together to provide you with the care you need, when you need it.  We recommend signing up for the patient portal called "MyChart".  Sign up information is provided on this After Visit Summary.  MyChart is used to connect with patients for Virtual Visits (Telemedicine).  Patients are able to view lab/test results, encounter notes, upcoming appointments, etc.  Non-urgent messages can be sent to your provider as well.   To learn more about what you can do with MyChart, go to [email or web address].    Your next appointment:   3 month(s)  Provider:   Mertie Moores, MD       Signed, Mertie Moores, MD  09/30/2022 5:12 PM    [place]

## 2022-09-30 ENCOUNTER — Encounter: Payer: Self-pay | Admitting: Cardiovascular Disease

## 2022-09-30 ENCOUNTER — Ambulatory Visit: Payer: Medicare Other | Attending: Cardiovascular Disease | Admitting: Cardiovascular Disease

## 2022-09-30 VITALS — BP 150/90 | HR 155 | Ht 67.0 in | Wt 261.4 lb

## 2022-09-30 DIAGNOSIS — I4819 Other persistent atrial fibrillation: Secondary | ICD-10-CM | POA: Diagnosis not present

## 2022-09-30 DIAGNOSIS — I10 Essential (primary) hypertension: Secondary | ICD-10-CM

## 2022-09-30 DIAGNOSIS — I5042 Chronic combined systolic (congestive) and diastolic (congestive) heart failure: Secondary | ICD-10-CM | POA: Diagnosis not present

## 2022-09-30 DIAGNOSIS — I4891 Unspecified atrial fibrillation: Secondary | ICD-10-CM | POA: Insufficient documentation

## 2022-09-30 MED ORDER — METOPROLOL SUCCINATE ER 100 MG PO TB24: 150.0000 mg | ORAL_TABLET | Freq: Every day | ORAL | 3 refills | Status: DC

## 2022-09-30 MED ORDER — APIXABAN 2.5 MG PO TABS: 2.5000 mg | ORAL_TABLET | Freq: Two times a day (BID) | ORAL | 3 refills | Status: DC

## 2022-09-30 NOTE — Patient Instructions (Signed)
Medication Instructions:  Your physician has recommended you make the following change in your medication:   Increase Toprol XL to '150mg'$  daily 2. Start Eliquis 2.5 mg 2 times daily   *If you need a refill on your cardiac medications before your next appointment, please call your pharmacy*   Lab Work: TSH  If you have labs (blood work) drawn today and your tests are completely normal, you will receive your results only by: [place] (if you have MyChart) OR A paper copy in the mail If you have any lab test that is abnormal or we need to change your treatment, we will call you to review the results.   Follow-Up: At [facility], you and your health needs are our priority.  As part of our continuing mission to provide you with exceptional heart care, we have created designated Provider Care Teams.  These Care Teams include your primary Cardiologist (physician) and Advanced Practice Providers (APPs -  Physician Assistants and Nurse Practitioners) who all work together to provide you with the care you need, when you need it.  We recommend signing up for the patient portal called "MyChart".  Sign up information is provided on this After Visit Summary.  MyChart is used to connect with patients for Virtual Visits (Telemedicine).  Patients are able to view lab/test results, encounter notes, upcoming appointments, etc.  Non-urgent messages can be sent to your provider as well.   To learn more about what you can do with MyChart, go to [email or web address].    Your next appointment:   3 month(s)  Provider:   Mertie Moores, MD

## 2022-10-01 LAB — TSH: TSH: 16.7 u[IU]/mL — ABNORMAL HIGH (ref 0.450–4.500)

## 2022-10-05 ENCOUNTER — Ambulatory Visit (HOSPITAL_COMMUNITY)
Admission: RE | Admit: 2022-10-05 | Discharge: 2022-10-05 | Disposition: A | Discharge status: Home / Self Care | Payer: Medicare Other | Source: Ambulatory Visit | Attending: Physician Assistant | Admitting: Physician Assistant

## 2022-10-05 ENCOUNTER — Inpatient Hospital Stay (HOSPITAL_COMMUNITY)
Admission: RE | Admit: 2022-10-05 | Discharge: 2022-10-05 | Disposition: A | Discharge status: Home / Self Care | Payer: Medicare Other | Source: Ambulatory Visit | Attending: Physician Assistant | Admitting: Physician Assistant

## 2022-10-05 VITALS — BP 136/96 | HR 79 | Ht 67.0 in | Wt 259.2 lb

## 2022-10-05 DIAGNOSIS — E669 Obesity, unspecified: Secondary | ICD-10-CM | POA: Diagnosis not present

## 2022-10-05 DIAGNOSIS — I480 Paroxysmal atrial fibrillation: Secondary | ICD-10-CM

## 2022-10-05 DIAGNOSIS — Z6841 Body Mass Index (BMI) 40.0 and over, adult: Secondary | ICD-10-CM | POA: Insufficient documentation

## 2022-10-05 DIAGNOSIS — N184 Chronic kidney disease, stage 4 (severe): Secondary | ICD-10-CM | POA: Diagnosis not present

## 2022-10-05 DIAGNOSIS — I4891 Unspecified atrial fibrillation: Secondary | ICD-10-CM

## 2022-10-05 DIAGNOSIS — E1122 Type 2 diabetes mellitus with diabetic chronic kidney disease: Secondary | ICD-10-CM | POA: Diagnosis not present

## 2022-10-05 DIAGNOSIS — Z79899 Other long term (current) drug therapy: Secondary | ICD-10-CM | POA: Diagnosis not present

## 2022-10-05 DIAGNOSIS — I130 Hypertensive heart and chronic kidney disease with heart failure and stage 1 through stage 4 chronic kidney disease, or unspecified chronic kidney disease: Secondary | ICD-10-CM | POA: Diagnosis not present

## 2022-10-05 DIAGNOSIS — Z7901 Long term (current) use of anticoagulants: Secondary | ICD-10-CM | POA: Insufficient documentation

## 2022-10-05 DIAGNOSIS — E039 Hypothyroidism, unspecified: Secondary | ICD-10-CM | POA: Diagnosis not present

## 2022-10-05 DIAGNOSIS — E785 Hyperlipidemia, unspecified: Secondary | ICD-10-CM | POA: Insufficient documentation

## 2022-10-05 DIAGNOSIS — D6869 Other thrombophilia: Secondary | ICD-10-CM | POA: Diagnosis not present

## 2022-10-05 DIAGNOSIS — I5022 Chronic systolic (congestive) heart failure: Secondary | ICD-10-CM | POA: Diagnosis not present

## 2022-10-05 NOTE — Progress Notes (Signed)
Primary Care Physician: Shon Baton, MD Primary Cardiologist: Dr Acie Fredrickson Primary Electrophysiologist: none Referring Physician: Dr Acie Fredrickson [facility]: Dr Aundra Dubin   Sheila Leach is a 67 y.o. female with a history of CKD stage IV, DM, HTN, chronic systolic CHF, HLD, endometrial cancer, hypothyroidism, atrial fibrillation who presents for consultation in the [facility].  The patient was initially diagnosed with atrial fibrillation 09/30/22 after presenting to Dr Elmarie Shiley office for follow up. Her heart rate was 150's but she was unaware of her arrhythmia. Patient was started on Eliquis for a CHADS2VASC score of 5 and her metoprolol was increased.   Today, she appears to be doing well overall. Her ECG today shows she is back in SR. She cannot feel when she is in AF and denies any fatigue, palpitations, shortness of breath, or chest pain. She has been compliant with eliquis 2.5 mg bid and metoprolol. She has not yet taken the increased dosage of metoprolol. No bleeding issues at this time. She does express a lot of reluctance taking an anticoagulant as she had a bad experience with her mother having a bleeding complication on anticoagulant.  Today, she denies symptoms of palpitations, chest pain, shortness of breath, orthopnea, PND, lower extremity edema, dizziness, presyncope, syncope, snoring, daytime somnolence, bleeding, or neurologic sequela. The patient is tolerating medications without difficulties and is otherwise without complaint today.    Atrial Fibrillation Risk Factors:  she does not have a history of rheumatic fever. she does not have a history of alcohol use. The patient does not have a history of early familial atrial fibrillation or other arrhythmias.  she has a BMI of Body mass index is 40.6 kg/m.Marland Kitchen Filed Weights   10/05/22 1534  Weight: 117.6 kg    Family History  Problem Relation Age of Onset   Heart disease Mother    Diabetes Mother    Kidney  disease Mother    Heart disease Father    Thyroid cancer Sister    Esophageal cancer Brother    Diabetes Brother    Liver disease Brother    Breast cancer Other    Colon cancer Neg Hx    Rectal cancer Neg Hx    Stomach cancer Neg Hx      Atrial Fibrillation Management history:  Previous antiarrhythmic drugs: none Previous cardioversions: none Previous ablations: none CHADS2VASC score: 5 Anticoagulation history: Eliquis   Past Medical History:  Diagnosis Date   Asthma    inhaler prn   Bilateral ureteral calculi    10-21-2017 currently has retained right ureteral stent and left nephrostomy tube in place   Cancer [facility]    uterine tumor   Chronic kidney disease stage 4 0000000   Chronic systolic CHF 0000000   Echocardiogram 10/2019: EF 35-40, mild LVE, global HK, mod elevated RVSP (41.4), mild MR   // Echo 12/2019: EF 35-40, global HK, GRII DD, GLS -12.2%, moderate left pleural effusion    Complication of anesthesia    trouble breathing after anesthesia didnt know had asthma at the time   DDD (degenerative disc disease), lumbar    pt adenies at preop   History of acute renal failure 09/19/2017   hospital admission --- hydronephrosis ,  ureteral stones   History of endometrial cancer previous oncolgoist-- dr Alycia Rossetti--- pt was released , no recurrence   2010  dx endometrial cancer--- 11-27-2008 s/p  TAH w/ BSO with bilateral pelvic node dissection's--- Stage IB, 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 ,  negative nodes, no chemo , completed post operative vaginal cuff brachii therapy 07/ 2010   History of kidney stones    Hx of radiation therapy 6/6/, 6/21, 6/24, 02/07/2009   post operative vaginal cuff branii therapy   Hyperlipidemia    Hypertension    10-21-2017  per pt was on lisinopril prior to hospital admission 02/ 2019 she become hypotensive so lisinopril was discontinued   Hypothyroidism, postsurgical FOLLOWED BY PCP   10-21-2017 per pt dx thyroid  goiter at age 5 (age 10 s/p  partial thyroidectomy), age 12 thryoid greatly increased in size felt to be pre-cancerous (s/p  total thyroidectomy)  no radiation or chemo,  no recurrence   Left sided sciatica    10-21-2017  per pt nerve injury during hysterectomy surgery 04/ 2010  ,  left leg weaker than right but gait issue  pt. denies   Osteoporosis    Other secondary pulmonary hypertension ([place]) 11/27/2019   Echocardiogram 10/2019: RVSP 41.4 // likely multifactorial (obesity, acute CHF,?OSA)   Personal history of colonic adenomas 01/24/2013   Seasonal allergies    Past Surgical History:  Procedure Laterality Date   CESAREAN SECTION  1988   CYSTOSCOPY W/ URETERAL STENT PLACEMENT Bilateral 09/19/2017   Procedure: CYSTOSCOPY WITH BILATERAL RETROGRADE PYELOGRAM/RIGHT URETERAL STENT PLACEMENT;  Surgeon: Cleon Gustin, MD;  Location: [facility];  Service: Urology;  Laterality: Bilateral;   CYSTOSCOPY WITH RETROGRADE PYELOGRAM, URETEROSCOPY AND STENT PLACEMENT Bilateral 10/29/2017   Procedure: CYSTOSCOPY WITH RETROGRADE PYELOGRAM, URETEROSCOPY AND STENT PLACEMENT, REMOVAL LEFT NEPHROSTOMY TUBE;  Surgeon: Cleon Gustin, MD;  Location: [facility];  Service: Urology;  Laterality: Bilateral;   DILATION AND CURETTAGE OF UTERUS  1996   w/ resectoscopic resection fibroids and Dx Laparoscopy   HOLMIUM LASER APPLICATION Bilateral 0000000   Procedure: HOLMIUM LASER APPLICATION;  Surgeon: Cleon Gustin, MD;  Location: [facility];  Service: Urology;  Laterality: Bilateral;   IR NEPHROSTOMY PLACEMENT LEFT  09/20/2017   PARATHYROIDECTOMY Left 05/12/2019   Procedure: LEFT SUPERIOR PARATHYROIDECTOMY;  Surgeon: Armandina Gemma, MD;  Location: [facility];  Service: General;  Laterality: Left;   THYROIDECTOMY, PARTIAL  age 20   TONSILLECTOMY  age 89   TOTAL ABDOMINAL HYSTERECTOMY W/ BILATERAL SALPINGOOPHORECTOMY  11-27-2008   dr Alycia Rossetti  [facility]   w/ Bilateral Pelvic Lymph Node  Dissection's    TOTAL THYROIDECTOMY  age 89   pre -cancerous   TRANSTHORACIC ECHOCARDIOGRAM  09/21/2017   mild concentric LVH, ef 50-55%/  trivial MR and TR    Current Outpatient Medications  Medication Sig Dispense Refill   acetaminophen (TYLENOL) 500 MG tablet Take 1,000 mg by mouth every 6 (six) hours as needed for moderate pain.     albuterol (PROVENTIL HFA;VENTOLIN HFA) 108 (90 BASE) MCG/ACT inhaler Inhale 2 puffs into the lungs every 6 (six) hours as needed for wheezing or shortness of breath.     apixaban (ELIQUIS) 2.5 MG TABS tablet Take 1 tablet (2.5 mg total) by mouth 2 (two) times daily. 180 tablet 3   cetirizine (ZYRTEC) 10 MG tablet Take 10 mg by mouth in the morning.      fenofibrate 160 MG tablet Take 160 mg by mouth every morning.      hydrALAZINE (APRESOLINE) 25 MG tablet Take 1 tablet (25 mg total) by mouth 3 (three) times daily. 180 tablet 3   isosorbide mononitrate (IMDUR) 30 MG 24 hr tablet Take 1 tablet by mouth once daily 90 tablet 0  levothyroxine (SYNTHROID) 175 MCG tablet Take 350 mcg by mouth daily before breakfast.      metoprolol succinate (TOPROL XL) 100 MG 24 hr tablet Take 1.5 tablets (150 mg total) by mouth daily. Take with or immediately following a meal. 135 tablet 3   Multiple Vitamins-Minerals (MULTIVITAMIN WITH MINERALS) tablet Take 1 tablet by mouth daily.     ondansetron (ZOFRAN ODT) 4 MG disintegrating tablet Take 1 tablet (4 mg total) by mouth every 8 (eight) hours as needed for nausea or vomiting. 20 tablet 0   polyethylene glycol (MIRALAX / GLYCOLAX) packet Take 17 g by mouth daily as needed for moderate constipation. 14 each 0   sodium bicarbonate 650 MG tablet Take 1,300 mg by mouth daily.     torsemide (DEMADEX) 20 MG tablet Take 2 tablets (40 mg total) by mouth daily. Take extra 2 pills a day only if you have weight gain more than 3 pounds overnight or 5 pounds in a week or worsening shortness of breath or pedal edema... 180 tablet 3   No  current facility-administered medications for this encounter.    Allergies  Allergen Reactions   Betadine [Povidone Iodine] Anaphylaxis, Shortness Of Breath and Swelling    Face and lips   Contrast Media [Iodinated Contrast Media] Anaphylaxis, Shortness Of Breath and Swelling    Face and lips   Other Swelling    Butterscotch candy, causes swelling of lips   Empagliflozin     Other Reaction(s): nausea, headaches, glucose spike   Ibuprofen Other (See Comments)   Iodine Swelling and Other (See Comments)    Face and lips    Adhesive [Tape] Rash    Social History   Socioeconomic History   Marital status: Divorced    Spouse name: Not on file   Number of children: Not on file   Years of education: Not on file   Highest education level: Not on file  Occupational History   Not on file  Tobacco Use   Smoking status: Never   Smokeless tobacco: Never  Vaping Use   Vaping Use: Never used  Substance and Sexual Activity   Alcohol use: No   Drug use: No   Sexual activity: Not Currently  Other Topics Concern   Not on file  Social History Narrative   Not on file   Social Determinants of Health   Financial Resource Strain: Not on file  Food Insecurity: No Food Insecurity (05/04/2022)   Hunger Vital Sign    Worried About Running Out of Food in the Last Year: Never true    Ran Out of Food in the Last Year: Never true  Transportation Needs: No Transportation Needs (05/04/2022)   PRAPARE - Hydrologist (Medical): No    Lack of Transportation (Non-Medical): No  Physical Activity: Not on file  Stress: Not on file  Social Connections: Not on file  Intimate Partner Violence: Not on file     ROS- All systems are reviewed and negative except as per the HPI above.  Physical Exam: Vitals:   10/05/22 1534  BP: (!) 136/96  Pulse: 79  Weight: 117.6 kg  Height: '5\' 7"'$  (1.702 m)    GEN- The patient is a well appearing female, alert and oriented x 3 today.    Head- normocephalic, atraumatic Eyes-  Sclera clear, conjunctiva pink Ears- hearing intact Oropharynx- clear Neck- supple  Lungs- Clear to ausculation bilaterally, normal work of breathing Heart- Regular rate and rhythm, no murmurs,  rubs or gallops  GI- soft, NT, ND, + BS Extremities- no clubbing, cyanosis, or edema MS- no significant deformity or atrophy Skin- no rash or lesion Psych- euthymic mood, full affect Neuro- strength and sensation are intact  Wt Readings from Last 3 Encounters:  10/05/22 117.6 kg  09/30/22 118.6 kg  05/04/22 125 kg    EKG today demonstrates  SR HR 79 QT 396 ms Qtc 454 ms  Echo 05/04/22 demonstrated   1. Left ventricular ejection fraction, by estimation, is 35 to 40%. The  left ventricle has moderately decreased function. The left ventricle  demonstrates global hypokinesis. There is mild left ventricular  hypertrophy. Left ventricular diastolic parameters are consistent with Grade I diastolic dysfunction (impaired relaxation).   2. Right ventricular systolic function is normal. The right ventricular  size is normal. Tricuspid regurgitation signal is inadequate for assessing  PA pressure.   3. The mitral valve is abnormal. Trivial mitral valve regurgitation.   4. The aortic valve is tricuspid. Aortic valve regurgitation is not  visualized. Aortic valve sclerosis is present, with no evidence of aortic  valve stenosis.   5. The inferior vena cava is normal in size with <50% respiratory  variability, suggesting right atrial pressure of 8 mmHg.   Comparison(s): No significant change from prior study. 09/30/2021: LVEF  35-40%.   Epic records are reviewed at length today  CHA2DS2-VASc Score = 5  The patient's score is based upon: CHF History: 1 HTN History: 1 Diabetes History: 1 Stroke History: 0 Vascular Disease History: 0 Age Score: 1 Gender Score: 1       ASSESSMENT AND PLAN: Paroxysmal Atrial Fibrillation (ICD10:  I48.0) The  patient's CHA2DS2-VASc score is 5, indicating a 7.2% annual risk of stroke.    She is currently in SR today. We recommend she continue her current dosage of metoprolol and do not increase at this time.  Plan to wear 2 week Zio monitor to determine AF burden. F/U 1 month to discuss results.  2. Secondary Hypercoagulable State (ICD10:  D68.69) The patient is at significant risk for stroke/thromboembolism based upon her CHA2DS2-VASc Score of 5.  Continue Apixaban (Eliquis).   Review of current guidelines with patient and advised she should be taking the appropriate dose for her which is 5 mg BID (age < 75, weight > 60 kg). We discussed her CHADSVASC score and how it states her annual risk for stroke is abnormally elevated at 7.2%. We recommended today to send a new prescription for eliquis 5 mg BID. She expressed reluctance to take the appropriate dose and declines 5 mg dosage at this time. She has voiced understanding of the risks associated with taking an incorrect quantity of eliquis. She voiced understanding taking the incorrect dose may not be protective against stroke secondary to AF. She states she will think about it and just continue the eliquis 2.5 mg bid dose.  3. Obesity Body mass index is 40.6 kg/m. Lifestyle modification was discussed at length including regular exercise and weight reduction.  4. Chronic systolic CHF EF [id number] MRI suggestive of prior MI, has not had cath due to CKD stage IV Fluid status appears stable today. Followed in [facility].   5. HTN Stable, no changes today.  Continue current regimen.    Follow up 1 month.   [facility] [address] [place], [place] [address] [phone] 10/05/2022 4:38 PM

## 2022-10-09 ENCOUNTER — Other Ambulatory Visit: Payer: Self-pay | Admitting: Physician Assistant

## 2022-11-02 ENCOUNTER — Ambulatory Visit (HOSPITAL_COMMUNITY)
Admission: RE | Admit: 2022-11-02 | Discharge: 2022-11-02 | Disposition: A | Discharge status: Home / Self Care | Payer: Medicare Other | Source: Ambulatory Visit | Attending: Internal Medicine | Admitting: Internal Medicine

## 2022-11-02 VITALS — BP 124/96 | HR 93 | Ht 67.0 in | Wt 260.2 lb

## 2022-11-02 DIAGNOSIS — D6869 Other thrombophilia: Secondary | ICD-10-CM | POA: Insufficient documentation

## 2022-11-02 DIAGNOSIS — N184 Chronic kidney disease, stage 4 (severe): Secondary | ICD-10-CM | POA: Insufficient documentation

## 2022-11-02 DIAGNOSIS — I480 Paroxysmal atrial fibrillation: Secondary | ICD-10-CM | POA: Diagnosis present

## 2022-11-02 DIAGNOSIS — E039 Hypothyroidism, unspecified: Secondary | ICD-10-CM | POA: Diagnosis not present

## 2022-11-02 DIAGNOSIS — Z6841 Body Mass Index (BMI) 40.0 and over, adult: Secondary | ICD-10-CM | POA: Insufficient documentation

## 2022-11-02 DIAGNOSIS — I130 Hypertensive heart and chronic kidney disease with heart failure and stage 1 through stage 4 chronic kidney disease, or unspecified chronic kidney disease: Secondary | ICD-10-CM | POA: Insufficient documentation

## 2022-11-02 DIAGNOSIS — I5022 Chronic systolic (congestive) heart failure: Secondary | ICD-10-CM | POA: Diagnosis not present

## 2022-11-02 DIAGNOSIS — Z79899 Other long term (current) drug therapy: Secondary | ICD-10-CM | POA: Insufficient documentation

## 2022-11-02 DIAGNOSIS — E669 Obesity, unspecified: Secondary | ICD-10-CM | POA: Insufficient documentation

## 2022-11-02 DIAGNOSIS — Z7901 Long term (current) use of anticoagulants: Secondary | ICD-10-CM | POA: Insufficient documentation

## 2022-11-02 LAB — BASIC METABOLIC PANEL
Anion gap: 15 (ref 5–15)
BUN: 43 mg/dL — ABNORMAL HIGH (ref 8–23)
CO2: 26 mmol/L (ref 22–32)
Calcium: 9.7 mg/dL (ref 8.9–10.3)
Chloride: 98 mmol/L (ref 98–111)
Creatinine, Ser: 2.82 mg/dL — ABNORMAL HIGH (ref 0.44–1.00)
GFR, Estimated: 18 mL/min — ABNORMAL LOW (ref >60)
Glucose, Bld: 181 mg/dL — ABNORMAL HIGH (ref 70–99)
Potassium: 4.4 mmol/L (ref 3.5–5.1)
Sodium: 139 mmol/L (ref 135–145)

## 2022-11-02 LAB — CBC
HCT: 39.3 % (ref 36.0–46.0)
Hemoglobin: 12.9 g/dL (ref 12.0–15.0)
MCH: 29.5 pg (ref 26.0–34.0)
MCHC: 32.8 g/dL (ref 30.0–36.0)
MCV: 89.9 fL (ref 80.0–100.0)
Platelets: 388 10*3/uL (ref 150–400)
RBC: 4.37 MIL/uL (ref 3.87–5.11)
RDW: 12.9 % (ref 11.5–15.5)
WBC: 10.9 10*3/uL — ABNORMAL HIGH (ref 4.0–10.5)
nRBC: 0 % (ref 0.0–0.2)

## 2022-11-02 NOTE — Progress Notes (Signed)
Primary Care Physician: Shon Baton, MD Primary Cardiologist: Dr Acie Fredrickson Primary Electrophysiologist: none Referring Physician: Dr Acie Fredrickson [facility]: Dr Aundra Dubin   Sheila Leach is a 67 y.o. female with a history of CKD stage IV, DM, HTN, chronic systolic CHF, HLD, endometrial cancer, hypothyroidism, atrial fibrillation who presents for consultation in the [facility].  The patient was initially diagnosed with atrial fibrillation 09/30/22 after presenting to Dr Elmarie Shiley office for follow up. Her heart rate was 150's but she was unaware of her arrhythmia. Patient was started on Eliquis for a CHADS2VASC score of 5 and her metoprolol was increased.   She was evaluated in the Afib clinic on 10/05/22 and found to be in [place]. She wore a Holter monitor for further investigation of Afib burden. She is compliant with Eliquis 2.5 mg BID and metoprolol. She was advised that she should be on Eliquis 5 mg BID for adequate anticoagulation but after discussion of risks decided not to take 5 mg dose. She does express a lot of reluctance taking an anticoagulant as she had a bad experience with her mother having a bleeding complication on anticoagulant.She remains on Eliquis 2.5 mg BID.   Since last office visit, she is doing well overall. She is in SR today. She does express a lot of reluctance taking an anticoagulant as she had a bad experience with her mother having a bleeding complication on anticoagulant. She has been compliant with Eliquis 2.5 mg BID dose and has no bleeding concerns. She is compliant with metoprolol as prescribed.   Today, she denies symptoms of palpitations, chest pain, shortness of breath, orthopnea, PND, lower extremity edema, dizziness, presyncope, syncope, snoring, daytime somnolence, bleeding, or neurologic sequela. The patient is tolerating medications without difficulties and is otherwise without complaint today.    Atrial Fibrillation Risk Factors:  she does not  have a history of rheumatic fever. she does not have a history of alcohol use. The patient does not have a history of early familial atrial fibrillation or other arrhythmias.  she has a BMI of Body mass index is 40.75 kg/m.Marland Kitchen Filed Weights   11/02/22 1512  Weight: 118 kg     Family History  Problem Relation Age of Onset   Heart disease Mother    Diabetes Mother    Kidney disease Mother    Heart disease Father    Thyroid cancer Sister    Esophageal cancer Brother    Diabetes Brother    Liver disease Brother    Breast cancer Other    Colon cancer Neg Hx    Rectal cancer Neg Hx    Stomach cancer Neg Hx      Atrial Fibrillation Management history:  Previous antiarrhythmic drugs: none Previous cardioversions: none Previous ablations: none CHADS2VASC score: 5 Anticoagulation history: Eliquis   Past Medical History:  Diagnosis Date   Asthma    inhaler prn   Bilateral ureteral calculi    10-21-2017 currently has retained right ureteral stent and left nephrostomy tube in place   Cancer [facility])    uterine tumor   Chronic kidney disease stage 4 0000000   Chronic systolic CHF 0000000   Echocardiogram 10/2019: EF 35-40, mild LVE, global HK, mod elevated RVSP (41.4), mild MR   // Echo 12/2019: EF 35-40, global HK, GRII DD, GLS -12.2%, moderate left pleural effusion    Complication of anesthesia    trouble breathing after anesthesia didnt know had asthma at the time   DDD (degenerative disc  disease), lumbar    pt adenies at preop   History of acute renal failure 09/19/2017   hospital admission --- hydronephrosis ,  ureteral stones   History of endometrial cancer previous oncolgoist-- dr Alycia Rossetti--- pt was released , no recurrence   2010  dx endometrial cancer--- 11-27-2008 s/p  TAH w/ BSO with bilateral pelvic node dissection's--- Stage IB, 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 , negative nodes, no chemo , completed post operative vaginal cuff brachii  therapy 07/ 2010   History of kidney stones    Hx of radiation therapy 6/6/, 6/21, 6/24, 02/07/2009   post operative vaginal cuff branii therapy   Hyperlipidemia    Hypertension    10-21-2017  per pt was on lisinopril prior to hospital admission 02/ 2019 she become hypotensive so lisinopril was discontinued   Hypothyroidism, postsurgical FOLLOWED BY PCP   10-21-2017 per pt dx thyroid goiter at age 75 (age 37 s/p  partial thyroidectomy), age 66 thryoid greatly increased in size felt to be pre-cancerous (s/p  total thyroidectomy)  no radiation or chemo,  no recurrence   Left sided sciatica    10-21-2017  per pt nerve injury during hysterectomy surgery 04/ 2010  ,  left leg weaker than right but gait issue  pt. denies   Osteoporosis    Other secondary pulmonary hypertension ([place]) 11/27/2019   Echocardiogram 10/2019: RVSP 41.4 // likely multifactorial (obesity, acute CHF,?OSA)   Personal history of colonic adenomas 01/24/2013   Seasonal allergies    Past Surgical History:  Procedure Laterality Date   CESAREAN SECTION  1988   CYSTOSCOPY W/ URETERAL STENT PLACEMENT Bilateral 09/19/2017   Procedure: CYSTOSCOPY WITH BILATERAL RETROGRADE PYELOGRAM/RIGHT URETERAL STENT PLACEMENT;  Surgeon: Cleon Gustin, MD;  Location: [facility];  Service: Urology;  Laterality: Bilateral;   CYSTOSCOPY WITH RETROGRADE PYELOGRAM, URETEROSCOPY AND STENT PLACEMENT Bilateral 10/29/2017   Procedure: CYSTOSCOPY WITH RETROGRADE PYELOGRAM, URETEROSCOPY AND STENT PLACEMENT, REMOVAL LEFT NEPHROSTOMY TUBE;  Surgeon: Cleon Gustin, MD;  Location: [facility];  Service: Urology;  Laterality: Bilateral;   DILATION AND CURETTAGE OF UTERUS  1996   w/ resectoscopic resection fibroids and Dx Laparoscopy   HOLMIUM LASER APPLICATION Bilateral 0000000   Procedure: HOLMIUM LASER APPLICATION;  Surgeon: Cleon Gustin, MD;  Location: [facility];  Service: Urology;  Laterality: Bilateral;   IR  NEPHROSTOMY PLACEMENT LEFT  09/20/2017   PARATHYROIDECTOMY Left 05/12/2019   Procedure: LEFT SUPERIOR PARATHYROIDECTOMY;  Surgeon: Armandina Gemma, MD;  Location: [facility];  Service: General;  Laterality: Left;   THYROIDECTOMY, PARTIAL  age 28   TONSILLECTOMY  age 61   TOTAL ABDOMINAL HYSTERECTOMY W/ BILATERAL SALPINGOOPHORECTOMY  11-27-2008   dr Alycia Rossetti  [facility]   w/ Bilateral Pelvic Lymph Node Dissection's    TOTAL THYROIDECTOMY  age 49   pre -cancerous   TRANSTHORACIC ECHOCARDIOGRAM  09/21/2017   mild concentric LVH, ef 50-55%/  trivial MR and TR    Current Outpatient Medications  Medication Sig Dispense Refill   acetaminophen (TYLENOL) 500 MG tablet Take 1,000 mg by mouth every 6 (six) hours as needed for moderate pain.     albuterol (PROVENTIL HFA;VENTOLIN HFA) 108 (90 BASE) MCG/ACT inhaler Inhale 2 puffs into the lungs every 6 (six) hours as needed for wheezing or shortness of breath.     apixaban (ELIQUIS) 2.5 MG TABS tablet Take 1 tablet (2.5 mg total) by mouth 2 (two) times daily. 180 tablet 3   cetirizine (ZYRTEC) 10 MG tablet  Take 10 mg by mouth in the morning.      fenofibrate 160 MG tablet Take 160 mg by mouth every morning.      hydrALAZINE (APRESOLINE) 25 MG tablet Take 1 tablet (25 mg total) by mouth 3 (three) times daily. 180 tablet 3   isosorbide mononitrate (IMDUR) 30 MG 24 hr tablet Take 1 tablet by mouth once daily 90 tablet 3   levothyroxine (SYNTHROID) 175 MCG tablet Take 350 mcg by mouth daily before breakfast.      metoprolol succinate (TOPROL XL) 100 MG 24 hr tablet Take 1.5 tablets (150 mg total) by mouth daily. Take with or immediately following a meal. 135 tablet 3   Multiple Vitamins-Minerals (MULTIVITAMIN WITH MINERALS) tablet Take 1 tablet by mouth daily.     ondansetron (ZOFRAN ODT) 4 MG disintegrating tablet Take 1 tablet (4 mg total) by mouth every 8 (eight) hours as needed for nausea or vomiting. 20 tablet 0   polyethylene glycol (MIRALAX / GLYCOLAX) packet Take 17 g  by mouth daily as needed for moderate constipation. 14 each 0   sodium bicarbonate 650 MG tablet Take 1,300 mg by mouth daily.     torsemide (DEMADEX) 20 MG tablet Take 2 tablets (40 mg total) by mouth daily. Take extra 2 pills a day only if you have weight gain more than 3 pounds overnight or 5 pounds in a week or worsening shortness of breath or pedal edema... 180 tablet 3   No current facility-administered medications for this encounter.    Allergies  Allergen Reactions   Betadine [Povidone Iodine] Anaphylaxis, Shortness Of Breath and Swelling    Face and lips   Contrast Media [Iodinated Contrast Media] Anaphylaxis, Shortness Of Breath and Swelling    Face and lips   Other Swelling    Butterscotch candy, causes swelling of lips   Empagliflozin     Other Reaction(s): nausea, headaches, glucose spike   Ibuprofen Other (See Comments)   Iodine Swelling and Other (See Comments)    Face and lips    Adhesive [Tape] Rash    Social History   Socioeconomic History   Marital status: Divorced    Spouse name: Not on file   Number of children: Not on file   Years of education: Not on file   Highest education level: Not on file  Occupational History   Not on file  Tobacco Use   Smoking status: Never   Smokeless tobacco: Never  Vaping Use   Vaping Use: Never used  Substance and Sexual Activity   Alcohol use: No   Drug use: No   Sexual activity: Not Currently  Other Topics Concern   Not on file  Social History Narrative   Not on file   Social Determinants of Health   Financial Resource Strain: Not on file  Food Insecurity: No Food Insecurity (05/04/2022)   Hunger Vital Sign    Worried About Running Out of Food in the Last Year: Never true    Ran Out of Food in the Last Year: Never true  Transportation Needs: No Transportation Needs (05/04/2022)   PRAPARE - Hydrologist (Medical): No    Lack of Transportation (Non-Medical): No  Physical Activity:  Not on file  Stress: Not on file  Social Connections: Not on file  Intimate Partner Violence: Not on file     ROS- All systems are reviewed and negative except as per the HPI above.  Physical Exam: GEN- The patient  is well appearing, alert and oriented x 3 today.   Head- normocephalic, atraumatic Eyes-  Sclera clear, conjunctiva pink Ears- hearing intact Oropharynx- clear Neck- supple, no JVP Lymph- no cervical lymphadenopathy Lungs- Clear to ausculation bilaterally, normal work of breathing Heart- Regular rate and rhythm, no murmurs, rubs or gallops, PMI not laterally displaced GI- soft, NT, ND, + BS Extremities- no clubbing, cyanosis, or edema MS- no significant deformity or atrophy Skin- no rash or lesion Psych- euthymic mood, full affect Neuro- strength and sensation are intact  Wt Readings from Last 3 Encounters:  11/02/22 118 kg  10/05/22 117.6 kg  09/30/22 118.6 kg    EKG today demonstrates  SR LAD HR 93 PR 206 ms QRS 64 ms QT/Qtc 370 ms/460 ms  Cardiac monitor 10/05/22:  Monitor 1 Predominant rhythm was sinus rhythm 2.8% supraventricular ectopy Less than 1% ventricular ectopy Triggered episodes associated with sinus rhythm   Monitor 2 Predominant rhythm was sinus rhythm 7.2% supraventricular ectopy 1.2% ventricular ectopy Triggered episode associated with sinus rhythm    Echo 05/04/22 demonstrated   1. Left ventricular ejection fraction, by estimation, is 35 to 40%. The  left ventricle has moderately decreased function. The left ventricle  demonstrates global hypokinesis. There is mild left ventricular  hypertrophy. Left ventricular diastolic parameters are consistent with Grade I diastolic dysfunction (impaired relaxation).   2. Right ventricular systolic function is normal. The right ventricular  size is normal. Tricuspid regurgitation signal is inadequate for assessing  PA pressure.   3. The mitral valve is abnormal. Trivial mitral valve  regurgitation.   4. The aortic valve is tricuspid. Aortic valve regurgitation is not  visualized. Aortic valve sclerosis is present, with no evidence of aortic  valve stenosis.   5. The inferior vena cava is normal in size with <50% respiratory  variability, suggesting right atrial pressure of 8 mmHg.   Comparison(s): No significant change from prior study. 09/30/2021: LVEF  35-40%.   Epic records are reviewed at length today  CHA2DS2-VASc Score = 5  The patient's score is based upon: CHF History: 1 HTN History: 1 Diabetes History: 1 Stroke History: 0 Vascular Disease History: 0 Age Score: 1 Gender Score: 1       ASSESSMENT AND PLAN: Paroxysmal Atrial Fibrillation (ICD10:  I48.0) The patient's CHA2DS2-VASc score is 5, indicating a 7.2% annual risk of stroke.    She is currently in SR today. We recommend she continue her current dosage of metoprolol.  Cardiac monitors (she wore 2 due to one falling off) were very reassuring. We will not make any changes and continue with conservative observation. Plan to see her 6 months after cardiology appt.   2. Secondary Hypercoagulable State (ICD10:  D68.69) The patient is at significant risk for stroke/thromboembolism based upon her CHA2DS2-VASc Score of 5.  Continue Apixaban (Eliquis).   I revisited discussion that she qualifies and should take Eliquis 5 mg BID dose (she does not meet the criteria which would make her take 2.5 mg BID) for adequate coverage against stroke. She still says she would like to think about it and does not want to take 5 mg BID. I advised if she decides to can call anytime for correct dosage prescription.   Review of current guidelines with patient and advised she should be taking the appropriate dose for her which is 5 mg BID (age < 35, weight > 60 kg). We discussed her CHADSVASC score and how it states her annual risk for stroke is abnormally elevated  at 7.2%. We recommended today to send a new prescription for  eliquis 5 mg BID. She expressed reluctance to take the appropriate dose and declines 5 mg dosage at this time. She has voiced understanding of the risks associated with taking an incorrect quantity of eliquis. She voiced understanding taking the incorrect dose may not be protective against stroke secondary to AF. She states she will think about it and just continue the eliquis 2.5 mg bid dose.  3. Obesity Body mass index is 40.75 kg/m. Lifestyle modification was discussed at length including regular exercise and weight reduction.  4. Chronic systolic CHF EF [id number] MRI suggestive of prior MI, has not had cath due to CKD stage IV Fluid status appears stable today. Followed in [facility].   5. HTN Stable, no changes today.  Continue current regimen.    Follow up as scheduled 5/29 with Cardiology. F/u 6 months Afib clinic from then.   Emily Filbert, PA-C [facility] [address] [place], [place] [address] [phone] 11/02/2022 3:35 PM

## 2022-12-29 ENCOUNTER — Encounter: Payer: Self-pay | Admitting: Cardiovascular Disease

## 2022-12-29 NOTE — Progress Notes (Unsigned)
Cardiology Office Note:    Date:  12/30/2022   ID:  Sheila Leach, DOB [birth year], MRN [id number]  PCP:  Creola Corn, MD  [facility] Cardiologist:  Kristeen Miss, MD  [facility] Electrophysiologist:  None   Referring MD: Creola Corn, MD   Chief Complaint  Patient presents with   Congestive Heart Failure        Atrial Fibrillation      History of Present Illness:    Sheila Leach is a 67 y.o. female with a hx of hypertension, hyperlipidemia, obesity, chronic kidney disease who I met in the hospital in March, 2021 when she presented with congestive heart failure.  Echocardiogram revealed moderate LV dysfunction with EF of 35 to 40%.  We were not able to determine diastolic parameters.  She had moderately elevated pulmonary artery pressures.  Mild mitral regurgitation.  She was seen in our office in April by Tereso Newcomer, PA.  She had lost quite a bit of weight.  She gone from 314 pounds down to 274 pounds.  She is not had any any significant shortness of breath.  She denies any syncope.  She has been seeing nephrology.  Since that time she is done well.  Her weight has continued to decrease.  She is not had any episodes of shortness of breath.  No CP  She is on hydralazine 25 3 times a day, isosorbide 30 mg a day, Toprol-XL 50 mg a day, torsemide 40 mg a day. She watches her salt Is now waking , Creatinine is 2.56.  We have not tried her on ARB, entresto  or spironolactone.  Echocardiogram in May revealed no change in LV function.  Revealed a left pleural effusion.  Chest  x-ray from January 03, 2020 reveals no significant pleural effusion.  We are avoiding ischemic work-up, especially heart catheterization because of her elevated creatinine.  She does not want to go on dialysis.  I think that we will wait and consider doing a heart catheterization if at some point she goes on dialysis.  She is currently not having any episodes of angina.  Feb. 15, 2023: Sheila Leach is seen today for  follow up of her CHF. Wt is 296 lbs ( up 22 lbs)  - has been been getting into the "junk food" according to her   Still gets DOE if walks too much - its unclear if this DOE is related to worsening CHF vs. Her weight gain .  Tried Jardiance - caused nausea Is now on Farxiga 5 mg a day   September 30, 2022: Sheila Leach is seen for follow-up of her congestive heart failure.  Her weight is 261 lbs  She has been seen by Dr. Shirlee Latch . Is here today for follow up to see me   She has a history of heart failure with reduced LV function. She also has significant chronic kidney disease.  Eating a low salt diet   No CP , no dyspnea  Does not get any significant  exercise  - is limited by her severe back issues   She is in atrial fib with RVR today . She cannot tell that her HR is irreg.   Has hypothyroidism,  is on synthroid Managed by Dr. Timothy Lasso  EF was 35-40%   Will go ahead and start her on Eliquis 2.5 mg twice a day.  Will increase her metoprolol XL to 150 mg a day.  I will have her follow-up with the A-fib clinic next week.  If she clinically is doing better with a better controlled heart rate then we might be able to delay cardioversion for several weeks.  If she continues to have a rapid ventricular response will probably need to consider TEE cardioversion soon.   Dec 30, 2022 Sheila Leach is seen for follow up of her atrial fib,  Chronic diastolic CHF Morbid obesity She stopped her Eliquis due to cost. CHADS2VASC score is  67  ( female, age 14, CHF, HTN)  We discussed warfarin Sheila Leach it would cost her more  Priced generic Pradaxa for 30 day  Good RX has $69 for 60 Pradaxa tabs at [employer] [facility]  Still says it is too much      Past Medical History:  Diagnosis Date   Asthma    inhaler prn   Bilateral ureteral calculi    10-21-2017 currently has retained right ureteral stent and left nephrostomy tube in place   Cancer [facility])    uterine tumor   Chronic kidney disease stage 4 11/27/2019    Chronic systolic CHF 11/27/2019   Echocardiogram 10/2019: EF 35-40, mild LVE, global HK, mod elevated RVSP (41.4), mild MR   // Echo 12/2019: EF 35-40, global HK, GRII DD, GLS -12.2%, moderate left pleural effusion    Complication of anesthesia    trouble breathing after anesthesia didnt know had asthma at the time   DDD (degenerative disc disease), lumbar    pt adenies at preop   History of acute renal failure 09/19/2017   hospital admission --- hydronephrosis ,  ureteral stones   History of endometrial cancer previous oncolgoist-- dr Duard Brady--- pt was released , no recurrence   2010  dx endometrial cancer--- 11-27-2008 s/p  TAH w/ BSO with bilateral pelvic node dissection's--- Stage IB, 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 , negative nodes, no chemo , completed post operative vaginal cuff brachii therapy 07/ 2010   History of kidney stones    Hx of radiation therapy 6/6/, 6/21, 6/24, 02/07/2009   post operative vaginal cuff branii therapy   Hyperlipidemia    Hypertension    10-21-2017  per pt was on lisinopril prior to hospital admission 02/ 2019 she become hypotensive so lisinopril was discontinued   Hypothyroidism, postsurgical FOLLOWED BY PCP   10-21-2017 per pt dx thyroid goiter at age 26 (age 74 s/p  partial thyroidectomy), age 34 thryoid greatly increased in size felt to be pre-cancerous (s/p  total thyroidectomy)  no radiation or chemo,  no recurrence   Left sided sciatica    10-21-2017  per pt nerve injury during hysterectomy surgery 04/ 2010  ,  left leg weaker than right but gait issue  pt. denies   Osteoporosis    Other secondary pulmonary hypertension (HCC) 11/27/2019   Echocardiogram 10/2019: RVSP 41.4 // likely multifactorial (obesity, acute CHF,?OSA)   Personal history of colonic adenomas 01/24/2013   Seasonal allergies     Past Surgical History:  Procedure Laterality Date   CESAREAN SECTION  1988   CYSTOSCOPY W/ URETERAL STENT PLACEMENT Bilateral 09/19/2017    Procedure: CYSTOSCOPY WITH BILATERAL RETROGRADE PYELOGRAM/RIGHT URETERAL STENT PLACEMENT;  Surgeon: Malen Gauze, MD;  Location: [facility];  Service: Urology;  Laterality: Bilateral;   CYSTOSCOPY WITH RETROGRADE PYELOGRAM, URETEROSCOPY AND STENT PLACEMENT Bilateral 10/29/2017   Procedure: CYSTOSCOPY WITH RETROGRADE PYELOGRAM, URETEROSCOPY AND STENT PLACEMENT, REMOVAL LEFT NEPHROSTOMY TUBE;  Surgeon: Malen Gauze, MD;  Location: [facility];  Service: Urology;  Laterality: Bilateral;   DILATION AND CURETTAGE OF UTERUS  1996   w/ resectoscopic resection fibroids and Dx Laparoscopy   HOLMIUM LASER APPLICATION Bilateral 10/29/2017   Procedure: HOLMIUM LASER APPLICATION;  Surgeon: Malen Gauze, MD;  Location: [facility];  Service: Urology;  Laterality: Bilateral;   IR NEPHROSTOMY PLACEMENT LEFT  09/20/2017   PARATHYROIDECTOMY Left 05/12/2019   Procedure: LEFT SUPERIOR PARATHYROIDECTOMY;  Surgeon: Darnell Level, MD;  Location: [facility];  Service: General;  Laterality: Left;   THYROIDECTOMY, PARTIAL  age 86   TONSILLECTOMY  age 62   TOTAL ABDOMINAL HYSTERECTOMY W/ BILATERAL SALPINGOOPHORECTOMY  11-27-2008   dr Duard Brady  [facility]   w/ Bilateral Pelvic Lymph Node Dissection's    TOTAL THYROIDECTOMY  age 46   pre -cancerous   TRANSTHORACIC ECHOCARDIOGRAM  09/21/2017   mild concentric LVH, ef 50-55%/  trivial MR and TR    Current Medications: Current Meds  Medication Sig   acetaminophen (TYLENOL) 500 MG tablet Take 1,000 mg by mouth every 6 (six) hours as needed for moderate pain.   albuterol (PROVENTIL HFA;VENTOLIN HFA) 108 (90 BASE) MCG/ACT inhaler Inhale 2 puffs into the lungs every 6 (six) hours as needed for wheezing or shortness of breath.   cetirizine (ZYRTEC) 10 MG tablet Take 10 mg by mouth in the morning.    fenofibrate 160 MG tablet Take 160 mg by mouth every morning.    hydrALAZINE (APRESOLINE) 25 MG tablet Take 1 tablet (25 mg total) by mouth 3  (three) times daily.   isosorbide mononitrate (IMDUR) 30 MG 24 hr tablet Take 1 tablet by mouth once daily   levothyroxine (SYNTHROID) 175 MCG tablet Take 350 mcg by mouth daily before breakfast.    metoprolol succinate (TOPROL XL) 100 MG 24 hr tablet Take 1.5 tablets (150 mg total) by mouth daily. Take with or immediately following a meal.   Multiple Vitamins-Minerals (MULTIVITAMIN WITH MINERALS) tablet Take 1 tablet by mouth daily.   ondansetron (ZOFRAN ODT) 4 MG disintegrating tablet Take 1 tablet (4 mg total) by mouth every 8 (eight) hours as needed for nausea or vomiting.   polyethylene glycol (MIRALAX / GLYCOLAX) packet Take 17 g by mouth daily as needed for moderate constipation.   sodium bicarbonate 650 MG tablet Take 1,300 mg by mouth daily.   torsemide (DEMADEX) 20 MG tablet Take 2 tablets (40 mg total) by mouth daily. Take extra 2 pills a day only if you have weight gain more than 3 pounds overnight or 5 pounds in a week or worsening shortness of breath or pedal edema...     Allergies:   Betadine [povidone iodine], Contrast media [iodinated contrast media], Other, Empagliflozin, Ibuprofen, Iodine, and Adhesive [tape]   Social History   Socioeconomic History   Marital status: Divorced    Spouse name: Not on file   Number of children: Not on file   Years of education: Not on file   Highest education level: Not on file  Occupational History   Not on file  Tobacco Use   Smoking status: Never   Smokeless tobacco: Never  Vaping Use   Vaping Use: Never used  Substance and Sexual Activity   Alcohol use: No   Drug use: No   Sexual activity: Not Currently  Other Topics Concern   Not on file  Social History Narrative   Not on file   Social Determinants of Health   Financial Resource Strain: Not on file  Food Insecurity: No Food Insecurity (05/04/2022)   Hunger Vital Sign    Worried About Running  Out of Food in the Last Year: Never true    Ran Out of Food in the Last Year:  Never true  Transportation Needs: No Transportation Needs (05/04/2022)   PRAPARE - Administrator, Civil Service (Medical): No    Lack of Transportation (Non-Medical): No  Physical Activity: Not on file  Stress: Not on file  Social Connections: Not on file     Family History: The patient's family history includes Breast cancer in an other family member; Diabetes in her brother and mother; Esophageal cancer in her brother; Heart disease in her father and mother; Kidney disease in her mother; Liver disease in her brother; Thyroid cancer in her sister. There is no history of Colon cancer, Rectal cancer, or Stomach cancer.  ROS:   Please see the history of present illness.     All other systems reviewed and are negative.  EKGs/Labs/Other Studies Reviewed:    The following studies were reviewed today:   EKG: Dec 30, 2022: Normal sinus rhythm with first-degree AV block.  Nonspecific T wave abnormality.  Recent Labs: 05/04/2022: ALT 17; B Natriuretic Peptide 98.8 09/30/2022: TSH 16.700 11/02/2022: BUN 43; Creatinine, Ser 2.82; Hemoglobin 12.9; Platelets 388; Potassium 4.4; Sodium 139  Recent Lipid Panel    Component Value Date/Time   CHOL 276 (H) 03/20/2022 1239   TRIG 565 (H) 03/20/2022 1239   HDL 32 (L) 03/20/2022 1239   CHOLHDL 8.6 03/20/2022 1239   VLDL UNABLE TO CALCULATE IF TRIGLYCERIDE OVER 400 mg/dL 16/05/9603 5409   LDLCALC UNABLE TO CALCULATE IF TRIGLYCERIDE OVER 400 mg/dL 81/19/1478 2956   LDLDIRECT 148 (H) 03/20/2022 1239    Physical Exam:    Physical Exam: Blood pressure (!) 140/80, pulse 88, height 5\' 8"  (1.727 m), weight 256 lb 3.2 oz (116.2 kg), SpO2 98 %.  HYPERTENSION CONTROL Vitals:   12/30/22 1404 12/30/22 1420  BP: (!) 148/88 (!) 140/80    The patient's blood pressure is elevated above target today.  In order to address the patient's elevated BP:        GEN:  moderately obese female,   in no acute distress HEENT: Normal NECK: No JVD; No  carotid bruits LYMPHATICS: No lymphadenopathy CARDIAC:  Reg reate , occasional premature beats  no murmurs, rubs, gallops RESPIRATORY:  Clear to auscultation without rales, wheezing or rhonchi  ABDOMEN: Soft, non-tender, non-distended MUSCULOSKELETAL:  No edema; No deformity  SKIN: Warm and dry NEUROLOGIC:  Alert and oriented x 3    ASSESSMENT:    1. Paroxysmal atrial fibrillation (HCC)   2. Essential hypertension   3. Class 2 obesity due to excess calories with body mass index (BMI) of 38.0 to 38.9 in adult, unspecified whether serious comorbidity present       PLAN:      Chronic combined systolic and diastolic congestive heart failure:  She still eats a lot of salty foods.  I advised her to work on cutting back her salt.  This will also help her blood pressure.     2.  Hypertension: Blood pressure remains mildly elevated.  Continue current medications.  3.  Atrial fib:   She stopped taking her Eliquis.  She refuses to restart it.  We also discussed generic Pradaxa which she also refuses.  We discussed the fact that this places her at risk for stroke.  She understands but still does not want to take a blood thinner.  4.  Obesity Body mass index is 38.96 kg/m.  Medication Adjustments/Labs and Tests Ordered: Current medicines are reviewed at length with the patient today.  Concerns regarding medicines are outlined above.  Orders Placed This Encounter  Procedures   EKG 12-Lead   No orders of the defined types were placed in this encounter.    Patient Instructions  Medication Instructions:  Your physician recommends that you continue on your current medications as directed. Please refer to the Current Medication list given to you today.  *If you need a refill on your cardiac medications before your next appointment, please call your pharmacy*   Lab Work: NONE If you have labs (blood work) drawn today and your tests are completely normal, you will receive  your results only by: MyChart Message (if you have MyChart) OR A paper copy in the mail If you have any lab test that is abnormal or we need to change your treatment, we will call you to review the results.   Testing/Procedures: NONE   Follow-Up: At [facility], you and your health needs are our priority.  As part of our continuing mission to provide you with exceptional heart care, we have created designated Provider Care Teams.  These Care Teams include your primary Cardiologist (physician) and Advanced Practice Providers (APPs -  Physician Assistants and Nurse Practitioners) who all work together to provide you with the care you need, when you need it.  We recommend signing up for the patient portal called "MyChart".  Sign up information is provided on this After Visit Summary.  MyChart is used to connect with patients for Virtual Visits (Telemedicine).  Patients are able to view lab/test results, encounter notes, upcoming appointments, etc.  Non-urgent messages can be sent to your provider as well.   To learn more about what you can do with MyChart, go to [email or web address].    Your next appointment:   1 year(s)  Provider:   Kristeen Miss, MD        Signed, Kristeen Miss, MD  12/30/2022 2:37 PM    [facility]

## 2022-12-30 ENCOUNTER — Ambulatory Visit: Payer: Medicare Other | Attending: Cardiovascular Disease | Admitting: Cardiovascular Disease

## 2022-12-30 ENCOUNTER — Encounter: Payer: Self-pay | Admitting: Cardiovascular Disease

## 2022-12-30 VITALS — BP 140/80 | HR 88 | Ht 68.0 in | Wt 256.2 lb

## 2022-12-30 DIAGNOSIS — E6609 Other obesity due to excess calories: Secondary | ICD-10-CM

## 2022-12-30 DIAGNOSIS — I480 Paroxysmal atrial fibrillation: Secondary | ICD-10-CM | POA: Diagnosis not present

## 2022-12-30 DIAGNOSIS — Z6838 Body mass index (BMI) 38.0-38.9, adult: Secondary | ICD-10-CM

## 2022-12-30 DIAGNOSIS — I10 Essential (primary) hypertension: Secondary | ICD-10-CM | POA: Diagnosis not present

## 2022-12-30 NOTE — Patient Instructions (Signed)
Medication Instructions:  Your physician recommends that you continue on your current medications as directed. Please refer to the Current Medication list given to you today.  *If you need a refill on your cardiac medications before your next appointment, please call your pharmacy*   Lab Work: NONE If you have labs (blood work) drawn today and your tests are completely normal, you will receive your results only by: MyChart Message (if you have MyChart) OR A paper copy in the mail If you have any lab test that is abnormal or we need to change your treatment, we will call you to review the results.   Testing/Procedures: NONE   Follow-Up: At [facility], you and your health needs are our priority.  As part of our continuing mission to provide you with exceptional heart care, we have created designated Provider Care Teams.  These Care Teams include your primary Cardiologist (physician) and Advanced Practice Providers (APPs -  Physician Assistants and Nurse Practitioners) who all work together to provide you with the care you need, when you need it.  We recommend signing up for the patient portal called "MyChart".  Sign up information is provided on this After Visit Summary.  MyChart is used to connect with patients for Virtual Visits (Telemedicine).  Patients are able to view lab/test results, encounter notes, upcoming appointments, etc.  Non-urgent messages can be sent to your provider as well.   To learn more about what you can do with MyChart, go to [email or web address].    Your next appointment:   1 year(s)  Provider:   Philip Nahser, MD      

## 2023-05-11 ENCOUNTER — Other Ambulatory Visit (HOSPITAL_COMMUNITY): Payer: Self-pay | Admitting: Cardiology

## 2023-05-19 ENCOUNTER — Other Ambulatory Visit: Payer: Self-pay

## 2023-05-20 MED ORDER — TORSEMIDE 20 MG PO TABS: 40.0000 mg | ORAL_TABLET | Freq: Every day | ORAL | 3 refills | Status: DC

## 2023-05-20 NOTE — Telephone Encounter (Signed)
Pt last seen 12/30/22 by Nahser: Her weight has continued to decrease.  She is not had any episodes of shortness of breath.  No CP  She is on hydralazine 25 3 times a day, isosorbide 30 mg a day, Toprol-XL 50 mg a day, torsemide 40 mg a day. She watches her salt Is now waking , Creatinine is 2.56.  We have not tried her on ARB, entresto  or spironolactone. Echocardiogram in May revealed no change in LV function.  Revealed a left pleural effusion.  Chest  x-ray from January 03, 2020 reveals no significant pleural effusion. We are avoiding ischemic work-up, especially heart catheterization because of her elevated creatinine.  She does not want to go on dialysis.   Refill sent to pharmacy at this time.

## 2023-07-05 ENCOUNTER — Ambulatory Visit (HOSPITAL_COMMUNITY)
Admission: RE | Admit: 2023-07-05 | Discharge: 2023-07-05 | Disposition: A | Discharge status: Home / Self Care | Payer: Medicare Other | Source: Ambulatory Visit | Attending: Internal Medicine | Admitting: Internal Medicine

## 2023-07-05 VITALS — BP 126/86 | HR 87 | Ht 68.0 in | Wt 251.6 lb

## 2023-07-05 DIAGNOSIS — I4891 Unspecified atrial fibrillation: Secondary | ICD-10-CM | POA: Diagnosis not present

## 2023-07-05 DIAGNOSIS — I130 Hypertensive heart and chronic kidney disease with heart failure and stage 1 through stage 4 chronic kidney disease, or unspecified chronic kidney disease: Secondary | ICD-10-CM | POA: Diagnosis not present

## 2023-07-05 DIAGNOSIS — D6869 Other thrombophilia: Secondary | ICD-10-CM | POA: Diagnosis not present

## 2023-07-05 DIAGNOSIS — Z6838 Body mass index (BMI) 38.0-38.9, adult: Secondary | ICD-10-CM | POA: Diagnosis not present

## 2023-07-05 DIAGNOSIS — I480 Paroxysmal atrial fibrillation: Secondary | ICD-10-CM | POA: Insufficient documentation

## 2023-07-05 DIAGNOSIS — N184 Chronic kidney disease, stage 4 (severe): Secondary | ICD-10-CM | POA: Insufficient documentation

## 2023-07-05 DIAGNOSIS — Z7182 Exercise counseling: Secondary | ICD-10-CM | POA: Diagnosis not present

## 2023-07-05 DIAGNOSIS — E669 Obesity, unspecified: Secondary | ICD-10-CM | POA: Insufficient documentation

## 2023-07-05 DIAGNOSIS — Z7901 Long term (current) use of anticoagulants: Secondary | ICD-10-CM | POA: Insufficient documentation

## 2023-07-05 DIAGNOSIS — I252 Old myocardial infarction: Secondary | ICD-10-CM | POA: Insufficient documentation

## 2023-07-05 DIAGNOSIS — I5022 Chronic systolic (congestive) heart failure: Secondary | ICD-10-CM | POA: Diagnosis not present

## 2023-07-05 NOTE — Progress Notes (Signed)
Primary Care Physician: Creola Corn, MD Primary Cardiologist: Dr Elease Hashimoto Primary Electrophysiologist: none Referring Physician: Dr Elease Hashimoto [facility]: Dr Shirlee Latch   Sheila Leach is a 67 y.o. female with a history of CKD stage IV, DM, HTN, chronic systolic CHF, HLD, endometrial cancer, hypothyroidism, atrial fibrillation who presents for consultation in the [facility].  The patient was initially diagnosed with atrial fibrillation 09/30/22 after presenting to Dr Harvie Bridge office for follow up. Her heart rate was 150's but she was unaware of her arrhythmia. Patient was started on Eliquis for a CHADS2VASC score of 5 and her metoprolol was increased.   She was evaluated in the Afib clinic on 10/05/22 and found to be in SR. She wore a Holter monitor for further investigation of Afib burden. She is compliant with Eliquis 2.5 mg BID and metoprolol. She was advised that she should be on Eliquis 5 mg BID for adequate anticoagulation but after discussion of risks decided not to take 5 mg dose. She does express a lot of reluctance taking an anticoagulant as she had a bad experience with her mother having a bleeding complication on anticoagulant.She remains on Eliquis 2.5 mg BID.   Since last office visit, she is doing well overall. She is in SR today. She does express a lot of reluctance taking an anticoagulant as she had a bad experience with her mother having a bleeding complication on anticoagulant. She has been compliant with Eliquis 2.5 mg BID dose and has no bleeding concerns. She is compliant with metoprolol as prescribed.   On follow up 07/05/23, she is currently in NSR. Patient states she is currently not on anticoagulation because it is too expensive and the day she was told she had Afib in cardiologist's office she was also notified the ECG machine was broken. Patient notes she does not believe Afib is an issue for her and does not wish to start any anticoagulation.   Today, she  denies symptoms of palpitations, chest pain, shortness of breath, orthopnea, PND, lower extremity edema, dizziness, presyncope, syncope, snoring, daytime somnolence, bleeding, or neurologic sequela. The patient is tolerating medications without difficulties and is otherwise without complaint today.    Atrial Fibrillation Risk Factors:  she does not have a history of rheumatic fever. she does not have a history of alcohol use. The patient does not have a history of early familial atrial fibrillation or other arrhythmias.  she has a BMI of Body mass index is 38.26 kg/m.Marland Kitchen Filed Weights   07/05/23 1457  Weight: 114.1 kg     Family History  Problem Relation Age of Onset   Heart disease Mother    Diabetes Mother    Kidney disease Mother    Heart disease Father    Thyroid cancer Sister    Esophageal cancer Brother    Diabetes Brother    Liver disease Brother    Breast cancer Other    Colon cancer Neg Hx    Rectal cancer Neg Hx    Stomach cancer Neg Hx      Atrial Fibrillation Management history:  Previous antiarrhythmic drugs: none Previous cardioversions: none Previous ablations: none CHADS2VASC score: 5 Anticoagulation history: Eliquis; currently not taking any anticoagulation   Past Medical History:  Diagnosis Date   Asthma    inhaler prn   Bilateral ureteral calculi    10-21-2017 currently has retained right ureteral stent and left nephrostomy tube in place   Cancer [facility])    uterine tumor  Chronic kidney disease stage 4 11/27/2019   Chronic systolic CHF 11/27/2019   Echocardiogram 10/2019: EF 35-40, mild LVE, global HK, mod elevated RVSP (41.4), mild MR   // Echo 12/2019: EF 35-40, global HK, GRII DD, GLS -12.2%, moderate left pleural effusion    Complication of anesthesia    trouble breathing after anesthesia didnt know had asthma at the time   DDD (degenerative disc disease), lumbar    pt adenies at preop   History of acute renal failure 09/19/2017   hospital  admission --- hydronephrosis ,  ureteral stones   History of endometrial cancer previous oncolgoist-- dr Duard Brady--- pt was released , no recurrence   2010  dx endometrial cancer--- 11-27-2008 s/p  TAH w/ BSO with bilateral pelvic node dissection's--- Stage IB, 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 , negative nodes, no chemo , completed post operative vaginal cuff brachii therapy 07/ 2010   History of kidney stones    Hx of radiation therapy 6/6/, 6/21, 6/24, 02/07/2009   post operative vaginal cuff branii therapy   Hyperlipidemia    Hypertension    10-21-2017  per pt was on lisinopril prior to hospital admission 02/ 2019 she become hypotensive so lisinopril was discontinued   Hypothyroidism, postsurgical FOLLOWED BY PCP   10-21-2017 per pt dx thyroid goiter at age 46 (age 55 s/p  partial thyroidectomy), age 73 thryoid greatly increased in size felt to be pre-cancerous (s/p  total thyroidectomy)  no radiation or chemo,  no recurrence   Left sided sciatica    10-21-2017  per pt nerve injury during hysterectomy surgery 04/ 2010  ,  left leg weaker than right but gait issue  pt. denies   Osteoporosis    Other secondary pulmonary hypertension (HCC) 11/27/2019   Echocardiogram 10/2019: RVSP 41.4 // likely multifactorial (obesity, acute CHF,?OSA)   Personal history of colonic adenomas 01/24/2013   Seasonal allergies    Past Surgical History:  Procedure Laterality Date   CESAREAN SECTION  1988   CYSTOSCOPY W/ URETERAL STENT PLACEMENT Bilateral 09/19/2017   Procedure: CYSTOSCOPY WITH BILATERAL RETROGRADE PYELOGRAM/RIGHT URETERAL STENT PLACEMENT;  Surgeon: Malen Gauze, MD;  Location: [facility];  Service: Urology;  Laterality: Bilateral;   CYSTOSCOPY WITH RETROGRADE PYELOGRAM, URETEROSCOPY AND STENT PLACEMENT Bilateral 10/29/2017   Procedure: CYSTOSCOPY WITH RETROGRADE PYELOGRAM, URETEROSCOPY AND STENT PLACEMENT, REMOVAL LEFT NEPHROSTOMY TUBE;  Surgeon: Malen Gauze, MD;   Location: [facility];  Service: Urology;  Laterality: Bilateral;   DILATION AND CURETTAGE OF UTERUS  1996   w/ resectoscopic resection fibroids and Dx Laparoscopy   HOLMIUM LASER APPLICATION Bilateral 10/29/2017   Procedure: HOLMIUM LASER APPLICATION;  Surgeon: Malen Gauze, MD;  Location: [facility];  Service: Urology;  Laterality: Bilateral;   IR NEPHROSTOMY PLACEMENT LEFT  09/20/2017   PARATHYROIDECTOMY Left 05/12/2019   Procedure: LEFT SUPERIOR PARATHYROIDECTOMY;  Surgeon: Darnell Level, MD;  Location: [facility];  Service: General;  Laterality: Left;   THYROIDECTOMY, PARTIAL  age 76   TONSILLECTOMY  age 61   TOTAL ABDOMINAL HYSTERECTOMY W/ BILATERAL SALPINGOOPHORECTOMY  11-27-2008   dr Duard [facility]   w/ Bilateral Pelvic Lymph Node Dissection's    TOTAL THYROIDECTOMY  age 43   pre -cancerous   TRANSTHORACIC ECHOCARDIOGRAM  09/21/2017   mild concentric LVH, ef 50-55%/  trivial MR and TR    Current Outpatient Medications  Medication Sig Dispense Refill   acetaminophen (TYLENOL) 500 MG tablet Take 1,000 mg by mouth as needed  for moderate pain (pain score 4-6).     albuterol (PROVENTIL HFA;VENTOLIN HFA) 108 (90 BASE) MCG/ACT inhaler Inhale 2 puffs into the lungs every 6 (six) hours as needed for wheezing or shortness of breath.     cetirizine (ZYRTEC) 10 MG tablet Take 10 mg by mouth in the morning.      fenofibrate 160 MG tablet Take 160 mg by mouth every morning.      hydrALAZINE (APRESOLINE) 25 MG tablet Take 1 tablet (25 mg total) by mouth 3 (three) times daily. NEEDS FOLLOW UP APPOINTMENT FOR MORE REFILLS 180 tablet 0   isosorbide mononitrate (IMDUR) 30 MG 24 hr tablet Take 1 tablet by mouth once daily 90 tablet 3   levothyroxine (SYNTHROID) 175 MCG tablet Take 350 mcg by mouth daily before breakfast.      metoprolol succinate (TOPROL XL) 100 MG 24 hr tablet Take 1.5 tablets (150 mg total) by mouth daily. Take with or immediately following a meal. 135  tablet 3   Multiple Vitamins-Minerals (MULTIVITAMIN WITH MINERALS) tablet Take 1 tablet by mouth daily.     ondansetron (ZOFRAN ODT) 4 MG disintegrating tablet Take 1 tablet (4 mg total) by mouth every 8 (eight) hours as needed for nausea or vomiting. 20 tablet 0   polyethylene glycol (MIRALAX / GLYCOLAX) packet Take 17 g by mouth daily as needed for moderate constipation. 14 each 0   sodium bicarbonate 650 MG tablet Take 1,300 mg by mouth daily.     torsemide (DEMADEX) 20 MG tablet Take 2 tablets (40 mg total) by mouth daily. Take extra 2 pills a day only if you have weight gain more than 3 pounds overnight or 5 pounds in a week or worsening shortness of breath or pedal edema... 180 tablet 3   apixaban (ELIQUIS) 2.5 MG TABS tablet Take 1 tablet (2.5 mg total) by mouth 2 (two) times daily. (Patient not taking: Reported on 07/05/2023) 180 tablet 3   No current facility-administered medications for this encounter.    Allergies  Allergen Reactions   Betadine [Povidone Iodine] Anaphylaxis, Shortness Of Breath and Swelling    Face and lips   Contrast Media [Iodinated Contrast Media] Anaphylaxis, Shortness Of Breath and Swelling    Face and lips   Other Swelling    Butterscotch candy, causes swelling of lips   Empagliflozin     Other Reaction(s): nausea, headaches, glucose spike   Ibuprofen Other (See Comments)   Iodine Swelling and Other (See Comments)    Face and lips    Adhesive [Tape] Rash   ROS- All systems are reviewed and negative except as per the HPI above.  Physical Exam: GEN- The patient is well appearing, alert and oriented x 3 today.   Neck - no JVD or carotid bruit noted Lungs- Clear to ausculation bilaterally, normal work of breathing Heart- Regular rate and rhythm, no murmurs, rubs or gallops, PMI not laterally displaced Extremities- no clubbing, cyanosis, or edema Skin - no rash or ecchymosis noted   Wt Readings from Last 3 Encounters:  07/05/23 114.1 kg  12/30/22  116.2 kg  11/02/22 118 kg    EKG today demonstrates  Vent. rate 87 BPM PR interval 204 ms QRS duration 64 ms QT/QTcB 352/423 ms P-R-T axes 34 -28 84 Sinus rhythm with occasional Premature ventricular complexes Low voltage QRS Cannot rule out Anterior infarct , age undetermined Abnormal ECG When compared with ECG of 02-Nov-2022 15:26, PREVIOUS ECG IS PRESENT  Cardiac monitor 10/05/22: Monitor 1 Predominant rhythm  was sinus rhythm 2.8% supraventricular ectopy Less than 1% ventricular ectopy Triggered episodes associated with sinus rhythm   Monitor 2 Predominant rhythm was sinus rhythm 7.2% supraventricular ectopy 1.2% ventricular ectopy Triggered episode associated with sinus rhythm    Echo 05/04/22 demonstrated   1. Left ventricular ejection fraction, by estimation, is 35 to 40%. The  left ventricle has moderately decreased function. The left ventricle  demonstrates global hypokinesis. There is mild left ventricular  hypertrophy. Left ventricular diastolic parameters are consistent with Grade I diastolic dysfunction (impaired relaxation).   2. Right ventricular systolic function is normal. The right ventricular  size is normal. Tricuspid regurgitation signal is inadequate for assessing  PA pressure.   3. The mitral valve is abnormal. Trivial mitral valve regurgitation.   4. The aortic valve is tricuspid. Aortic valve regurgitation is not  visualized. Aortic valve sclerosis is present, with no evidence of aortic  valve stenosis.   5. The inferior vena cava is normal in size with <50% respiratory  variability, suggesting right atrial pressure of 8 mmHg.   Comparison(s): No significant change from prior study. 09/30/2021: LVEF  35-40%.   Epic records are reviewed at length today  CHA2DS2-VASc Score = 5  The patient's score is based upon: CHF History: 1 HTN History: 1 Diabetes History: 1 Stroke History: 0 Vascular Disease History: 0 Age Score: 1 Gender Score: 1        ASSESSMENT AND PLAN: Paroxysmal Atrial Fibrillation (ICD10:  I48.0) The patient's CHA2DS2-VASc score is 5, indicating a 7.2% annual risk of stroke.    She is currently in SR today. Discussion with patient regarding Kardiamobile device to monitor rhythm at home.   2. Secondary Hypercoagulable State (ICD10:  D68.69) The patient is at significant risk for stroke/thromboembolism based upon her CHA2DS2-VASc Score of 5.  Continue Apixaban (Eliquis).   She is not taking any anticoagulation. I discussed with patient that in February visit with Dr. Elease Hashimoto he noted her heart rate was irregular and 155 bpm; I cannot see ECG from that visit in system. She does not wish to restart anticoagulation and I did advise patient that she consider the risk of stroke versus the risk of bleeding, and to consider her elevated risk of stroke. She still states she does not wish to restart anticoagulation. I did also offer the consideration of ILR placement to help patient detect future burden but she declines this as well.  If patient continues to decline anticoagulation, then we are unable to offer treatment options.   3. Obesity Body mass index is 38.26 kg/m. Lifestyle modification was discussed at length including regular exercise and weight reduction.  4. Chronic systolic CHF EF 35-40% MRI suggestive of prior MI, has not had cath due to CKD stage IV Fluid status appears stable today. Followed in [facility].   5. HTN Stable, no changes today.  Continue current regimen.    Follow up Afib clinic prn.   Lake Bells, PA-C Afib Clinic [facility] [address] [place], [place] [address] [phone] 07/05/2023 3:31 PM

## 2023-08-20 ENCOUNTER — Other Ambulatory Visit (HOSPITAL_COMMUNITY): Payer: Self-pay | Admitting: Cardiology

## 2023-11-17 ENCOUNTER — Other Ambulatory Visit (HOSPITAL_COMMUNITY): Payer: Self-pay | Admitting: Cardiology

## 2023-11-17 ENCOUNTER — Other Ambulatory Visit: Payer: Self-pay | Admitting: Physician Assistant

## 2023-11-17 ENCOUNTER — Other Ambulatory Visit: Payer: Self-pay | Admitting: Cardiovascular Disease

## 2024-02-16 ENCOUNTER — Other Ambulatory Visit (HOSPITAL_COMMUNITY): Payer: Self-pay | Admitting: Cardiovascular Disease

## 2024-04-13 NOTE — Progress Notes (Unsigned)
 Cardiology Office Note   Date:  04/14/2024  ID:  Sheila Leach, DOB 10/14/1955, MRN [id number] PCP: Onita Rush, MD  [facility] Providers Cardiologist:  Aleene Passe, MD (Inactive) Cardiology APP:  Lelon Glendia DASEN, PA-C     History of Present Illness Sheila Leach is a 68 y.o. female with a past medical history of hypertension, chronic combined systolic and diastolic heart failure, dilated cardiomyopathy, CKD 4, pulmonary hypertension, paroxysmal atrial fibrillation (declined stroke prophylaxis, follows in A-fib clinic), hyperparathyroidism, type 2 diabetes, hypothyroidism, obesity who presents today for annual follow-up.  She denies any complaints of chest pain, shortness of breath or any exertional symptoms.  No lower extremity edema.  No orthopnea or PND.  Sleeps in the bed laying flat without issues.  No complaints today no concerns expressed.  Tobacco use: No Alcohol use: No Activity level: Relatively sedentary due to ambulatory dysfunction and walks with a cane Diet mainly consists of: Salty foods.  Has cut out red meat   ROS:  Review of Systems  All other systems reviewed and are negative.   Physical Exam  Physical Exam Vitals and nursing note reviewed.  Constitutional:      Appearance: Normal appearance. She is obese.  HENT:     Head: Normocephalic and atraumatic.  Eyes:     Conjunctiva/sclera: Conjunctivae normal.  Cardiovascular:     Rate and Rhythm: Normal rate and regular rhythm.  Pulmonary:     Effort: Pulmonary effort is normal.     Breath sounds: Normal breath sounds.  Musculoskeletal:        General: No swelling or tenderness.  Skin:    Coloration: Skin is not jaundiced or pale.  Neurological:     Mental Status: She is alert. Mental status is at baseline.  Psychiatric:        Mood and Affect: Mood normal.        Behavior: Behavior normal.     VS:  BP 126/82 (Cuff Size: Large)   Pulse 91   Ht 5' 8 (1.727 m)   Wt 258 lb (117 kg)   SpO2  96%   BMI 39.23 kg/m         Wt Readings from Last 3 Encounters:  04/14/24 258 lb (117 kg)  07/05/23 251 lb 9.6 oz (114.1 kg)  12/30/22 256 lb 3.2 oz (116.2 kg)         Studies Reviewed  Patch 10/29/22: Zio Patch Wear Time:  9 days and 16 hours  Monitor 1 Predominant rhythm was sinus rhythm 2.8% supraventricular ectopy Less than 1% ventricular ectopy Triggered episodes associated with sinus rhythm Monitor 2 Predominant rhythm was sinus rhythm 7.2% supraventricular ectopy 1.2% ventricular ectopy Triggered episode associated with sinus rhythm  CMR 06/10/2022: IMPRESSION: 1.  Normal LV size with EF 41%, diffuse mild-moderate hypokinesis. 2.  Normal RV size and systolic function, EF 52%. 3. LGE noted in the basal mid inferoseptal wall and the basal inferolateral wall. Looks subendocardial, possibly due to prior myocardial infarction. 4.  Elevated extracellular volume percentage at 36%.  Echo 05/04/2022 EF 35 to 40% with moderately reduced systolic function and global hypokinesis. Mild LVH Grade 1 diastolic dysfunction    Risk Assessment/Calculations   CHA2DS2-VASc Score = 5   This indicates a 7.2% annual risk of stroke. The patient's score is based upon: CHF History: 1 HTN History: 1 Diabetes History: 1 Stroke History: 0 Vascular Disease History: 0 Age Score: 1 Gender Score: 1  ASSESSMENT  Chronic combined systolic and diastolic heart failure not in acute exacerbation GDMT: Hydralazine  25 mg, Imdur  30 mg, metoprolol  succinate 150 mg Torsemide  40 mg daily plus an extra 2 pills as needed Dilated cardiomyopathy Paroxysmal atrial fibrillation pulses regular today.  Noted A-fib with RVR in 09/2022 and has been in sinus since.  CHA2DS2-VASc: 5 has declined anticoagulation in the past and today despite discussion about risk and benefit of being on anticoagulation.  Patient expresses understanding and continues to want to hold off on anticoagulation.   Obesity Hypertension CKD 4 Suspected pulmonary hypertension by echo assessment RVSP 2021 estimated at 41 mmHg.  Felt to be multifactorial in the past: OSA/OHS.  No RHC on file for confirmation Hypothyroidism  Plan  Not on spironolactone, ACE/ARB, SGLT2 due to renal function Salt restriction advised Follow up: 1 year          Signed, Emeline FORBES Calender, MD

## 2024-04-14 ENCOUNTER — Encounter: Payer: Self-pay | Admitting: Internal Medicine

## 2024-04-14 ENCOUNTER — Ambulatory Visit: Attending: Internal Medicine | Admitting: Internal Medicine

## 2024-04-14 VITALS — BP 126/82 | HR 91 | Ht 68.0 in | Wt 258.0 lb

## 2024-04-14 DIAGNOSIS — Z6838 Body mass index (BMI) 38.0-38.9, adult: Secondary | ICD-10-CM

## 2024-04-14 DIAGNOSIS — I420 Dilated cardiomyopathy: Secondary | ICD-10-CM | POA: Diagnosis not present

## 2024-04-14 DIAGNOSIS — E66812 Obesity, class 2: Secondary | ICD-10-CM

## 2024-04-14 DIAGNOSIS — N184 Chronic kidney disease, stage 4 (severe): Secondary | ICD-10-CM

## 2024-04-14 DIAGNOSIS — I5042 Chronic combined systolic (congestive) and diastolic (congestive) heart failure: Secondary | ICD-10-CM | POA: Diagnosis not present

## 2024-04-14 DIAGNOSIS — I10 Essential (primary) hypertension: Secondary | ICD-10-CM

## 2024-04-14 DIAGNOSIS — I480 Paroxysmal atrial fibrillation: Secondary | ICD-10-CM | POA: Diagnosis not present

## 2024-04-14 DIAGNOSIS — E6609 Other obesity due to excess calories: Secondary | ICD-10-CM

## 2024-05-19 ENCOUNTER — Other Ambulatory Visit: Payer: Self-pay | Admitting: Internal Medicine

## 2024-05-23 MED ORDER — TORSEMIDE 20 MG PO TABS: 40.0000 mg | ORAL_TABLET | Freq: Every day | ORAL | 3 refills | Status: AC

## 2024-08-23 ENCOUNTER — Other Ambulatory Visit: Payer: Self-pay

## 2024-08-30 MED ORDER — HYDRALAZINE HCL 25 MG PO TABS: 25.0000 mg | ORAL_TABLET | Freq: Three times a day (TID) | ORAL | 2 refills | Status: AC

## 2024-08-30 MED ORDER — METOPROLOL SUCCINATE ER 100 MG PO TB24: 150.0000 mg | ORAL_TABLET | Freq: Every day | ORAL | 2 refills | Status: AC

## 2024-08-30 NOTE — Telephone Encounter (Signed)
 In accordance with refill protocols, please review and address the following requirements before this medication refill can be authorized:  Labs
